# Patient Record
Sex: Female | Born: 1975 | Race: White | Hispanic: No | Marital: Married | Smoking: Never smoker
Health system: Southern US, Community
[De-identification: ages and names within clinical notes are randomized; demographics above are authoritative.]

## PROBLEM LIST (undated history)

## (undated) DIAGNOSIS — E119 Type 2 diabetes mellitus without complications: Secondary | ICD-10-CM

## (undated) DIAGNOSIS — L409 Psoriasis, unspecified: Secondary | ICD-10-CM

## (undated) DIAGNOSIS — M797 Fibromyalgia: Secondary | ICD-10-CM

## (undated) DIAGNOSIS — G43909 Migraine, unspecified, not intractable, without status migrainosus: Secondary | ICD-10-CM

## (undated) DIAGNOSIS — S82899A Other fracture of unspecified lower leg, initial encounter for closed fracture: Secondary | ICD-10-CM

## (undated) DIAGNOSIS — F32A Depression, unspecified: Secondary | ICD-10-CM

## (undated) DIAGNOSIS — R252 Cramp and spasm: Secondary | ICD-10-CM

## (undated) DIAGNOSIS — F329 Major depressive disorder, single episode, unspecified: Secondary | ICD-10-CM

## (undated) HISTORY — DX: Fibromyalgia: M79.7

## (undated) HISTORY — PX: ABDOMINAL HYSTERECTOMY: SHX81

## (undated) HISTORY — PX: GASTRIC BYPASS: SHX52

## (undated) HISTORY — DX: Major depressive disorder, single episode, unspecified: F32.9

## (undated) HISTORY — DX: Other fracture of unspecified lower leg, initial encounter for closed fracture: S82.899A

## (undated) HISTORY — DX: Cramp and spasm: R25.2

## (undated) HISTORY — DX: Type 2 diabetes mellitus without complications: E11.9

## (undated) HISTORY — DX: Psoriasis, unspecified: L40.9

## (undated) HISTORY — PX: CHOLECYSTECTOMY: SHX55

## (undated) HISTORY — PX: OTHER SURGICAL HISTORY: SHX169

## (undated) HISTORY — PX: FACET JOINT INJECTION: SHX5016

## (undated) HISTORY — DX: Migraine, unspecified, not intractable, without status migrainosus: G43.909

## (undated) HISTORY — DX: Depression, unspecified: F32.A

---

## 2006-12-17 ENCOUNTER — Ambulatory Visit: Payer: Self-pay | Admitting: Physical Medicine & Rehabilitation

## 2006-12-17 ENCOUNTER — Encounter
Admission: RE | Admit: 2006-12-17 | Discharge: 2006-12-18 | Payer: Self-pay | Admitting: Physical Medicine & Rehabilitation

## 2007-01-19 ENCOUNTER — Ambulatory Visit: Payer: Self-pay | Admitting: Physical Medicine & Rehabilitation

## 2007-01-20 ENCOUNTER — Encounter
Admission: RE | Admit: 2007-01-20 | Discharge: 2007-04-20 | Payer: Self-pay | Admitting: Physical Medicine & Rehabilitation

## 2007-02-18 ENCOUNTER — Ambulatory Visit: Payer: Self-pay | Admitting: Physical Medicine & Rehabilitation

## 2007-03-23 ENCOUNTER — Ambulatory Visit: Payer: Self-pay | Admitting: Physical Medicine & Rehabilitation

## 2007-04-23 ENCOUNTER — Ambulatory Visit: Payer: Self-pay | Admitting: Physical Medicine & Rehabilitation

## 2007-04-23 ENCOUNTER — Encounter
Admission: RE | Admit: 2007-04-23 | Discharge: 2007-07-22 | Payer: Self-pay | Admitting: Physical Medicine & Rehabilitation

## 2007-06-18 ENCOUNTER — Ambulatory Visit: Payer: Self-pay | Admitting: Physical Medicine & Rehabilitation

## 2007-07-19 ENCOUNTER — Encounter
Admission: RE | Admit: 2007-07-19 | Discharge: 2007-07-21 | Payer: Self-pay | Admitting: Physical Medicine & Rehabilitation

## 2007-07-21 ENCOUNTER — Ambulatory Visit: Payer: Self-pay | Admitting: Physical Medicine & Rehabilitation

## 2007-08-20 ENCOUNTER — Encounter: Admission: RE | Admit: 2007-08-20 | Discharge: 2007-08-24 | Payer: Self-pay | Admitting: Anesthesiology

## 2007-08-24 ENCOUNTER — Ambulatory Visit: Payer: Self-pay | Admitting: Anesthesiology

## 2007-09-30 ENCOUNTER — Encounter
Admission: RE | Admit: 2007-09-30 | Discharge: 2007-10-28 | Payer: Self-pay | Admitting: Physical Medicine & Rehabilitation

## 2007-10-01 ENCOUNTER — Ambulatory Visit: Payer: Self-pay | Admitting: Physical Medicine & Rehabilitation

## 2007-10-28 ENCOUNTER — Ambulatory Visit: Payer: Self-pay | Admitting: Physical Medicine & Rehabilitation

## 2008-01-06 ENCOUNTER — Encounter
Admission: RE | Admit: 2008-01-06 | Discharge: 2008-04-05 | Payer: Self-pay | Admitting: Physical Medicine & Rehabilitation

## 2008-01-10 ENCOUNTER — Ambulatory Visit: Payer: Self-pay | Admitting: Physical Medicine & Rehabilitation

## 2008-02-22 ENCOUNTER — Ambulatory Visit: Payer: Self-pay | Admitting: Physical Medicine & Rehabilitation

## 2008-02-28 ENCOUNTER — Ambulatory Visit: Payer: Self-pay | Admitting: Physical Medicine & Rehabilitation

## 2008-03-02 ENCOUNTER — Encounter
Admission: RE | Admit: 2008-03-02 | Discharge: 2008-03-03 | Payer: Self-pay | Admitting: Physical Medicine & Rehabilitation

## 2008-03-03 ENCOUNTER — Ambulatory Visit: Payer: Self-pay | Admitting: Physical Medicine & Rehabilitation

## 2008-04-03 ENCOUNTER — Encounter
Admission: RE | Admit: 2008-04-03 | Discharge: 2008-04-04 | Payer: Self-pay | Admitting: Physical Medicine & Rehabilitation

## 2008-04-04 ENCOUNTER — Ambulatory Visit: Payer: Self-pay | Admitting: Physical Medicine & Rehabilitation

## 2008-05-31 ENCOUNTER — Encounter
Admission: RE | Admit: 2008-05-31 | Discharge: 2008-08-29 | Payer: Self-pay | Admitting: Physical Medicine & Rehabilitation

## 2008-05-31 ENCOUNTER — Ambulatory Visit: Payer: Self-pay | Admitting: Physical Medicine & Rehabilitation

## 2008-08-16 ENCOUNTER — Ambulatory Visit: Payer: Self-pay | Admitting: Physical Medicine & Rehabilitation

## 2008-08-18 ENCOUNTER — Ambulatory Visit: Payer: Self-pay | Admitting: Diagnostic Radiology

## 2008-08-18 ENCOUNTER — Emergency Department (HOSPITAL_BASED_OUTPATIENT_CLINIC_OR_DEPARTMENT_OTHER): Admission: EM | Admit: 2008-08-18 | Discharge: 2008-08-19 | Payer: Self-pay | Admitting: Emergency Medicine

## 2008-08-29 ENCOUNTER — Encounter
Admission: RE | Admit: 2008-08-29 | Discharge: 2008-08-30 | Payer: Self-pay | Admitting: Physical Medicine & Rehabilitation

## 2008-08-30 ENCOUNTER — Ambulatory Visit: Payer: Self-pay | Admitting: Physical Medicine & Rehabilitation

## 2008-08-30 ENCOUNTER — Encounter
Admission: RE | Admit: 2008-08-30 | Discharge: 2008-08-31 | Payer: Self-pay | Admitting: Physical Medicine & Rehabilitation

## 2008-08-31 ENCOUNTER — Ambulatory Visit: Payer: Self-pay | Admitting: Physical Medicine & Rehabilitation

## 2010-09-03 NOTE — Assessment & Plan Note (Signed)
Michelle [name] is back regarding her chronic pain.  She feels the increase  in her Topamax has helped her headaches quite a bit.  She is still  having some issues with sleep somewhat, but headaches are much better.  Methadone seems to help her general pain.  Adderall seems to make her  more awake during the day.  She is still having significant electric  jolts into her legs with increased pain and weakness.  She has had some  facial pain as well with tingling there.  She denies symptoms frankly in  her arms and hands; however, of a similar nature, although the arms and  hands do hurt somewhat.  She rates her pain as an 8-9/10.  She had  lumbar facet injections per Dr. Ethelene Hal with no benefit.  Essentially, Dr.  Shelle Iron signed off on her.  She seems to be more and more limited with her  gait.  She uses a wheelchair for some distances around the house.  She  denies frank numbness in her legs nor spasms.  She is going to see the  former neurologist she has seen in the past, Dr. Alvester Morin, in December.  She  sees Dr. Corliss Skains next week for followup.   REVIEW OF SYSTEMS:  Notable for numbness, tingling, trouble walking,  spasms, dizziness, depression, anxiety, constipation, coughing,  shortness of breath.  Other pertinent positives are listed above.   SOCIAL HISTORY:  Without change.   PHYSICAL EXAMINATION:  Blood pressure is 130/79, pulse 106, respiratory  rate 18.  She is satting 100% on room air.  Patient is pleasant, alert and oriented x3.  She appears fatigued.  Her  weight is still unchanged.  We examined her legs.  There was significant pain with passive ankle  dorsiflexion and plantar flexion as well as flexion and extension of the  knees.  Pain was more prominent in the calves and quads then anywhere  else.  She had a lot of difficulty bearing weight and was hardly able to  transfer a few steps for me today.  Reflexes  were 1+ to 2+ in the four  extremities.  Sensation was inconsistent but  appeared intact in all four  to pin prick and light touch.  She had multiple tender points  throughout.  HEART:  Regular.  CHEST:  Clear.  ABDOMEN:  Soft and nontender.   ASSESSMENT:  1. Fibromyalgia syndrome.  2. Positive ANA/lupus-type syndrome.  3. History of lumbar spondylosis, degenerative disk disease, and facet      disease.  4. Obesity.  5. Depression.  6. History of pseudoseizures.  7. History of migraine headaches.  8. History of irritable bowel syndrome.  9. Insomnia.  10.Persistent parasthesias in the legs with +/- sensory loss.  When I      questioned the patient further, she felt there may be more of a      pain similar to lactic acid in the muscles when you exercise.   PLAN:  1. I think the patient needs further workup from a neurological      standpoint.  She will see Dr. Alvester Morin in December.  I told her that      she may need to be seen sooner, however.  I could potentially refer      her here locally, if she would like.  I would also like to speak      with Dr. Corliss Skains.  I would think that imaging of the brain, C and  T-spine would be appropriate, as well as EMG and LP, as part of      further w/u.  Could this be a subacute demyelinating process?      Myositis?  2. In the meantime, we increased her Topamax from 150 to 200 mg      nightly for pain and headaches.  3. Refill fentanyl patch 12 mcg.  4. Darvocet-N 100 1 q.6h. p.r.n. breakthrough pain.  5. Adderall XR 30 mg daily.  6. I will see her back in about a month.      Ranelle Oyster, M.D.  Electronically Signed     ZTS/MedQ  D:  02/19/2007 14:46:55  T:  02/20/2007 11:42:25  Job #:  [id number]   cc:   [name], M.D.  Fax: [phone]   [name]  Fax: [phone]

## 2010-09-03 NOTE — Assessment & Plan Note (Signed)
Michelle [name] is back regarding her fibromyalgia.  She started the methadone  which she has tolerated.  When she took the full 10 mg it seemed to work  much better for her than the 5, and still there were no tolerance  problems.  She did not stay on the 10 mg due to some confusion over the  prescription.  She complains of stiffness and pain today, particularly  when it is damp such as it is today.  She rates her pain as a 9 to 10  out of 10.  She describes it as dull, stabbing, constant, and aching.  She complains of pain in her mid back to low back area.  Her low back is  most prominent.  She saw Dr. Ethelene Hal at [facility] for her  back previously and had a few injections.  I never received her lumbar  MRI that was done through them last year.  I do have an old MRI dated  2007 which showed lumbar disk pathology and spondylosis.  The patient  does try to stay active with activities around the house, the yard, with  the community, etc., but remains generally limited.   REVIEW OF SYSTEMS:  Notable for the above as well as tingling in the  feet, numbness in the feet and toes, with some confusion, depression,  anxiety.  Mood has been up and down.  She does see psychiatry and  psychologists as well as a Veterinary surgeon through her church.   SOCIAL HISTORY:  Unchanged.  The patient is married.   PHYSICAL EXAMINATION:  Blood pressure is 127/76, pulse is 78,  respiratory rate 18, she is satting 98% on room air.  The patient is pleasant, generally alert.  She remains overweight, but  has lost some weight.  She has tenderness throughout the arms and legs  as well as trunk today.  Low back was tender along the lumbar  paraspinals and facets.  She had more tenderness with flexion and  extension than rotation and lateral bending.  Strength was 4 to 5 out of  5 in both lower extremities with 1+ to 2+ reflexes.  Sensation is  grossly intact.  She was able to transfer with some difficulty, but on  her  own.  She walks with a walker, and walks with a slow, wide-based  gait.  HEART:  Regular.  CHEST:  Clear.  ABDOMEN:  Soft, nontender.   ASSESSMENT:  1. Fibromyalgia.  2. Positive ANA/lupus-type syndrome.  3. Lumbar spondylosis with degenerative disk disease and likely facet      arthropathy.  4. Obesity.  5. Depression.  6. History of pseudo seizures.  7. Migraine headaches.  8. Irritable bowel syndrome.  9. Insomnia.   PLAN:  1. Continue Topamax as this is helping with headaches.  2. Will increase methadone to 10 mg b.i.d.  3. Refilled Ritalin 15 mg twice a day.  4. Encouraged ongoing activity and exercise.  5. Refilled Darvocet for breakthrough pain.  6. Will review MRI for 2008 and then consider a followup injection      treatments.  7. Follow up here pending above.      Ranelle Oyster, M.D.  Electronically Signed     ZTS/MedQ  D:  07/21/2007 12:50:37  T:  07/21/2007 13:51:41  Job #:  [id number]   cc:   Kathryne Hitch, MD  Fax: [phone]   [name]  Fax: [phone]

## 2010-09-03 NOTE — Procedures (Signed)
NAMESEVEN, MARENGO              ACCOUNT NO.:  [id number]   MEDICAL RECORD NO.:  [id number]          PATIENT TYPE:  REC   LOCATION:  [facility]                          FACILITY:  [facility]   PHYSICIAN:  Celene Kras, MD        DATE OF BIRTH:  March 19, 1976   DATE OF PROCEDURE:  08/24/2007  DATE OF DISCHARGE:                               OPERATIVE REPORT   PATIENT:  Michelle [name]   DATE OF BIRTH:  10-06-1975   SURGEON:  Jewel Baize. Stevphen Rochester, M.D.   [name] [facility].  I have  evaluated her via health and history form and 14-point review of  systems.   SUBJECTIVE:  1. Kindly referred to [place] by Dr. Riley Kill, she is an individual who comes      to [place] with low back pain, significant functional impairment      secondary to pain, difficulty with endurance, transfers, and many      issues regarding her low back.  Spondylitic pain, radicular pain is      evident.  She actually at home has to use a motorized scooter, and      she has a wheelchair today.  No advancing neurological features,      bowel and bladder intact.  Complaining of pain in the paralumbar      region with difficulty in pain control.  We are going to go ahead      and proceed to lumbar epidural as she has had some effective blocks      in the past by Dr. Ethelene Hal.  We review the MRI, which reveals L5-S1      component, and I have reviewed the risks, complications and      options.  2. She also has a spondylitic picture, and we may address the facet.      RF could be an option.  She will return to [place] for a second block in      2-3 weeks.  She will also have Dr. Riley Kill in follow-up in the      decision making.   OBJECTIVE:  Diffuse paralumbar myofascial discomfort.  Fortin test  positive.  Side bending range of motion impaired secondary to pain with  pain primarily left leg greater than right.  Nothing new neurologically.   IMPRESSION:  Spondylosis with degenerative spine disease of the lumbar  spine and radicular symptoms.   PROCEDURE:  Lumbar epidural.  She is consented.   The patient was taken to the fluoroscopy suite and placed in prone  position, the back prepped and draped in the usual fashion.  Using a  Hustead needle I advance to the L5-S1 interspace without any evidence of  CSF, heme or paresthesia.  Test block uneventfully, follow 40 with mg of  Aristocort and flush the needle.   Tolerated the procedure well.  No complications from our procedure.   PLAN:  Appropriate recovery.  Discharge instructions given.           ______________________________  Celene Kras, MD     HH/MEDQ  D:  08/24/2007 11:54:13  T:  08/24/2007 12:20:45  Job:  [id number]

## 2010-09-03 NOTE — Procedures (Signed)
[name], Michelle [name]              ACCOUNT NO.:  [id number]   MEDICAL RECORD NO.:  [id number]          PATIENT TYPE:  REC   LOCATION:  [facility]                          FACILITY:  [facility]   PHYSICIAN:  Erick Colace, M.D.DATE OF BIRTH:  09-12-1975   DATE OF PROCEDURE:  DATE OF DISCHARGE:                               OPERATIVE REPORT   PROCEDURE:  L5-S1 translaminar epidural steroid injection, left  paramedian approach.   INDICATION:  S1 radiculopathy with a herniated nucleus pulposus at L5-S1  level.  Pain is only partially responsive to medication management.   The patient was placed prone on fluoroscopy table.  Betadine prep and  sterile drape.  A 25-gauge 1-1/2-inch needle was used to anesthetize  skin and subcu tissue with 1% lidocaine x2 mL.  Then, an 18-gauge  [place] needle was inserted under fluoroscopic guidance targeting the  left side of the L5-S1 interlaminar space.  AP and lateral images were  utilized.  Once the level of the posterior elements was reached, loss-of-  resistance technique was employed, 50:50 air-saline mix.  Positive loss  of resistance obtained and confirmed with Omnipaque 180 x 1.5 mL, and a  solution containing 2 mL of 40 mg/mL Depo-Medrol and 2 mL of 1%  lidocaine and 1 mL of 0.9 normal saline injected.  After negative  drawback for blood, using IV extension tubing.  The patient tolerated  the procedure well.  Pre- and post-injection vitals stable.  Follow up  with Dr. Riley Kill.  If no significant benefit, we may consider  transforaminal approach.      Erick Colace, M.D.  Electronically Signed     AEK/MEDQ  D:  03/03/2008 14:37:28  T:  03/04/2008 05:53:39  Job:  [id number]

## 2010-09-03 NOTE — Assessment & Plan Note (Signed)
Michelle [name] is back regarding her low back pain, radiculopathy, and  fibromyalgia.  She has had some relief with the epidural steroid  injection.  She did make it to the second one as her husband mistakenly  cancelled the wrong appointment.  She has had some improvement however  in the interval.  She also missed refilling her methadone because of  this cancellation.  Her pain waxes and wanes, but overall the back is  better.  She complains of some shoulder pain as well as left knee pain.  Pain is 7-8/10, described as sharp, dull, stabbing, tingling and aching.  Her baseline regimen includes methadone 10 mg b.i.d., Darvocet-N 100 one  q.6 h. P.r.n., and Savella 50 mg b.i.d.  We increased her Topamax to 3  mg at night, which seems to have helped her sleep and headaches.  She is  on Ritalin 15 twice a day for arousal.  Pain interferes with general  activity, relationships with others, and enjoyment of life on a mild  level.  She can walk about 5-10 minutes at a time without stopping.  Activities such as bending, prolonged sitting, and standing seem to  exacerbate back and knee pain.   REVIEW OF SYSTEMS:  Notable for numbness, tingling, trouble walking,  anxiety, abdominal pain, poor appetite, nausea, vomiting, skin rash,  high sugars, weight loss.  Full review is in the written health and  history section of chart.   SOCIAL HISTORY:  Unchanged.   SOCIAL HISTORY:  Unchanged.   PHYSICAL EXAMINATION:  VITAL SIGNS:  Blood pressure is 133/77, pulse is  85, respiratory rate 18.  She is sating 96% on room air.  GENERAL:  The patient is pleasant, alert.  Affect is bright and  appropriate.  MUSCULOSKELETAL:  She walks with antalgia on the left leg, and she has a  Neoprene knee sleeve on the leg.  There is no focal muscle weakness  identified in the lower extremities.  She has a positive left straight  leg raise today.  Reflexes are 1+ at the knee and ankle.  Cognitively,  she is generally intact.   There was some pain with manipulation of the  knee and in general sense today.  There was pain in the right trapezius  and the paraspinal area along the C7-T1 levels.  NECK:  Range of motion was fair, but produced some pain with flexion as  well as rotation to the right and left today.  HEART:  Regular rate.  CHEST:  Clear.  ABDOMEN:  Soft, nontender.  She remains overweight.   ASSESSMENT:  1. Fibromyalgia.  2. History of positive ANA and lupus syndrome.  3. Lumbar disk disease with radiculopathy at L5-S1 and left-sided      symptoms.  4. History of depression, pseudoseizures, migraine headaches,      irritable bowel, and insomnia.   PLAN:  1. We will get the patient involved in outpatient physical therapy to      address range of months motion, core muscle strengthening,      lengthening stability, modalities as indicated and set the patient      up with home exercise program.  2. Refilled Ritalin 10 mg one-half b.i.d.  3. Refill methadone 10 mg b.i.d. #60 and Darvocet-N 100 one q.6 h.      p.r.n. #100.  4. Continue Savella.  5. The patient will follow up with GI regarding her nausea.  I told      her that the medication she is taking  most certainly could      contribute to her nausea, but she is convinced that it is not      related to medications.  6. Maintain Topamax 300 mg at nighttime.  7. We will see her back in a 2 months with nurse clinic followup in 1      month's time.  I did inject 2 trigger points along the right C7-T1      paraspinals, each at 2 mL of 2% lidocaine.  The patient tolerated      well.      Ranelle Oyster, M.D.  Electronically Signed     ZTS/MedQ  D:  05/31/2008 13:40:25  T:  06/01/2008 01:02:09  Job #:  [id number]   cc:   [name]  Fax: [phone]   [name], M.D.  Fax: [phone]

## 2010-09-03 NOTE — Assessment & Plan Note (Signed)
Michelle [name] is here acutely after bending over this morning and then  standing up with excruciating pain and pain radiating down her left leg.  She rates the pain at 10/10.  She states that she cannot walk on the leg  due to the pain.  She was bending down to get something out of the  shower.  She had been doing fairly well prior to this.  She denies any  focal sensory loss although she states the pain is tingling and  burning/stabbing.  She used some Darvocet per her baseline for  breakthrough symptoms as well as scheduled methadone.   REVIEW OF SYSTEMS:  Notable for the above.  Full review is in the  written health and history section of the chart and multiple items are  checked off.   SOCIAL HISTORY:  The patient is living with her family.  Husband is here  today.   PHYSICAL EXAMINATION:  VITAL SIGNS:  Blood pressure 146/93, pulse is  116, respiratory rate 18, she is satting 99% on room air.  GENERAL:  The patient is generally in distress and tearful.  She is  sitting uncomfortably in a wheelchair today.  We moved her to the exam  table and she had significant difficulty transferring and was  uncomfortable lying.  Straight leg testing was positive.  Her reflexes  were essentially 2+ of the knee and ankle.  Strength is difficult to  assess due to pain with any type of leg movement.  She had back pain and  pain radiating down the leg.  Pain seemed to be most reproducible in the  back with pressure on the left PSIS area as well as towards the iliac  crest.  She did not have any focal muscle spasm although it was  difficult to assess truly due to our limitations on exam because of the  pain today.  HEART:  Tachycardic.  CHEST:  Clear.  ABDOMEN:  Soft and nontender.   ASSESSMENT:  1. Fibromyalgia.  2. History of positive ANA/lupus syndrome.  3. History of lumbar spondylosis with degenerative disk disease and      facet arthropathy.  4. The patient with acute onset of worsening low  back and left leg      pain today, worriesome for herniated nucleus pulposus and      radiculitis.  5. See prior problem list.   PLAN:  1. The patient was given 60 mg IM Toradol today for pain relief.  2. We will give her prescriptions for prednisone taper as well as      Flexeril t.i.d. for spasm.  3. We will send her for an MRI of the lumbar spine to assess for      degenerative disk disease and radiculitis.  Suspicious for L5      involvement.  4. Encourage rest today as well as some ice to the back, if pain      subsides somewhat she may try some gentle stretching.  5. I will see her back pending MRI results.      Ranelle Oyster, M.D.  Electronically Signed     ZTS/MedQ  D:  02/28/2008 10:15:43  T:  02/28/2008 23:58:55  Job #:  [id number]

## 2010-09-03 NOTE — Procedures (Signed)
Michelle [name], Michelle [name]              ACCOUNT NO.:  [id number]   MEDICAL RECORD NO.:  [id number]          PATIENT TYPE:  REC   LOCATION:  [facility]                          FACILITY:  [facility]   PHYSICIAN:  Erick Colace, M.D.DATE OF BIRTH:  [birth year]   DATE OF PROCEDURE:  08/31/2008  DATE OF DISCHARGE:                               OPERATIVE REPORT   PROCEDURE:  A right paramedian L5-S1 translaminar lumbar epidural  steroid injection under fluoroscopic guidance.   INDICATION:  Lumbosacral radiculitis with pain only partially responsive  to medication management and interfering with self-care and mobility.   Informed consent was obtained after describing the risks and benefits of  the procedure with the patient.  These include bleeding, bruising,  infection.  She elects to proceed and has given written consent.  The  patient placed prone on fluoroscopy table.  Betadine prep, sterilely  draped.  A 25-gauge 1-1/2-inch needle was used to anesthetize the skin  and subcutaneous tissue with 1% lidocaine x2 mL, then an 18-gauge Tuohy  needle was inserted under fluoroscopic guidance targeting the inferior  lamina of right L5 paramedian.  Needle was redirected into the  interlaminar space.  Under lateral imaging and loss-of-resistance  technique, the epidural space was entered using a 50:50 air saline mix  followed by injection of 2 mL of Omnipaque 180 under live fluoro  demonstrated good epidural spread, followed by injection of 2 mL of 40  mg/mL Depo-Medrol and 2 mL of 1% MPF lidocaine.  The patient tolerated  the procedure well.  Pre- and post-injection vitals stable.  Post-  injection instructions given.      Erick Colace, M.D.  Electronically Signed     AEK/MEDQ  D:  08/31/2008 11:47:09  T:  08/31/2008 23:52:39  Job:  [id number]

## 2010-09-03 NOTE — Assessment & Plan Note (Signed)
Michelle [name] is back regarding her fibromyalgia.  We sent her for lumbar  epidural injection last month to Dr. Stevphen Rochester.  Unfortunately, the patient  states that her sugar bottomed down after surgery, and she was in the  emergency department.  She now complaints of spells with uncoordinated  symptoms and headaches.  She has had 3 spells in 4 weeks' time.  Her  family physician thought it may be due her Effexor.  The patient rates  her pain at 8-9/10.  She describes as a dull, stabbing, constant, and  aching.  Pain interferes with her general activity, relations with  others, and enjoyment of life on a severe level.  Sleep is poor.  She  does try to stay active somewhat doing things around the house.  She got  a V-Fit and is doing some yoga with that.  She is not getting much in  the way of aerobic activities.   REVIEW OF SYSTEMS:  Remarkable for tremor, tingling, weakness, trouble  walking, dizziness, confusion, and depression.  Other pertinent  positives are as above.   SOCIAL HISTORY:  Without significant change.   PHYSICAL EXAMINATION:  Blood pressure is 123/85, pulse 83, respiratory  rate 20, and she is sating 98% on room.  The patient is pleasant and  generally alert.  She remains overweight.  She has some tenderness  throughout particularly in the legs today with pain at the tibial  prominence on the right, as well as lateral calves.  She has some tight  areas of muscle along the tibialis anterior bilaterally.  Strength was 4-  5/5 with some fluctuation.  Reflexes are 1+ to 2+.  Sensations are  intact.  Gait is generally stable but slightly wide-based.  Heart is  regular.  Chest is clear.  Abdomen is soft and nontender.   ASSESSMENT:  1. Fibromyalgia.  2. Positive antinuclear antibody/lupus syndrome.  3. Lumbar spondylosis with degenerative disk disease and facet      arthropathy.  4. Obesity.  5. Depression.  6. History of pseudoseizures.  7. Migraine headaches.  8. Irritable  bowel syndrome.  9. Insomnia.   PLAN:  1. Some of her dizzy/uncoordinated spells may be related to her      Topamax.  I think we can look at backing off this down to 150 to      100 mg q.h.s., granted that her headaches are still controlled at a      lower dose.  2. Continue methadone 10 mg b.i.d. and Ritalin 15 mg twice a day.  3. We will taper off Effexor and begin a trial of Savella 15 mg b.i.d.      She was given a starter pack today.  4. I encouraged aerobic exercise and core muscle stretching and      strengthening.  5. Darvocet for breakthrough pain.  6. I encouraged her to decrease the Topamax once she is up and going      on the [facility] so that if she does have a problem with the      decrease, we will know that it is related to the Topamax      particularly versus starting on the      [facility].  7. I will see her back in 2 months with nursing clinic followup in 1      month's time.      Ranelle Oyster, M.D.  Electronically Signed     ZTS/MedQ  D:  10/01/2007 14:41:32  T:  10/02/2007 07:51:03  Job #:  [id number]   cc:   [name], M.D.  Fax: [phone]   [name]  Fax: [phone]

## 2010-09-03 NOTE — Assessment & Plan Note (Signed)
Michelle [name] is back regarding her pain.  The pain has not changed a lot  since I last saw her.  She did see Michelle [name], here in town, regarding  her increasing pain and numbness, and he did not find any obvious  neurological deficits.  He did order a CT of her head and neck, which  according to the patient, were normal.  I have no information from his  office to corroborate that, although I did speak with him briefly the  other day, in passing.  The patient's workup through Dr. Corliss [name]  revealed some vitamin D deficiency and her thyroid hormone apparently  was a bit high and now she is on supplementation and has had her thyroid  medication adjusted.  She continues to see her psychiatrist on a bi-  weekly basis for counseling and medication adjustment.  She rates her  pain, overall, a 9/10.  She sleeps poorly.  Pain is diffuse from head to  toe, particularly in the lower extremities, with burning, tingling pains  noted in the legs, hands and arms.   REVIEW OF SYSTEMS:  Patient reports multiple problems, including those  mentioned above, as well as dizziness, depression, anxiety, loss of  taste, poor appetite, constipation, nausea, coughing.  Full review is in  the written health history section in the chart.   SOCIAL HISTORY:  Without significant change.   PHYSICAL EXAM:  Blood pressure is 117/74, pulse is 90, respiratory rate  is 18, she is satting 100% on room air.  The patient is pleasant, appears a bit fatigued.  Affect is flat.  She  remains overweight.  She continues to have significant pain with all  movements today, particularly in the lower extremities.  She had pain to  the touch in the arms, legs, trunk, neck and head.  Sensation remained  inconsistent.  Reflexes were 1+ to 2+ throughout.  Heart was regular.  Chest was clear.  Abdomen soft, nontender.   ASSESSMENT:  1. Fibromyalgia syndrome.  2. Positive ANA/lupus type syndrome.  3. History of lumbar spondylosis, DDD and  facet disease.  4. Obesity.  5. Depression.  6. History of pseudoseizures.  7. History of migraine headaches.  8. History of irritable bowel syndrome.  9. Insomnia.   PLAN:  1. We will await a followup note from Dr. Anne [name], although there      appear to be no true neurological findings on his exam.  There is      some question of nonorganic sources of her pain.  Certainly, her      pain could be related to her fibromyalgia syndrome.  Patient tells      me she had a nerve conduction study done in the remote past and I      wonder if it would not be out of the realm of reason to look at      pursuing one of these in the next month or two, considering her      history of diabetes.  2. In the meantime, the Topamax 200 mg at h.s.  3. We will begin Requip 0.5 mg one to two q.h.s. for pain and sleep      deprivation.  4. Refilled Darvocet.  5. Increased her fentanyl patch to 25 micrograms for pain control.  6. Refilled Adderall XR 30 mg daily.  7. Continue vitamin D supplementation per Dr. Corliss [name].  8. I will see her back in about a month's time.      Michelle Coder  Ermalene [name], M.D.  Electronically Signed     ZTS/MedQ  D:  03/24/2007 13:44:41  T:  03/24/2007 16:45:43  Job #:  [id number]   cc:   Michelle [name], M.D.  Fax: [phone]   Michelle [name]  Fax: [phone]   Michelle [name], M.D.  Fax: [phone]

## 2010-09-03 NOTE — Assessment & Plan Note (Signed)
Michelle [name] is back regarding her fibromyalgia.  She was unable to get the  fentanyl or the Requip filled due to financial reasons.  Her husband was  out of work for 2 weeks.  She had done some walking around the house as  I had asked her to do but has had some falls.  Upon questioning her, she  was walking all of the time without her walker.  She does use the  walker.  She does use the walker for longer distances outside the home.  She rates her pain at 8/10.  She is using Darvocet to make up for the  pain.  She is taking the Topamax again, which helps her headaches  although she is only on 100 mg at night due to the fact that she can  stretch out the medication longer this way.  Patient describes her sleep  still as poor.  She has stabbing, sharp, burning, constant tingling and  aching all throughout, particularly in the legs.  She saw a podiatrist  who recommended diabetes shoes for her and diagnosed her with  peripheral neuropathy.   REVIEW OF SYSTEMS:  Patient reports numbness, tingling, trouble walking,  dizziness, depression, anxiety.  Other pertinent positives are listed  above and full review is in the history section of the chart.   SOCIAL HISTORY:  The patient is married.  Other issues are noted above.   PHYSICAL EXAMINATION:  VITAL SIGNS:  Blood pressure 119/58, pulse 83,  respiratory rate 18.  Oxygen saturation is 100% on room air.  GENERAL:  Patient is pleasant.  She is much more alert today.  She  remains overweight.  Her hair is dyed dark today, which is a stark  contrast for her compared to past presentations.  MUSCULOSKELETAL:  She has pain throughout to light touch, manipulation  of the arms, legs, trunk, pelvis, etc.  Sensation was still  inconsistent.  Reflexes were 1+.  She walked with her walker today and  was fairly stable, although tended to want to lean posteriorly a bit.  She seemed to be moving better than I have seen her over the last 2  months.  HEART:   Regular.  CHEST:  Clear.  ABDOMEN:  Soft and nontender.   ASSESSMENT:  1. Fibromyalgia syndrome.  2. Positive ANA/lupus type syndrome.  3. History of lumbar spondylosis, degenerative disk disease and facet      arthropathy.  4. Obesity.  5. Depression.  6. Pseudoseizures.  7. Migraine headaches.  8. Irritable bowel syndrome.  9. Insomnia.   PLAN:  1. Encouraged ongoing activity and ambulation.  She needs to use her      walker at all times when up.  2. Take Topamax for generalized pain and headaches.  3. Hold on Requip for now.  4. Will replace fentanyl with low-dose methadone 5 mg twice daily for      1 week then up to 10 mg thereafter. The goal would be to increase      her activity tolerance.  5. Darvocet for breakthrough pain.  6. Change Adderall to Ritalin 10 mg one and a half daily at 7:00 and      12 noon.  7. I will see her back in about 2 months.  She will see the nurse      clinic back in 1 month.      Ranelle Oyster, M.D.  Electronically Signed     ZTS/MedQ  D:  04/26/2007 11:13:33  T:  04/26/2007 12:13:00  Job #:  [id number]   cc:   Kathryne Hitch, MD  Fax: [phone]   [name]  Fax: [phone]

## 2010-09-03 NOTE — Assessment & Plan Note (Signed)
Thresea is back regarding her back pain and radiculopathy.  She had a L5-  S1 left paramedian epidural injection by Dr. Wynn Banker on March 03, 2008.  She had excellent relief of this for a few weeks over the last  week or so.  Pain seems to be increasing again, although, not to the  point where it was when I saw her several weeks ago.  She rates her pain  in average of 8/10, described as sharp, stabbing, tingling, and aching.  Pain interferes with her general activity, in relationship with others,  and enjoyment of life on a severe level.  Her Oswestry score was 82%  today.  Sleep is poor.  She has problems with mobility due to pain.  Pain is mostly down to back in her left leg.  She has had some stress of  removing other family things going on at the movement, which she hopes  will be resolved over the next week or so.  She is using methadone 10 mg  b.i.d. as well as Topamax 200 mg at bedtime.  She is on Ritalin twice a  day for arousal and is on Darvocet for breakthrough pain.  ReQuip at  night for restless leg.  She uses Flexeril for spasms.  She had some  problems with blood sugar lability after the last injection, but these  are tolerable as long as she stayed consistent with diet.   REVIEW OF SYSTEMS:  Notable for multiple items including dizziness,  fever, due to bronchitis, nausea, poor appetite, wheezing, and shortness  of breath.  Full review is in the written health and history section.   SOCIAL HISTORY:  The patient is unchanged and other pertinent positives  are above.   PHYSICAL EXAMINATION:  VITAL SIGNS:  Blood pressure is 125/77, pulse is  104, respiratory rate 18, and she is sating 99% on room air.  GENERAL:  The patient appears much more upbeat than I saw her last time.  She still has positive straight leg raise in the left leg and lot of  pain in addition with movement of her leg in general.  EXTREMITIES:  Reflexes are 1+ at the knee and ankle.  She still has  some  variable motor scores today and effort is variable.  Cognitively, she is  intact.  HEART:  Tachycardic.  CHEST:  Clear.  ABDOMEN:  Soft and nontender.  She remains overweight.   ASSESSMENT:  1. Fibromyalgia.  2. History of positive antinuclear antibody/lupus syndrome.  3. Lumbar disk disease with radiculopathy at L5-S1.  Symptoms are left      sided.  4. History of depression, pseudoseizures, migraine headaches,      irritable bowel syndrome, and insomnia.   PLAN:  1. We will send the patient back to Dr. Wynn Banker for a second      epidural injection.  She seemed to have a positive results with the      first.  We may need even look at the third depending on course.  I      ultimately like to send her to physical therapy to work on posture,      modalities, core muscle strengthening, and home exercise program.      Therapy and HEP will be the foundation of her long term outcome.  2. Refill methadone 10 mg b.i.d. and Darvocet-N 100 one q.6 h. p.r.n.      for breakthrough pain.  3. Continue Savella 50 mg b.i.d.  4. We will  increase Topamax to 300 mg at bedtime, though she has had      no problems tolerating this thus far.  5. Ritalin 15 mg twice a day for arousal.  6. I will see her back, pending the above.      Ranelle Oyster, M.D.  Electronically Signed     ZTS/MedQ  D:  04/04/2008 10:14:39  T:  04/05/2008 03:32:24  Job #:  [id number]   cc:   [name]  Fax: [phone]   [name], M.D.  Fax: [phone]

## 2010-09-03 NOTE — Assessment & Plan Note (Signed)
Michelle [name] is back regarding her chronic pain. We reviewed multiple  areas at last visit. I had increased her Adderall and added long acting  fentanyl for pain control. She says that the Adderall has not made a big  difference although the Fentanyl has helped her a bit with her overall  pain. We had gone to 50 mg of Topamax, but she felt sedated from this  and we went back down to the 25 mg. The Topamax is still helping her  headaches. DHEA may be helping her daytime energy. She had facet blocks  at [facility] and noted a day or two of relief with those.  She sees Dr. Ethelene Hal next week apparently. Voltaren gel helped somewhat  with tender areas on a transient basis. We have not pursued therapy yet.  She has had a lot of psychosocial issues going on causing extra stress  at home. She rates her pain today at an 8/10 and describes it as  burning, stabbing and constant tingling and aching. The pain interferes  with general activities, relations with others and enjoyment of life on  a moderate to severe level. She is having a bad day today. However,  she states that she has had more good days than she has had previously.  Sleep is poor at times.   REVIEW OF SYSTEMS:  Notable for multiple items including weakness,  tingling, dizziness, spasm, trouble walking, anxiety and depression. She  uses her walker for balance. She has some night sweats sometimes  associated she thinks with the fentanyl patches. She complains of some  nausea, diarrhea and constipation at times as well as decreased  appetite. Full review is in written Health and History section.   SOCIAL HISTORY:  As noted above. The patient is married and had a lot of  stressors as of late.   PHYSICAL EXAMINATION:  Blood pressure 122/81, pulse 87, respiratory rate  is 18. She is satting at 99% on room air.  The patient is pleasant,  alert and oriented x3. Affect is bright and appropriate. She appears  very tired. She walks  with a hunched posture using her rolling walker.  She had difficulty changing positions. She did not need assistance for  transfers however, although she did need extra time. She had multiple  tender points throughout the knees, elbows, hips, low back, shoulders,  neck, etc... Is limited with lumbar flexion, extension and rotation  today on examination. She has pain with palpation there as well.  Extension caused significant pain particularly. Knees were somewhat  tender to palpation. She has multiple tender points throughout.  Cognitively she was appropriate although appeared very fatigued. Mood  was pleasant nonetheless.  HEART: Was regular.  CHEST: Clear.  ABDOMEN: Soft and nontender.  Weight was unchanged.   ASSESSMENT:  1. Fibromyalgia syndrome.  2. Positive ANA/lupus type syndrome.  3. Lumbar spondylosis/facet syndrome.  4. Obesity.  5. Depression.  6. History of pseudoseizures.  7. History of migraine headaches.  8. Irritable bowel syndrome.  9. Insomnia.   PLAN:  1. I hesitate increasing medications at this point seeing her fatigue      today. The fentanyl has helped her so far, therefore I would like      to keep it at the 12.5 mcg dose. We will try to increase the DHEA      to 50 mg daily and increase her Adderall to 30 mg daily.  2. Await Dr.  Ethelene Hal plan for injections.  3. I  would like to consider starting therapy some time soon to work on      her general physical status and low back.  4. I gave the patient permission to increase her Topamax at some point      over the next 1-2 weeks time back to 50 mcg to see if this benefits      her headaches. She did say that although the 50 mcg caused her to      be sedated it did substantially help her migraine pain.   I will see the patient back in about one months' time.      Ranelle Oyster, M.D.  Electronically Signed     ZTS/MedQ  D:  01/20/2007 12:48:23  T:  01/20/2007 19:53:30  Job #:  [id number]   cc:    Kathryne Hitch, MD  Fax: [phone]   [name]  Fax: [phone]

## 2010-09-03 NOTE — Assessment & Plan Note (Signed)
Michelle [name] is back regarding her fibromyalgia and generalized pain.  She  missed her appointment last month.  She has been out of her methadone,  Savella, and Ritalin as well as Darvocet since then.  Pain has been  increased.  She has recently moved and been doing a lot of packing and  moving and that has also increased her pain.  She has had some stresses  at home as well.  She rates her pain at 9/10.  Her biggest problem area  is her left shoulder and the low back.  Apparently, she had a recent x-  ray of her back and was told she had degenerative disk disease.   We have tried an epidural previously on her without good benefits.  I  think her blood pressure bottomed out after the injection.  We began  Forman in June and she has noticed no difference with the medication  and in fact ran out of it 3 weeks ago and did not restart.   Family doctor restarted Lyrica low dose.  She is on 75 mg nightly  currently with goal b.i.d. dosing.  She is on her Effexor still 75 mg  daily.   REVIEW OF SYSTEMS:  Notable for tingling, pain, nausea, occasional  wheezing, shortness of breath, and coughing.  Other pertinent positives  are above and full review is in the written health and history section  of the chart.   SOCIAL HISTORY:  Without significant change.   PHYSICAL EXAMINATION:  VITAL SIGNS:  Blood pressure is 143/90, pulse is  88, respiratory rate 18, and she is saturating a 100% on room air.  The  patient remains overweight.  No specific changes were noted today in her  appearance overall.  She has pain in the back along the lumbar  paraspinals, the facets with flexion and extension.  I felt extension  may have been more provoking today.  Both legs were sensitive to touch  but strength was 4-5/5 with some variability.  Left shoulder is notably  along the long head of the biceps tendon.  Rotator cuff maneuver was  equivocal.  Cognitively, she is generally intact.  She was not anxious  nor  irritable today.  HEART:  Regular.  CHEST:  Clear.  ABDOMEN:  Soft and nontender.   ASSESSMENT:  1. Fibromyalgia.  2. Positive antinuclear antibody/lupus syndrome.  3. Lumbar spondylosis with documented degenerative disk disease and      facet arthropathy.  4. Obesity.  5. Depression.  6. History of pseudoseizures.  7. Migraine headaches.  8. Irritable bowel syndrome.  9. Insomnia.  10.Left biceps tendinitis.   PLAN:  1. After an informed consent, we injected the left long head of the      biceps tendon with 40 mg of Kenalog and 3 mL 1% lidocaine.  2. Refill methadone 10 b.i.d. and Ritalin 15 mg twice a day.  3. Refill Darvocet-N 100 one q.6 h. p.r.n.  4. Continue Effexor 75 mg daily.  5. Lyrica per family physician.  6. We will see her back in about 3 months with nurse clinic followup      in 1 month's time.  I think a lot of her      recent exacerbation is due to her mood as well as coming off these      medications.  I told her the risks of withdrawal and other      complications if she should suddenly decide to stop or not refill  narcotics again.      Ranelle Oyster, M.D.  Electronically Signed     ZTS/MedQ  D:  01/10/2008 12:21:42  T:  01/11/2008 02:25:12  Job #:  [id number]   cc:   [name], M.D.  Fax: [phone]
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
## 2013-09-29 ENCOUNTER — Encounter (INDEPENDENT_AMBULATORY_CARE_PROVIDER_SITE_OTHER): Payer: Self-pay

## 2013-09-29 ENCOUNTER — Encounter: Payer: Self-pay | Admitting: Neurology

## 2013-09-29 ENCOUNTER — Ambulatory Visit (INDEPENDENT_AMBULATORY_CARE_PROVIDER_SITE_OTHER): Payer: BC Managed Care – PPO | Admitting: Neurology

## 2013-09-29 VITALS — BP 100/70 | HR 73 | Ht 64.0 in | Wt 127.0 lb

## 2013-09-29 DIAGNOSIS — F3289 Other specified depressive episodes: Secondary | ICD-10-CM

## 2013-09-29 DIAGNOSIS — E119 Type 2 diabetes mellitus without complications: Secondary | ICD-10-CM | POA: Insufficient documentation

## 2013-09-29 DIAGNOSIS — F329 Major depressive disorder, single episode, unspecified: Secondary | ICD-10-CM | POA: Insufficient documentation

## 2013-09-29 DIAGNOSIS — F32A Depression, unspecified: Secondary | ICD-10-CM | POA: Insufficient documentation

## 2013-09-29 DIAGNOSIS — G43909 Migraine, unspecified, not intractable, without status migrainosus: Secondary | ICD-10-CM

## 2013-09-29 DIAGNOSIS — IMO0001 Reserved for inherently not codable concepts without codable children: Secondary | ICD-10-CM

## 2013-09-29 DIAGNOSIS — R252 Cramp and spasm: Secondary | ICD-10-CM

## 2013-09-29 DIAGNOSIS — M797 Fibromyalgia: Secondary | ICD-10-CM | POA: Insufficient documentation

## 2013-09-29 MED ORDER — OXCARBAZEPINE 150 MG PO TABS: ORAL_TABLET | ORAL | Status: DC

## 2013-09-29 NOTE — Progress Notes (Signed)
PATIENT: Michelle [name] DOB: 07-Mar-1976  HISTORICAL  Michelle [name] is 38 years old right-handed female, referred by her rheumatologist Dr. Corliss Skains for evaluation of muscle cramping.  She had a past medical history of psoriasis, psoriatic arthritis, was under the care of Dr. Corliss Skains, was treated with Humira also plaquenil 200mg  bid. She also has past medical history of obesity, status post gastric bypass surgery in 2011, with more than 100 weight loss, diagnosis of iron deficiency anemia, had iron infusion in May 2015, she also had vitamin D deficiency, is receiving by mouth, and IM supplement  She works part-time as a Water quality scientist, in the middle of May 2015, around 4 AM, while doing finger stick on a patient, she suddenly noticed right-handed muscle spasm, right thumb forceful adduction with cupping of her right hand, very painful, followed by numbness, lasting for 5 minutes, there was no right arm weakness, no dysarthria, no right leg involvement, she has no loss of consciousness, 5 minutes later, she was able to move her right hand again, but she complains of deep achy pain, numbness, lasting about 2 hours,  Few days later, she had sudden onset forceful left ankle plantarflexion, very painful, lasting for 10 minutes, followed by left leg pain, difficulty bearing weight,  A week later, she had another episodes of forceful left ankle eversion, lasting 20 minutes, again followed by left leg pain, difficulty walking with her left leg, she has to use a wheelchair,  Most recent episode was right ankle plantar flexion, lasting 5-10 minutes, followed by right leg pain,  She has chronic midline low back pain, neck pain, but no radiating pain to lower extremity, all upper extremity, she has occasionally bilateral fingertips paresthesia, she carried a diagnosis of interstitial cystitis, difficulty knee she detouring sometimes, has to do self catheter intermittently since 2014,  She is also under  pain management for her psoriatic arthritis, diffuse body aching pain, has tried gabapentin, Lyrica in the past, all cause worsening urinary retention,    she is on polypharmacy treatment already, including clonazepam 0 point 2 5 mg as needed, Flexeril 10 mg 3 times a day, Cymbalta 30 mg every day, hydrocodone as needed,  Kadian (morphine) extended release 60 mg in the morning, 30 mg at evening, meloxicam.5 a day,  Laboratory June third 20/15, showed a normal CBC, May 11 20/15, with decreased RBC 3.8 2, decreased hemoglobin 10.9, decreased hematocrit at 33 daughter one, mild elevated ALT 68  REVIEW OF SYSTEMS: Full 14 system review of systems performed and notable only for fatigue, sweating that, ringing ears, blurry vision, urination problem, anemia, easy bruising, joint pain, swelling, cramps, achy muscles, headaches, numbness weakness dizziness   ALLERGIES: Allergies  Allergen Reactions  . Latex   . Tape     HOME MEDICATIONS: No current outpatient prescriptions on file prior to visit.   No current facility-administered medications on file prior to visit.    PAST MEDICAL HISTORY: Past Medical History  Diagnosis Date  . Migraine   . Diabetes   . Migraine   . Psoriasis, psoriatic arthritis  PAST SURGICAL HISTORY: C section,  Hysterectomy for abnormal PAP smear, large ovarian cyst Gastric bypass in 03/2010, lose 115 weight loss. Cholecystectomy  FAMILY HISTORY: Father 44: Colon, lung, skin cancer Mother 16 : breast, stomach cancer with liver metstasis  SOCIAL HISTORY:  History   Social History  . Marital Status: Married    Spouse Name: N/A    Number of Children: 2  . Years of  Education: N/A   Occupational History  . Disability will    Social History Main Topics  . Smoking status: Not on file  . Smokeless tobacco: Not on file  . Alcohol Use: Not on file  . Drug Use: Not on file  . Sexual Activity: Not on file   Other Topics Concern  . Not on file   Social  History Narrative  . No narrative on file    PHYSICAL EXAM   Filed Vitals:   09/29/13 0815  BP: 100/70  Pulse: 73  Height: 5\' 4"  (1.626 m)  Weight: 127 lb (57.607 kg)    Not recorded    Body mass index is 21.79 kg/(m^2).   Generalized: In no acute distress  Neck: Supple, no carotid bruits   Cardiac: Regular rate rhythm  Pulmonary: Clear to auscultation bilaterally  Musculoskeletal: No deformity  Neurological examination  Mentation: Alert oriented to time, place, history taking, and causual conversation  Cranial nerve II-XII: Pupils were equal round reactive to light. Extraocular movements were full.  Visual field were full on confrontational test. Bilateral fundi were sharp.  Facial sensation and strength were normal. Hearing was intact to finger rubbing bilaterally. Uvula tongue midline.  Head turning and shoulder shrug and were normal and symmetric.Tongue protrusion into cheek strength was normal.  Motor: Normal tone, bulk and strength.  Sensory: Intact to fine touch, pinprick, preserved vibratory sensation, and proprioception at toes.  Coordination: Normal finger to nose, heel-to-shin bilaterally there was no truncal ataxia  Gait: Rising up from seated position without assistance, normal stance, without trunk ataxia, moderate stride, good arm swing, smooth turning, able to perform tiptoe, and heel walking without difficulty.   Romberg signs: Negative  Deep tendon reflexes: Brachioradialis 2/2, biceps 2/2, triceps 2/2, patellar 2/2, Achilles 2/2, plantar responses were flexor bilaterally.   DIAGNOSTIC DATA (LABS, IMAGING, TESTING) - I reviewed patient records, labs, notes, testing and imaging myself where available.  ASSESSMENT AND PLAN  Michelle [name] is a 38 y.o. female with complicated past medical history, including psoriasis, psoriatic arthritis, status post gastric bypass, Ion deficiency, vitamin D deficiency, chronic pain, on chronic polypharmacy  treatment, now presenting with intermittent painful muscle spasm, involving different muscle groups,  1, differentiation diagnosis including electrolyte imbalance, nutritional deficiency, 2, other possibility including central nervous system etiology, such as partial seizure, cervical myelopathy 3. Proceed with laboratory evaluations, MRI of brain, cervical spine  4. Trileptal 150 mg twice a day 5, return to clinic in one month   Levert FeinsteinYijun Imani Fiebelkorn, M.D. Ph.D.  [facility] [address], [address] [place], [place] [address] [phone]

## 2013-09-30 ENCOUNTER — Telehealth: Payer: Self-pay | Admitting: Neurology

## 2013-09-30 NOTE — Telephone Encounter (Signed)
I fail to reach patient, Please call patient, Vit B12 263, she needs overcounter vit B12 supplement, Elev TSH, indicating under supplement of levothyroxine, I have fax the result to her pcp, she should continue follow up with her PCP for medication adjustment

## 2013-10-03 LAB — CBC WITH DIFFERENTIAL
BASOS: 0 %
Basophils Absolute: 0 10*3/uL (ref 0.0–0.2)
EOS: 1 %
Eosinophils Absolute: 0 10*3/uL (ref 0.0–0.4)
HCT: 38.1 % (ref 34.0–46.6)
Hemoglobin: 12.3 g/dL (ref 11.1–15.9)
IMMATURE GRANS (ABS): 0 10*3/uL (ref 0.0–0.1)
Immature Granulocytes: 0 %
LYMPHS: 27 %
Lymphocytes Absolute: 1.3 10*3/uL (ref 0.7–3.1)
MCH: 29.8 pg (ref 26.6–33.0)
MCHC: 32.3 g/dL (ref 31.5–35.7)
MCV: 92 fL (ref 79–97)
MONOCYTES: 8 %
Monocytes Absolute: 0.4 10*3/uL (ref 0.1–0.9)
NEUTROS ABS: 2.9 10*3/uL (ref 1.4–7.0)
Neutrophils Relative %: 64 %
Platelets: 184 10*3/uL (ref 150–379)
RBC: 4.13 x10E6/uL (ref 3.77–5.28)
RDW: 17.1 % — ABNORMAL HIGH (ref 12.3–15.4)
WBC: 4.6 10*3/uL (ref 3.4–10.8)

## 2013-10-03 LAB — COMPREHENSIVE METABOLIC PANEL
ALBUMIN: 4.4 g/dL (ref 3.5–5.5)
ALK PHOS: 72 IU/L (ref 39–117)
ALT: 44 IU/L — ABNORMAL HIGH (ref 0–32)
AST: 30 IU/L (ref 0–40)
Albumin/Globulin Ratio: 2.1 (ref 1.1–2.5)
BILIRUBIN TOTAL: 0.3 mg/dL (ref 0.0–1.2)
BUN / CREAT RATIO: 15 (ref 9–23)
BUN: 9 mg/dL (ref 6–24)
CO2: 25 mmol/L (ref 18–29)
CREATININE: 0.6 mg/dL (ref 0.57–1.00)
Calcium: 9.1 mg/dL (ref 8.7–10.2)
Chloride: 101 mmol/L (ref 97–108)
GFR, EST AFRICAN AMERICAN: 132 mL/min/{1.73_m2} (ref >59)
GFR, EST NON AFRICAN AMERICAN: 114 mL/min/{1.73_m2} (ref >59)
GLOBULIN, TOTAL: 2.1 g/dL (ref 1.5–4.5)
GLUCOSE: 90 mg/dL (ref 65–99)
Potassium: 3.8 mmol/L (ref 3.5–5.2)
Sodium: 142 mmol/L (ref 134–144)
TOTAL PROTEIN: 6.5 g/dL (ref 6.0–8.5)

## 2013-10-03 LAB — PROTEIN ELECTROPHORESIS
A/G Ratio: 1.7 (ref 0.7–2.0)
ALPHA 2: 0.6 g/dL (ref 0.4–1.2)
Albumin ELP: 4.1 g/dL (ref 3.2–5.6)
Alpha 1: 0.2 g/dL (ref 0.1–0.4)
BETA: 0.9 g/dL (ref 0.6–1.3)
GLOBULIN, TOTAL: 2.4 g/dL (ref 2.0–4.5)
Gamma Globulin: 0.8 g/dL (ref 0.5–1.6)

## 2013-10-03 LAB — C-REACTIVE PROTEIN: CRP: 0.1 mg/L (ref 0.0–4.9)

## 2013-10-03 LAB — IRON AND TIBC
IRON: 64 ug/dL (ref 35–155)
Iron Saturation: 20 % (ref 15–55)
TIBC: 318 ug/dL (ref 250–450)
UIBC: 254 ug/dL (ref 150–375)

## 2013-10-03 LAB — THYROID PANEL WITH TSH
FREE THYROXINE INDEX: 2.4 (ref 1.2–4.9)
T3 Uptake Ratio: 31 % (ref 24–39)
T4 TOTAL: 7.9 ug/dL (ref 4.5–12.0)
TSH: 5.95 u[IU]/mL — AB (ref 0.450–4.500)

## 2013-10-03 LAB — RPR: RPR: NONREACTIVE

## 2013-10-03 LAB — SEDIMENTATION RATE: Sed Rate: 2 mm/hr (ref 0–32)

## 2013-10-03 LAB — LYME, TOTAL AB TEST/REFLEX

## 2013-10-03 LAB — VITAMIN D 1,25 DIHYDROXY: Vit D, 1,25-Dihydroxy: 81.7 pg/mL — ABNORMAL HIGH (ref 19.9–79.3)

## 2013-10-03 LAB — CK: CK TOTAL: 63 U/L (ref 24–173)

## 2013-10-03 LAB — COPPER, SERUM: Copper: 93 ug/dL (ref 72–166)

## 2013-10-03 LAB — FERRITIN: Ferritin: 120 ng/mL (ref 15–150)

## 2013-10-03 LAB — FOLATE: Folate: 17.6 ng/mL (ref >3.0)

## 2013-10-03 LAB — VITAMIN B12: Vitamin B-12: 263 pg/mL (ref 211–946)

## 2013-10-03 LAB — VITAMIN B1, WHOLE BLOOD: Thiamine: 119.9 nmol/L (ref 66.5–200.0)

## 2013-10-03 LAB — HIV ANTIBODY (ROUTINE TESTING W REFLEX): HIV-1/HIV-2 Ab: NONREACTIVE

## 2013-10-03 NOTE — Progress Notes (Signed)
Quick Note:  Shared normal labs with patient, she verbalized understanding ______ 

## 2013-10-04 NOTE — Telephone Encounter (Signed)
Spoke to patient and relayed Vit B12 results, and elevated TSH, per Dr. Terrace ArabiaYan.  Told her to follow up with PCP for medication adjustment on levothyroxine and to take B12 over the counter supplement, per Dr. Terrace ArabiaYan.  Patient expressed understanding.

## 2013-10-13 ENCOUNTER — Ambulatory Visit
Admission: RE | Admit: 2013-10-13 | Discharge: 2013-10-13 | Disposition: A | Discharge status: Home / Self Care | Payer: BC Managed Care – PPO | Source: Ambulatory Visit | Attending: Neurology | Admitting: Neurology

## 2013-10-13 DIAGNOSIS — G43909 Migraine, unspecified, not intractable, without status migrainosus: Secondary | ICD-10-CM

## 2013-10-13 DIAGNOSIS — E119 Type 2 diabetes mellitus without complications: Secondary | ICD-10-CM

## 2013-10-13 DIAGNOSIS — F32A Depression, unspecified: Secondary | ICD-10-CM

## 2013-10-13 DIAGNOSIS — R252 Cramp and spasm: Secondary | ICD-10-CM

## 2013-10-13 DIAGNOSIS — F329 Major depressive disorder, single episode, unspecified: Secondary | ICD-10-CM

## 2013-10-13 DIAGNOSIS — M797 Fibromyalgia: Secondary | ICD-10-CM

## 2013-10-13 MED ORDER — GADOBENATE DIMEGLUMINE 529 MG/ML IV SOLN
10.0000 mL | Freq: Once | INTRAVENOUS | Status: AC | PRN
  Administered 2013-10-13: 10 mL via INTRAVENOUS

## 2013-10-17 NOTE — Progress Notes (Signed)
Quick Note:  Shared normal MR brain with patient, she verbalized understanding ______

## 2013-10-17 NOTE — Progress Notes (Signed)
Quick Note:  Shared normal MRI cervical results with patient and she verbalized understanding ______

## 2013-11-04 ENCOUNTER — Encounter (INDEPENDENT_AMBULATORY_CARE_PROVIDER_SITE_OTHER): Payer: Self-pay

## 2013-11-04 ENCOUNTER — Encounter: Payer: Self-pay | Admitting: Neurology

## 2013-11-04 ENCOUNTER — Ambulatory Visit (INDEPENDENT_AMBULATORY_CARE_PROVIDER_SITE_OTHER): Payer: BC Managed Care – PPO | Admitting: Neurology

## 2013-11-04 VITALS — BP 94/65 | HR 78 | Ht 64.0 in | Wt 128.0 lb

## 2013-11-04 DIAGNOSIS — G43909 Migraine, unspecified, not intractable, without status migrainosus: Secondary | ICD-10-CM

## 2013-11-04 DIAGNOSIS — M797 Fibromyalgia: Secondary | ICD-10-CM

## 2013-11-04 DIAGNOSIS — IMO0001 Reserved for inherently not codable concepts without codable children: Secondary | ICD-10-CM

## 2013-11-04 DIAGNOSIS — R252 Cramp and spasm: Secondary | ICD-10-CM

## 2013-11-04 MED ORDER — OXCARBAZEPINE 150 MG PO TABS: ORAL_TABLET | ORAL | Status: DC

## 2013-11-04 NOTE — Progress Notes (Signed)
PATIENT: Michelle [name] DOB: [birth year]  HISTORICAL  Michelle [name] is 38 years old right-handed female, referred by her rheumatologist Dr. Estanislado Pandy for evaluation of muscle cramping.  She had a past medical history of psoriasis, psoriatic arthritis, was under the care of Dr. Estanislado Pandy, was treated with Humira also plaquenil 284m bid. 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, around 4 AM, while doing finger stick on a patient, she suddenly noticed right-handed muscle spasm, right thumb forceful adduction with cupping of her right hand, very painful, followed by numbness, lasting for 5 minutes, there was no right arm weakness, no dysarthria, no right leg involvement, she has no loss of consciousness, 5 minutes later, she was able to move her right hand again, but she complains of deep achy pain, numbness, lasting about 2 hours,  Few days later, she had sudden onset forceful left ankle plantarflexion, very painful, lasting for 10 minutes, followed by left leg pain, difficulty bearing weight,  A week later, she had another episodes of forceful left ankle eversion, lasting 20 minutes, again followed by left leg pain, difficulty walking with her left leg, she has to use a wheelchair,  Most recent episode was right ankle plantar flexion, lasting 5-10 minutes, followed by right leg pain,  She has chronic midline low back pain, neck pain, but no radiating pain to lower extremity, all upper extremity, she has occasionally bilateral fingertips paresthesia, she carried a diagnosis of interstitial cystitis, difficulty knee she detouring sometimes, has to do self catheter intermittently since 2014,  She is also under  pain management for her psoriatic arthritis, diffuse body aching pain, has tried gabapentin, Lyrica in the past, all cause worsening urinary retention,   She is on polypharmacy treatment already, including clonazepam 0 point 2 5 mg as needed, Flexeril 10 mg 3 times a day, Cymbalta 30 mg every day, hydrocodone as needed,  Kadian (morphine) extended release 60 mg in the morning, 30 mg at evening, meloxicam.5 a day,  Laboratory June third 20/15, showed a normal CBC, May 11 20/15, with decreased RBC 3.8 2, decreased hemoglobin 10.9, decreased hematocrit at 33  mild elevated ALT 68  UPDATE 11/04/2013: She is with her husband at today's clinical visit, her symptoms overall has improved with titrating dose of Trileptal 150 mg twice a day, she denies significant side effect from the medications,  We have reviewed extensive laboratory evaluation, only abnormality was mild elevated TSH, since then, her thyroid supplement has increased, repeat laboratory 2 weeks later showed normal TSH, otherwise low normal B12 260, rest of the laboratory evaluation was normal, including CPK, ESR, CMP, HIV, RPR,    REVIEW OF SYSTEMS: Full 14 system review of systems performed and notable only for fatigue, sweating that, ringing ears, blurry vision, urination problem, anemia, easy bruising, joint pain, swelling, cramps, achy muscles, headaches, numbness weakness dizziness   ALLERGIES: Allergies  Allergen Reactions  . Latex   . Tape     HOME MEDICATIONS: Current Outpatient Prescriptions on File Prior to Visit  Medication Sig Dispense Refill  . Adalimumab (HUMIRA PEN) 40 MG/0.8ML PNKT Frequency:every two weeks   Dosage:40   MG/0.8ML  Instructions:Humira Pen (40MG/0.8ML KIT , Subcutaneous every two weeks)  Note:      . albuterol (PROVENTIL HFA) 108 (90 BASE) MCG/ACT inhaler Take as directed.      . calcipotriene (DOVONOX) 0.005 % cream Apply topically 2 (two) times daily.      . cetirizine (ZYRTEC) 10 MG tablet Take 10 mg  by mouth daily.      . clonazePAM (KLONOPIN) 0.25 MG disintegrating tablet Take 0.25 mg by mouth 2 (two) times daily.      . cyclobenzaprine (FLEXERIL) 10 MG tablet Take 10 mg by mouth 3 (three) times daily as needed for muscle spasms.      . diclofenac (FLECTOR) 1.3 % PTCH Place 1 patch onto the skin 2 (two) times daily.      Marland Kitchen diltiazem (CARDIZEM) 120 MG tablet Take 120 mg by mouth 4 (four) times daily.      . DULoxetine (CYMBALTA) 20 MG capsule Take 20 mg by mouth daily.      . DULoxetine (CYMBALTA) 30 MG capsule Take 30 mg by mouth daily.      Marland Kitchen eletriptan (RELPAX) 40 MG tablet Take 40 mg by mouth as needed for migraine or headache. One tablet by mouth at onset of headache. May repeat in 2 hours if headache persists or recurs.      Marland Kitchen estradiol (ESTRACE) 0.1 MG/GM vaginal cream Place 1 Applicatorful vaginally 3 (three) times a week.      . estradiol (VIVELLE-DOT) 0.075 MG/24HR Place 1 patch onto the skin 2 (two) times a week.      . famotidine (PEPCID) 20 MG tablet Take 20 mg by mouth 2 (two) times daily.      . Fe Fum-FePoly-Vit C-Vit B3 (INTEGRA PO) Take by mouth 2 (two) times daily.      . fluconazole (DIFLUCAN) 200 MG tablet Take 200 mg by mouth daily.      . Glucose Blood (GLUCOMETER ELITE TEST VI) by In Vitro route.      Marland Kitchen HYDROcodone-acetaminophen (NORCO) 10-325 MG per tablet Take 1 tablet by mouth every 6 (six) hours as needed.      . hydroxychloroquine (PLAQUENIL) 200 MG tablet Take by mouth daily.      Marland Kitchen levothyroxine (SYNTHROID, LEVOTHROID) 137 MCG tablet Take 137 mcg by mouth daily before breakfast.      . lidocaine (LIDODERM) 5 % Place 1 patch onto the skin daily. Remove & Discard patch within 12 hours or as directed by MD      . meclizine (ANTIVERT) 25 MG tablet Take 25 mg by mouth 3 (three) times daily as needed for dizziness.      . meloxicam (MOBIC) 7.5 MG tablet Take 7.5 mg by mouth daily.      . mometasone (ELOCON) 0.1 % cream Apply 1 application topically daily.      .  mometasone (NASONEX) 50 MCG/ACT nasal spray Place 2 sprays into the nose daily.      . mometasone-formoterol (DULERA) 200-5 MCG/ACT AERO Inhale 2 puffs into the lungs 2 (two) times daily.      . montelukast (SINGULAIR) 10 MG tablet Take 10 mg by mouth at bedtime.      Marland Kitchen morphine (KADIAN) 30 MG 24 hr capsule Take 30 mg by mouth daily.      Marland Kitchen morphine (MSIR) 30 MG tablet Take 30 mg by mouth every 4 (four) hours as needed for severe pain.      . Multiple Vitamin (MULTI-VITAMIN PO) Take by mouth daily.      . ondansetron (ZOFRAN) 4 MG tablet  Take 4 mg by mouth every 8 (eight) hours as needed for nausea or vomiting.      . OXcarbazepine (TRILEPTAL) 150 MG tablet 1/2 po bid xone week, then one tab po bid  60 tablet  3  . pentosan polysulfate (ELMIRON) 100 MG capsule Take 100 mg by mouth 2 (two) times daily.      . pimecrolimus (ELIDEL) 1 % cream Apply topically 2 (two) times daily.      . promethazine (PHENERGAN) 12.5 MG tablet Take 12.5 mg by mouth every 6 (six) hours as needed for nausea or vomiting.      Marland Kitchen PX OMEPRAZOLE PO Take 40 mg by mouth as needed.      . ranitidine (ZANTAC) 300 MG capsule Take 300 mg by mouth every evening.      . silodosin (RAPAFLO) 8 MG CAPS capsule Take 8 mg by mouth daily with breakfast.      . tamsulosin (FLOMAX) 0.4 MG CAPS capsule Take 0.4 mg by mouth.       No current facility-administered medications on file prior to visit.    PAST MEDICAL HISTORY: Past Medical History  Diagnosis Date  . Migraine   . Diabetes   . Migraine   . Psoriasis, psoriatic arthritis  PAST SURGICAL HISTORY: C section,  Hysterectomy for abnormal PAP smear, large ovarian cyst Gastric bypass in 03/2010, lose 115 weight loss. Cholecystectomy  FAMILY HISTORY: Father 39: Colon, lung, skin cancer Mother 15 : breast, stomach cancer with liver metstasis  SOCIAL HISTORY:  History   Social History  . Marital Status: Married    Spouse Name: N/A    Number of Children: 2  . Years of  Education: N/A   Occupational History  . Disability will    Social History Main Topics  . Smoking status: Not on file  . Smokeless tobacco: Not on file  . Alcohol Use: Not on file  . Drug Use: Not on file  . Sexual Activity: Not on file   Other Topics Concern  . Not on file   Social History Narrative  . No narrative on file    PHYSICAL EXAM   Filed Vitals:   11/04/13 1454  BP: 94/65  Pulse: 78  Height: 5' 4"  (1.626 m)  Weight: 128 lb (58.06 kg)    Not recorded    Body mass index is 21.96 kg/(m^2).   Generalized: In no acute distress  Neck: Supple, no carotid bruits   Cardiac: Regular rate rhythm  Pulmonary: Clear to auscultation bilaterally  Musculoskeletal: No deformity  Neurological examination  Mentation: Alert oriented to time, place, history taking, and causual conversation  Cranial nerve II-XII: Pupils were equal round reactive to light. Extraocular movements were full.  Visual field were full on confrontational test. Bilateral fundi were sharp.  Facial sensation and strength were normal. Hearing was intact to finger rubbing bilaterally. Uvula tongue midline.  Head turning and shoulder shrug and were normal and symmetric.Tongue protrusion into cheek strength was normal.  Motor: Normal tone, bulk and strength.  Sensory: Intact to fine touch, pinprick, preserved vibratory sensation, and proprioception at toes.  Coordination: Normal finger to nose, heel-to-shin bilaterally there was no truncal ataxia  Gait: Rising up from seated position without assistance, normal stance, without trunk ataxia, moderate stride, good arm swing, smooth turning, able to perform tiptoe, and heel walking without difficulty.   Romberg signs: Negative  Deep tendon reflexes: Brachioradialis 2/2, biceps 2/2, triceps 2/2, patellar 2/2, Achilles 2/2, plantar responses were flexor  bilaterally.   DIAGNOSTIC DATA (LABS, IMAGING, TESTING) - I reviewed patient records, labs, notes,  testing and imaging myself where available.  ASSESSMENT AND PLAN  Meredeth Furber is a 38 y.o. female with complicated past medical history, including psoriasis, psoriatic arthritis, status post gastric bypass, excessive weight loss, iron deficiency, vitamin D deficiency, chronic pain, on chronic polypharmacy treatment, now presenting with intermittent painful muscle spasm, involving different muscle groups. No clear etiology found, other than a mildly low normal B12.  She is advised to continue followup with her nutritionist, multivitamin supplement, Continue Trileptal 150 mg 1 tablets twice a day, if her symptoms continue to improve, she may consider decrease Trileptal to half tablets twice a day until she came back to clinic in 6 months If she she continued to improve, we may advise her to stop Trileptal at next followup,  Marcial Pacas, M.D. Ph.D.  [facility] [address], [place], [place] [address] [phone]

## 2014-05-08 ENCOUNTER — Ambulatory Visit: Payer: BC Managed Care – PPO | Admitting: Nurse Practitioner

## 2014-05-10 ENCOUNTER — Ambulatory Visit: Payer: BC Managed Care – PPO | Admitting: Neurology

## 2014-06-02 ENCOUNTER — Ambulatory Visit: Payer: Medicare Other | Admitting: Neurology

## 2014-06-06 ENCOUNTER — Encounter: Payer: Self-pay | Admitting: Neurology

## 2014-07-07 ENCOUNTER — Ambulatory Visit (INDEPENDENT_AMBULATORY_CARE_PROVIDER_SITE_OTHER): Payer: BLUE CROSS/BLUE SHIELD | Admitting: Neurology

## 2014-07-07 ENCOUNTER — Encounter: Payer: Self-pay | Admitting: Neurology

## 2014-07-07 VITALS — BP 118/81 | HR 71 | Ht 64.0 in | Wt 126.0 lb

## 2014-07-07 DIAGNOSIS — R252 Cramp and spasm: Secondary | ICD-10-CM

## 2014-07-07 DIAGNOSIS — E119 Type 2 diabetes mellitus without complications: Secondary | ICD-10-CM | POA: Diagnosis not present

## 2014-07-07 MED ORDER — OXCARBAZEPINE 150 MG PO TABS: 150.0000 mg | ORAL_TABLET | Freq: Two times a day (BID) | ORAL | Status: DC

## 2014-07-07 NOTE — Progress Notes (Signed)
PATIENT: Michelle [name] DOB: 1976/04/10  HISTORICAL  Michelle [name] is 39 years old right-handed female, referred by her rheumatologist Dr. Estanislado Pandy for evaluation of muscle cramping.  She had a past medical history of psoriasis, psoriatic arthritis, was under the care of Dr. Estanislado Pandy, was treated with Humira also plaquenil 283m bid. 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, around 4 AM, while doing finger stick on a patient, she suddenly noticed right-handed muscle spasm, right thumb forceful adduction with cupping of her right hand, very painful, followed by numbness, lasting for 5 minutes, there was no right arm weakness, no dysarthria, no right leg involvement, she has no loss of consciousness, 5 minutes later, she was able to move her right hand again, but she complains of deep achy pain, numbness, lasting about 2 hours,  Few days later, she had sudden onset forceful left ankle plantarflexion, very painful, lasting for 10 minutes, followed by left leg pain, difficulty bearing weight,  A week later, she had another episodes of forceful left ankle eversion, lasting 20 minutes, again followed by left leg pain, difficulty walking with her left leg, she has to use a wheelchair,  Most recent episode was right ankle plantar flexion, lasting 5-10 minutes, followed by right leg pain,  She has chronic midline low back pain, neck pain, but no radiating pain to lower extremity, all upper extremity, she has occasionally bilateral fingertips paresthesia, she carried a diagnosis of interstitial cystitis, difficulty knee she detouring sometimes, has to do self catheter intermittently since 2014,  She is also under  pain management for her psoriatic arthritis, diffuse body aching pain, has tried gabapentin, Lyrica in the past, all cause worsening urinary retention,   She is on polypharmacy treatment already, including clonazepam 0.2 5 mg as needed, Flexeril 10 mg 3 times a day, Cymbalta 30 mg every day, hydrocodone as needed,  Kadian (morphine) extended release 60 mg in the morning, 30 mg at evening, meloxicam.5 a day,   Laboratory June third 20/15, showed a normal CBC, May 11 20/15, with decreased RBC 3.8 2, decreased hemoglobin 10.9, decreased hematocrit at 33  mild elevated ALT 68  UPDATE 11/04/2013: She is with her husband at today's clinical visit, her symptoms overall has improved with titrating dose of Trileptal 150 mg twice a day, she denies significant side effect from the medications,  We have reviewed extensive laboratory evaluation, only abnormality was mild elevated TSH, since then, her thyroid supplement has increased, repeat laboratory 2 weeks later showed normal TSH, otherwise low normal B12 260, rest of the laboratory evaluation was normal, including CPK, ESR, CMP, HIV, RPR,  MRI of the brain with and without contrast was normal in June 2015  UPDATE March 18th 2016: She still has intermittent spells of muscle spasm, more on left hand, draws up, she still works at [employer] Full 14 system review of systems performed and notable only for fatigue, sweating that, ringing ears, blurry vision, urination problem, anemia, easy bruising, joint pain, swelling, cramps, achy muscles, headaches, numbness weakness dizziness   ALLERGIES: Allergies  Allergen Reactions  . Latex   . Tape     HOME MEDICATIONS: Current Outpatient Prescriptions on File  Prior to Visit  Medication Sig Dispense Refill  . Adalimumab (HUMIRA PEN) 40 MG/0.8ML PNKT Frequency:every two weeks   Dosage:40   MG/0.8ML  Instructions:Humira Pen (40MG/0.8ML KIT , Subcutaneous every two weeks)  Note:    .  albuterol (PROVENTIL HFA) 108 (90 BASE) MCG/ACT inhaler Take as directed.    . calcipotriene (DOVONOX) 0.005 % cream Apply topically 2 (two) times daily.    . cefdinir (OMNICEF) 300 MG capsule     . cetirizine (ZYRTEC) 10 MG tablet Take 10 mg by mouth daily.    . clonazePAM (KLONOPIN) 0.25 MG disintegrating tablet Take 0.25 mg by mouth 2 (two) times daily.    . cyclobenzaprine (FLEXERIL) 10 MG tablet Take 10 mg by mouth 3 (three) times daily as needed for muscle spasms.    . diclofenac (FLECTOR) 1.3 % PTCH Place 1 patch onto the skin 2 (two) times daily.    Marland Kitchen diltiazem (CARDIZEM) 120 MG tablet Take 120 mg by mouth 4 (four) times daily as needed.     . eletriptan (RELPAX) 40 MG tablet Take 40 mg by mouth as needed for migraine or headache. One tablet by mouth at onset of headache. May repeat in 2 hours if headache persists or recurs.    Scarlette Shorts SURECLICK 50 MG/ML injection     . estradiol (ESTRACE) 0.1 MG/GM vaginal cream Place 1 Applicatorful vaginally 3 (three) times a week.    . estradiol (VIVELLE-DOT) 0.075 MG/24HR Place 1 patch onto the skin 2 (two) times a week.    . famotidine (PEPCID) 20 MG tablet Take 20 mg by mouth 2 (two) times daily.    . Fe Fum-FePoly-Vit C-Vit B3 (INTEGRA PO) Take by mouth 2 (two) times daily.    . fluconazole (DIFLUCAN) 200 MG tablet Take 200 mg by mouth daily.    . Glucose Blood (GLUCOMETER ELITE TEST VI) by In Vitro route.    . hydroxychloroquine (PLAQUENIL) 200 MG tablet Take by mouth daily.    Marland Kitchen levothyroxine (SYNTHROID, LEVOTHROID) 137 MCG tablet Take 137 mcg by mouth daily before breakfast.    . lidocaine (LIDODERM) 5 % Place 1 patch onto the skin daily. Remove & Discard patch within 12 hours or as directed by MD    . meclizine (ANTIVERT) 25 MG tablet Take 25 mg by mouth 3 (three) times daily as needed for dizziness.    . Meth-Hyo-M Bl-Na Phos-Ph Sal (URIBEL) 118 MG CAPS     . mometasone (ELOCON) 0.1 % cream Apply 1 application topically daily.    .  mometasone (NASONEX) 50 MCG/ACT nasal spray Place 2 sprays into the nose daily.    . mometasone-formoterol (DULERA) 200-5 MCG/ACT AERO Inhale 2 puffs into the lungs 2 (two) times daily.    . montelukast (SINGULAIR) 10 MG tablet Take 10 mg by mouth at bedtime.    Marland Kitchen morphine (KADIAN) 30 MG 24 hr capsule Take 30 mg by mouth. Take 7m in am and 38min pm.    . Multiple Vitamin (MULTI-VITAMIN PO) Take by mouth daily.    . ondansetron (ZOFRAN) 4 MG tablet Take 4 mg by mouth every 8 (eight) hours as needed for nausea or vomiting.    . OXcarbazepine (TRILEPTAL) 150 MG tablet 1/2 po bid xone week, then one tab po bid 60 tablet 6  . pentosan polysulfate (ELMIRON) 100 MG capsule Take 100 mg by mouth 2 (two) times daily.    . pimecrolimus (ELIDEL) 1 % cream Apply topically 2 (two) times daily.    .Marland Kitchen  promethazine (PHENERGAN) 12.5 MG tablet Take 12.5 mg by mouth every 6 (six) hours as needed for nausea or vomiting.    Marland Kitchen PX OMEPRAZOLE PO Take 40 mg by mouth as needed.    . ranitidine (ZANTAC) 300 MG capsule Take 300 mg by mouth every evening.    . tamsulosin (FLOMAX) 0.4 MG CAPS capsule Take 0.4 mg by mouth.    . Vitamin D, Ergocalciferol, (DRISDOL) 50000 UNITS CAPS capsule      No current facility-administered medications on file prior to visit.    PAST MEDICAL HISTORY: Past Medical History  Diagnosis Date  . Migraine   . Diabetes   . Migraine   . Psoriasis, psoriatic arthritis  PAST SURGICAL HISTORY: C section,  Hysterectomy for abnormal PAP smear, large ovarian cyst Gastric bypass in 03/2010, lose 115 weight loss. Cholecystectomy  FAMILY HISTORY: Father 20: Colon, lung, skin cancer Mother 32 : breast, stomach cancer with liver metstasis  SOCIAL HISTORY:  History   Social History  . Marital Status: Married    Spouse Name: N/A    Number of Children: 2  . Years of Education: N/A   Occupational History  . Disability will    Social History Main Topics  . Smoking status: Not on file    . Smokeless tobacco: Not on file  . Alcohol Use: Not on file  . Drug Use: Not on file  . Sexual Activity: Not on file   Other Topics Concern  . Not on file   Social History Narrative  . No narrative on file    PHYSICAL EXAM   Filed Vitals:   07/07/14 1048  BP: 118/81  Pulse: 71  Height: 5' 4"  (1.626 m)  Weight: 126 lb (57.153 kg)    Not recorded      Body mass index is 21.62 kg/(m^2). PHYSICAL EXAMNIATION:  Gen: NAD, conversant, well nourised, obese, well groomed                     Cardiovascular: Regular rate rhythm, no peripheral edema, warm, nontender. Eyes: Conjunctivae clear without exudates or hemorrhage Neck: Supple, no carotid bruise. Pulmonary: Clear to auscultation bilaterally   NEUROLOGICAL EXAM:  MENTAL STATUS: Speech:    Speech is normal; fluent and spontaneous with normal comprehension.  Cognition:    The patient is oriented to person, place, and time;     recent and remote memory intact;     language fluent;     normal attention, concentration,     fund of knowledge.  CRANIAL NERVES: CN II: Visual fields are full to confrontation. Fundoscopic exam is normal with sharp discs and no vascular changes. Venous pulsations are present bilaterally. Pupils are 4 mm and briskly reactive to light. Visual acuity is 20/20 bilaterally. CN III, IV, VI: extraocular movement are normal. No ptosis. CN V: Facial sensation is intact to pinprick in all 3 divisions bilaterally. Corneal responses are intact.  CN VII: Face is symmetric with normal eye closure and smile. CN VIII: Hearing is normal to rubbing fingers CN IX, X: Palate elevates symmetrically. Phonation is normal. CN XI: Head turning and shoulder shrug are intact CN XII: Tongue is midline with normal movements and no atrophy.  MOTOR: There is no pronator drift of out-stretched arms. Muscle bulk and tone are normal. Muscle strength is normal.   Shoulder abduction Shoulder external rotation Elbow flexion  Elbow extension Wrist flexion Wrist extension Finger abduction Hip flexion Knee flexion Knee extension Ankle dorsi flexion Ankle  plantar flexion  R 5 5 5 5 5 5 5 5 5 5 5 5   L 5 5 5 5 5 5 5 5 5 5 5 5     REFLEXES: Reflexes are 2+ and symmetric at the biceps, triceps, knees, and ankles. Plantar responses are flexor.  SENSORY: Light touch, pinprick, position sense, and vibration sense are intact in fingers and toes.  COORDINATION: Rapid alternating movements and fine finger movements are intact. There is no dysmetria on finger-to-nose and heel-knee-shin. There are no abnormal or extraneous movements.   GAIT/STANCE: Posture is normal. Gait is steady with normal steps, base, arm swing, and turning. Heel and toe walking are normal. Tandem gait is normal.  Romberg is absent.   DIAGNOSTIC DATA (LABS, IMAGING, TESTING) - I reviewed patient records, labs, notes, testing and imaging myself where available.  ASSESSMENT AND PLAN  Michelle [name] is a 39 y.o. female with complicated past medical history, including psoriasis, psoriatic arthritis, status post gastric bypass, excessive weight loss, iron deficiency, vitamin D deficiency, chronic pain, on chronic polypharmacy treatment, now presenting with intermittent painful muscle spasm, involving different muscle groups. It is usually triggered by emotional stress, No clear etiology found, other than a mildly low normal B12. Symptomology does not support a diagnosis of seizure, normal MRI of brain with and without contrast,  She responded very well to Trileptal 150 mg twice a day, will continue treatment, Continue current Trileptal 150 mg twice a day, return to clinic in one year with Hoyle Sauer, document all event,    Marcial Pacas, M.D. Ph.D.  [facility] [address] [place], [place] [address] [phone]

## 2015-07-09 ENCOUNTER — Ambulatory Visit (INDEPENDENT_AMBULATORY_CARE_PROVIDER_SITE_OTHER): Payer: BLUE CROSS/BLUE SHIELD | Admitting: Nurse Practitioner

## 2015-07-09 ENCOUNTER — Encounter: Payer: Self-pay | Admitting: Nurse Practitioner

## 2015-07-09 VITALS — BP 102/70 | HR 79 | Ht 64.0 in | Wt 130.8 lb

## 2015-07-09 DIAGNOSIS — G43909 Migraine, unspecified, not intractable, without status migrainosus: Secondary | ICD-10-CM

## 2015-07-09 DIAGNOSIS — M797 Fibromyalgia: Secondary | ICD-10-CM

## 2015-07-09 DIAGNOSIS — R252 Cramp and spasm: Secondary | ICD-10-CM | POA: Diagnosis not present

## 2015-07-09 MED ORDER — OXCARBAZEPINE 150 MG PO TABS: 150.0000 mg | ORAL_TABLET | Freq: Two times a day (BID) | ORAL | Status: DC

## 2015-07-09 NOTE — Progress Notes (Signed)
[facility]  PATIENT: Michelle Michelle [name] DOB: [birth year]   REASON FOR VISIT: follow up for fibromyalgia, migraine,  HISTORY FROM:patient    HISTORY OF PRESENT ILLNESS: HISTORY Michelle Michelle [name] is 40 years old right-handed Michelle [name], referred by her rheumatologist Dr. Estanislado Pandy for evaluation of muscle cramping.  She had a past medical history of psoriasis, psoriatic arthritis, was under the care of Dr. Estanislado Pandy, was treated with Humira also plaquenil 221m bid. 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, around 4 AM, while doing finger stick on a patient, she suddenly noticed right-handed muscle spasm, right thumb forceful adduction with cupping of her right hand, very painful, followed by numbness, lasting for 5 minutes, there was no right arm weakness, no dysarthria, no right leg involvement, she has no loss of consciousness, 5 minutes later, she was able to move her right hand again, but she complains of deep achy pain, numbness, lasting about 2 hours, Few days later, she had sudden onset forceful left ankle plantarflexion, very painful, lasting for 10 minutes, followed by left leg pain, difficulty bearing weight, A week later, she had another episodes of forceful left ankle eversion, lasting 20 minutes, again followed by left leg pain, difficulty walking with her left leg, she has to use a wheelchair, Most recent episode was right ankle plantar flexion, lasting 5-10 minutes, followed by right leg pain,  She has chronic midline low back pain, neck pain, but no radiating pain to lower extremity, all upper extremity, she has occasionally bilateral fingertips paresthesia, she carried a diagnosis  of interstitial cystitis, difficulty knee she detouring sometimes, has to do self catheter intermittently since 2014, She is also under pain management for her psoriatic arthritis, diffuse body aching pain, has tried gabapentin, Lyrica in the past, all cause worsening urinary retention,  She is on polypharmacy treatment already, including clonazepam 0.2 5 mg as needed, Flexeril 10 mg 3 times a day, Cymbalta 30 mg every day, hydrocodone as needed, Kadian (morphine) extended release 60 mg in the morning, 30 mg at evening, meloxicam.5 a day,  Laboratory June third 20/15, showed a normal CBC, May 11 20/15, with decreased RBC 3.8 2, decreased hemoglobin 10.9, decreased hematocrit at 33 mild elevated ALT 68  UPDATE 11/04/2013:She is with her husband at today's clinical visit, her symptoms overall has improved with titrating dose of Trileptal 150 mg twice a day, she denies significant side effect from the medications, We have reviewed extensive laboratory evaluation, only abnormality was mild elevated TSH, since then, her thyroid supplement has increased, repeat laboratory 2 weeks later showed normal TSH, otherwise low normal B12 260, rest of the laboratory evaluation was normal, including CPK, ESR, CMP, HIV, RPR, MRI of the brain and without contrast was normal in June 2015 UPDATE March 18th 2016:She still has intermittent spells of muscle spasm, more on left hand, draws up, she still works at [employer]03/20/2017. Michelle Michelle [name] returns for yearly follow-up. She continues to have intermittent muscle spasms, [place] She had more muscle spasms during that time and is only taking her Flexeril when necessary. She is also on Trileptal without side effects. She has  not had any seizure type activity. She continues to work part-time as a Charity fundraiser. She is having 1-2 migraines per month relieved with Zomig. She recently had a facet  injection which was significant for relieving her headaches. She returns for reevaluation. Her chronic pain is managed by a pain center  REVIEW OF SYSTEMS: Full 14 system review of systems performed and notable only for those listed, all others are neg:  Constitutional: Fatigue Cardiovascular: Palpitations Ear/Nose/Throat: neg  Skin: neg Eyes: Blurred vision Respiratory: neg Gastroitestinal: neg  Hematology/Lymphatic: neg  Endocrine: neg Musculoskeletal: Joint pain back pain neck pain treated by pain management Allergy/Immunology: neg Neurological: Headache, weakness Psychiatric: Anxiety Sleep : neg   ALLERGIES: Allergies  Allergen Reactions  . Latex Rash    redness  . Tape Rash    HOME MEDICATIONS: Outpatient Prescriptions Prior to Visit  Medication Sig Dispense Refill  . albuterol (PROVENTIL HFA) 108 (90 BASE) MCG/ACT inhaler Take as directed.    Marland Kitchen albuterol (PROVENTIL) (5 MG/ML) 0.5% nebulizer solution Take 2.5 mg by nebulization every 6 (six) hours as needed for wheezing or shortness of breath.    . calcipotriene (DOVONOX) 0.005 % cream Apply topically 2 (two) times daily.    . cefdinir (OMNICEF) 300 MG capsule     . cetirizine (ZYRTEC) 10 MG tablet Take 10 mg by mouth daily.    . cyclobenzaprine (FLEXERIL) 10 MG tablet Take 10 mg by mouth 3 (three) times daily as needed for muscle spasms.    . diclofenac (FLECTOR) 1.3 % PTCH Place 1 patch onto the skin 2 (two) times daily.    Marland Kitchen diltiazem (CARDIZEM) 120 MG tablet Take 120 mg by mouth 4 (four) times daily as needed.     . DULoxetine (CYMBALTA) 20 MG capsule Take 20 mg by mouth. Takes 53m every other day.    . eletriptan (RELPAX) 40 MG tablet Take 40 mg by mouth as needed for migraine or headache. One tablet by mouth at onset of headache. May repeat in 2 hours if headache persists or recurs.    .Scarlette ShortsSURECLICK 50 MG/ML injection     . estradiol (ESTRACE) 0.1 MG/GM vaginal cream Place 1 Applicatorful vaginally 3 (three)  times a week.    . estradiol (VIVELLE-DOT) 0.075 MG/24HR Place 1 patch onto the skin 2 (two) times a week.    . famotidine (PEPCID) 20 MG tablet Take 20 mg by mouth 2 (two) times daily.    . Fe Fum-FePoly-Vit C-Vit B3 (INTEGRA PO) Take by mouth 2 (two) times daily.    . fluconazole (DIFLUCAN) 200 MG tablet Take 200 mg by mouth daily.    . Glucose Blood (GLUCOMETER ELITE TEST VI) by In Vitro route.    . hydroxychloroquine (PLAQUENIL) 200 MG tablet Take by mouth daily.    .Marland Kitchenlevothyroxine (SYNTHROID, LEVOTHROID) 137 MCG tablet Take 137 mcg by mouth daily before breakfast.    . lidocaine (LIDODERM) 5 % Place 1 patch onto the skin daily. Remove & Discard patch within 12 hours or as directed by MD    . meclizine (ANTIVERT) 25 MG tablet Take 25 mg by mouth 3 (three) times daily as needed for dizziness.    . Meth-Hyo-M Bl-Na Phos-Ph Sal (URIBEL) 118 MG CAPS     . mometasone (ELOCON) 0.1 % cream Apply 1 application topically daily.    . mometasone (NASONEX) 50 MCG/ACT nasal spray Place 2 sprays into the nose daily.    . mometasone-formoterol (DULERA) 200-5 MCG/ACT AERO Inhale 2 puffs  into the lungs 2 (two) times daily.    . montelukast (SINGULAIR) 10 MG tablet Take 10 mg by mouth at bedtime.    Marland Kitchen morphine (KADIAN) 30 MG 24 hr capsule Take 30 mg by mouth. Take 63m in am and 349min pm.    . Multiple Vitamin (MULTI-VITAMIN PO) Take by mouth daily.    . ondansetron (ZOFRAN) 4 MG tablet Take 4 mg by mouth every 8 (eight) hours as needed for nausea or vomiting.    . OXcarbazepine (TRILEPTAL) 150 MG tablet Take 1 tablet (150 mg total) by mouth 2 (two) times daily. 60 tablet 11  . oxyCODONE-acetaminophen (PERCOCET) 10-325 MG per tablet as needed.    . pentosan polysulfate (ELMIRON) 100 MG capsule Take 100 mg by mouth 2 (two) times daily.    . pimecrolimus (ELIDEL) 1 % cream Apply topically 2 (two) times daily.    . promethazine (PHENERGAN) 12.5 MG tablet Take 12.5 mg by mouth every 6 (six) hours as needed for  nausea or vomiting.    . ranitidine (ZANTAC) 300 MG capsule Take 300 mg by mouth every evening.    . tamsulosin (FLOMAX) 0.4 MG CAPS capsule Take 0.4 mg by mouth.    . Vitamin D, Ergocalciferol, (DRISDOL) 50000 UNITS CAPS capsule     . zolmitriptan (ZOMIG) 5 MG nasal solution Place into the nose as needed for migraine.    . PX OMEPRAZOLE PO Take 40 mg by mouth as needed. Reported on 07/09/2015    . Adalimumab (HUMIRA PEN) 40 MG/0.8ML PNKT Frequency:every two weeks   Dosage:40   MG/0.8ML  Instructions:Humira Pen (40MG/0.8ML KIT , Subcutaneous every two weeks)  Note:    . clonazePAM (KLONOPIN) 0.25 MG disintegrating tablet Take 0.25 mg by mouth 2 (two) times daily.     No facility-administered medications prior to visit.    PAST MEDICAL HISTORY: Past Medical History  Diagnosis Date  . Migraine   . Diabetes ([place]  . Depression   . Fibromyalgia   . Muscle cramps     PAST SURGICAL HISTORY: Past Surgical History  Procedure Laterality Date  . Cesarean section    . Abdominal hysterectomy    . Gastric bypass    . Cholecystectomy    . Facet joint injection    . Si joint injection      FAMILY HISTORY: No family history on file.  SOCIAL HISTORY: Social History   Social History  . Marital Status: Married    Spouse Name: N/A  . Number of Children: 2  . Years of Education: college   Occupational History  .      [employer]    Social History Main Topics  . Smoking status: Never Smoker   . Smokeless tobacco: Never Used  . Alcohol Use: No  . Drug Use: No  . Sexual Activity: Not on file   Other Topics Concern  . Not on file   Social History Narrative   Patient lives at home alone with her husband and she works full time disability .    Right handed.   Caffeine one times per day.     PHYSICAL EXAM  Filed Vitals:   07/09/15 0755  BP: 102/70  Pulse: 79  Height: 5' 4"  (1.626 m)  Weight: 130 lb 12.8 oz (59.33 kg)   Body mass index is 22.44 kg/(m^2).  Generalized:  Well developed, in no acute distress  Head: normocephalic and atraumatic,. Oropharynx benign  Neck: Supple, no carotid bruits  Cardiac:  Regular rate rhythm, no murmur  Musculoskeletal: No deformity   Neurological examination   Mentation: Alert oriented to time, place, history taking. Attention span and concentration appropriate. Recent and remote memory intact.  Follows all commands speech and language fluent.   Cranial nerve II-XII: .Pupils were equal round reactive to light extraocular movements were full, visual field were full on confrontational test. Facial sensation and strength were normal. hearing was intact to finger rubbing bilaterally. Uvula tongue midline. head turning and shoulder shrug were normal and symmetric.Tongue protrusion into cheek strength was normal. Motor: normal bulk and tone, full strength in the BUE, BLE, fine finger movements normal, no pronator drift. No focal weakness Sensory: normal and symmetric to light touch,   Coordination: finger-nose-finger, heel-to-shin bilaterally, no dysmetria Reflexes: Brachioradialis 2/2, biceps 2/2, triceps 2/2, patellar 2/2, Achilles 2/2, plantar responses were flexor bilaterally. Gait and Station: Rising up from seated position without assistance, normal stance,  moderate stride, good arm swing, smooth turning, able to perform tiptoe, and heel walking without difficulty. Tandem gait is mildly unsteady  DIAGNOSTIC DATA (LABS, IMAGING, TESTING) -  ASSESSMENT AND PLAN [place] Creswell is a 40y.o. Michelle [name] with complicated past medical history, including psoriasis, psoriatic arthritis, status post gastric bypass, excessive weight loss, iron deficiency, vitamin D deficiency, chronic pain, on chronic polypharmacy treatment, now presenting with intermittent painful muscle spasm, involving different muscle groups. It is usually triggered by emotional stress, No clear etiology found, other than a mildly low normal B12. Symptomology does not  support a diagnosis of seizure, normal MRI of brain with and without contrast.  PLAN: Continue Trileptal at current dose RX to patient per request Continue Zomig acutely for migraines Can take tizanidine 3 times a day if necessary for cramping and muscle spasm if necessary F/U yearly Dennie Bible, [facility], [facility], [facility] [address], [place], [place] [address] [phone]

## 2015-07-09 NOTE — Progress Notes (Signed)
I have reviewed and agreed above plan. 

## 2015-07-09 NOTE — Patient Instructions (Signed)
Continue Trileptal at current dose RX to patient per request Continue Zomig acutely for migraines F/U yearly

## 2015-07-30 ENCOUNTER — Encounter: Payer: Self-pay | Admitting: Physical Medicine & Rehabilitation

## 2015-12-14 ENCOUNTER — Telehealth: Payer: Self-pay | Admitting: Neurology

## 2015-12-14 NOTE — Telephone Encounter (Signed)
Spoke to pt and appt made for 12-17-15 at 1000.

## 2015-12-14 NOTE — Telephone Encounter (Signed)
Pt called said she is having difficulty walking today and it is hard to lift her leg. She sts the gabapentin is not working and pain management advised her to call Dr Terrace ArabiaYan if it does not help. Please call today

## 2015-12-14 NOTE — Telephone Encounter (Signed)
Chart reviewed, she had extensive evaluation over the past couple years, including normal MRI of the brain, cervical spine, laboratory evaluations, no etiology found, she is scheduled to see Eber JonesCarolyn in March 2018.  Please move up her appointment to Carolyn's next available.

## 2015-12-17 ENCOUNTER — Ambulatory Visit (INDEPENDENT_AMBULATORY_CARE_PROVIDER_SITE_OTHER): Payer: BLUE CROSS/BLUE SHIELD | Admitting: Nurse Practitioner

## 2015-12-17 ENCOUNTER — Encounter: Payer: Self-pay | Admitting: Nurse Practitioner

## 2015-12-17 VITALS — BP 103/64 | HR 94 | Ht 64.0 in | Wt 126.0 lb

## 2015-12-17 DIAGNOSIS — G43909 Migraine, unspecified, not intractable, without status migrainosus: Secondary | ICD-10-CM

## 2015-12-17 DIAGNOSIS — R269 Unspecified abnormalities of gait and mobility: Secondary | ICD-10-CM | POA: Diagnosis not present

## 2015-12-17 DIAGNOSIS — R200 Anesthesia of skin: Secondary | ICD-10-CM

## 2015-12-17 DIAGNOSIS — M797 Fibromyalgia: Secondary | ICD-10-CM

## 2015-12-17 DIAGNOSIS — R252 Cramp and spasm: Secondary | ICD-10-CM | POA: Diagnosis not present

## 2015-12-17 DIAGNOSIS — R202 Paresthesia of skin: Secondary | ICD-10-CM | POA: Diagnosis not present

## 2015-12-17 DIAGNOSIS — Z5181 Encounter for therapeutic drug level monitoring: Secondary | ICD-10-CM | POA: Diagnosis not present

## 2015-12-17 NOTE — Progress Notes (Signed)
[facility]  PATIENT: Nabilah Davoli DOB: [birth year]   REASON FOR VISIT: follow up for fibromyalgia, migraine, loss of balance HISTORY FROM:patient    HISTORY OF PRESENT ILLNESS: HISTORY Nikitia Asbill is 40 years old right-handed female, referred by her rheumatologist Dr. Estanislado Pandy for evaluation of muscle cramping.  She had a past medical history of psoriasis, psoriatic arthritis, was under the care of Dr. Estanislado Pandy, was treated with Humira also plaquenil 234m bid. 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, around 4 AM, while doing finger stick on a patient, she suddenly noticed right-handed muscle spasm, right thumb forceful adduction with cupping of her right hand, very painful, followed by numbness, lasting for 5 minutes, there was no right arm weakness, no dysarthria, no right leg involvement, she has no loss of consciousness, 5 minutes later, she was able to move her right hand again, but she complains of deep achy pain, numbness, lasting about 2 hours, Few days later, she had sudden onset forceful left ankle plantarflexion, very painful, lasting for 10 minutes, followed by left leg pain, difficulty bearing weight, A week later, she had another episodes of forceful left ankle eversion, lasting 20 minutes, again followed by left leg pain, difficulty walking with her left leg, she has to use a wheelchair, Most recent episode was right ankle plantar flexion, lasting 5-10 minutes, followed by right leg pain, She has chronic midline low back pain, neck pain, but no radiating pain to lower extremity, all upper extremity, she has occasionally bilateral fingertips paresthesia, she carried  a diagnosis of interstitial cystitis, difficulty knee she detouring sometimes, has to do self catheter intermittently since 2014, She is also under pain management for her psoriatic arthritis, diffuse body aching pain, has tried gabapentin, Lyrica in the past, all cause worsening urinary retention,  She is on polypharmacy treatment already, including clonazepam 0.2 5 mg as needed, Flexeril 10 mg 3 times a day, Cymbalta 30 mg every day, hydrocodone as needed, Kadian (morphine) extended release 60 mg in the morning, 30 mg at evening, meloxicam.5 a day,  Laboratory June third 20/15, showed a normal CBC, May 11 20/15, with decreased RBC 3.8 2, decreased hemoglobin 10.9, decreased hematocrit at 33 mild elevated ALT 68  UPDATE 11/04/2013:She is with her husband at today's clinical visit, her symptoms overall has improved with titrating dose of Trileptal 150 mg twice a day, she denies significant side effect from the medications, We have reviewed extensive laboratory evaluation, only abnormality was mild elevated TSH, since then, her thyroid supplement has increased, repeat laboratory 2 weeks later showed normal TSH, otherwise low normal B12 260, rest of the laboratory evaluation was normal, including CPK, ESR, CMP, HIV, RPR, MRI of the brain and without contrast was normal in June 2015 UPDATE March 18th 2016:YYShe still has intermittent spells of muscle spasm, more on left hand, draws up, she still works at [employer]/20/2017YY. Ms PBarner 40year old female returns for yearly follow-up. She continues to have intermittent muscle spasms, [place] She had more muscle spasms during that time and is only taking her Flexeril when necessary. She is also on Trileptal without side effects. She  has not had any seizure type activity. She continues to work part-time as a Charity fundraiser. She is having 1-2 migraines per month relieved with Zomig. She recently  had a facet injection which was significant for relieving her headaches. She returns for reevaluation. Her chronic pain is managed by a pain center UPDATE 08/28/2017CM Ms.Lares, 40 year old female returns for follow-up. She has history of intermittent muscle spasms and migraine headaches. She also has a history of back pain and neck pain. She sees pain management. She has normal CPK, ESR, CMP, HIV, RPR, MRI of the brain and without contrast was normal in June 2015. Trileptal 150 twice a day has helped her symptoms /cramping sensations. She comes in today due to a fall last week she fell in her home and  landed on her right hip however she fell against blankets and a  Box. No significant bruising since that time she's had some numbness and tingling sensation in the right lower extremity. In addition she is wearing a 30 day monitor for syncopal episodes, she has had a monitor on for approximately 2 weeks. She returns for reevaluation  REVIEW OF SYSTEMS: Full 14 system review of systems performed and notable only for those listed, all others are neg:  Constitutional: Fatigue Cardiovascular: Palpitations Ear/Nose/Throat: neg  Skin: neg Eyes: Blurred vision Respiratory: neg Gastroitestinal: Nausea and vomiting  Hematology/Lymphatic: Easy bruising Endocrine: neg Musculoskeletal: Joint pain back pain neck pain treated by pain management walking difficulty Allergy/Immunology: neg Neurological: Headache, weakness Psychiatric: Anxiety, decreased concentration Sleep : neg   ALLERGIES: Allergies  Allergen Reactions  . Latex Rash    redness  . Tape Rash    HOME MEDICATIONS: Outpatient Medications Prior to Visit  Medication Sig Dispense Refill  . albuterol (PROVENTIL HFA) 108 (90 BASE) MCG/ACT inhaler Take as directed.    Marland Kitchen albuterol (PROVENTIL) (5 MG/ML) 0.5% nebulizer solution Take 2.5 mg by nebulization every 6 (six) hours as needed for wheezing or shortness of breath.    . calcipotriene  (DOVONOX) 0.005 % cream Apply topically 2 (two) times daily.    . cetirizine (ZYRTEC) 10 MG tablet Take 10 mg by mouth daily.    . cyclobenzaprine (FLEXERIL) 10 MG tablet Take 10 mg by mouth 3 (three) times daily as needed for muscle spasms.    . diclofenac (FLECTOR) 1.3 % PTCH Place 1 patch onto the skin 2 (two) times daily.    Marland Kitchen diltiazem (CARDIZEM) 120 MG tablet Take 120 mg by mouth 4 (four) times daily as needed.     . DULoxetine (CYMBALTA) 20 MG capsule Take 20 mg by mouth. Takes 4m every other day.    . eletriptan (RELPAX) 40 MG tablet Take 40 mg by mouth as needed for migraine or headache. One tablet by mouth at onset of headache. May repeat in 2 hours if headache persists or recurs.    .Scarlette ShortsSURECLICK 50 MG/ML injection     . estradiol (ESTRACE) 0.1 MG/GM vaginal cream Place 1 Applicatorful vaginally 3 (three) times a week.    . estradiol (VIVELLE-DOT) 0.075 MG/24HR Place 1 patch onto the skin 2 (two) times a week.    . famotidine (PEPCID) 20 MG tablet Take 20 mg by mouth 2 (two) times daily.    . Fe Fum-FePoly-Vit C-Vit B3 (INTEGRA PO) Take by mouth 2 (two) times daily.    . fluconazole (DIFLUCAN) 200 MG tablet Take 200 mg by mouth daily.    . Glucose Blood (GLUCOMETER ELITE TEST VI) by In Vitro route.    .Marland Kitchen  hydroxychloroquine (PLAQUENIL) 200 MG tablet Take by mouth daily.    Marland Kitchen levothyroxine (SYNTHROID, LEVOTHROID) 137 MCG tablet Take 137 mcg by mouth daily before breakfast.    . lidocaine (LIDODERM) 5 % Place 1 patch onto the skin daily. Remove & Discard patch within 12 hours or as directed by MD    . meclizine (ANTIVERT) 25 MG tablet Take 25 mg by mouth 3 (three) times daily as needed for dizziness.    . Meth-Hyo-M Bl-Na Phos-Ph Sal (URIBEL) 118 MG CAPS     . mometasone (ELOCON) 0.1 % cream Apply 1 application topically daily.    . mometasone (NASONEX) 50 MCG/ACT nasal spray Place 2 sprays into the nose daily.    . mometasone-formoterol (DULERA) 200-5 MCG/ACT AERO Inhale 2 puffs  into the lungs 2 (two) times daily.    . montelukast (SINGULAIR) 10 MG tablet Take 10 mg by mouth at bedtime.    Marland Kitchen morphine (KADIAN) 30 MG 24 hr capsule Take 30 mg by mouth. Take 56m in am and 353min pm.    . Multiple Vitamin (MULTI-VITAMIN PO) Take by mouth daily.    . ondansetron (ZOFRAN) 4 MG tablet Take 4 mg by mouth every 8 (eight) hours as needed for nausea or vomiting.    . OXcarbazepine (TRILEPTAL) 150 MG tablet Take 1 tablet (150 mg total) by mouth 2 (two) times daily. 60 tablet 11  . oxyCODONE-acetaminophen (PERCOCET) 10-325 MG per tablet as needed.    . pentosan polysulfate (ELMIRON) 100 MG capsule Take 100 mg by mouth 2 (two) times daily.    . pimecrolimus (ELIDEL) 1 % cream Apply topically 2 (two) times daily.    . PX OMEPRAZOLE PO Take 40 mg by mouth as needed. Reported on 07/09/2015    . ranitidine (ZANTAC) 300 MG capsule Take 300 mg by mouth every evening.    . tamsulosin (FLOMAX) 0.4 MG CAPS capsule Take 0.4 mg by mouth.    . Vitamin D, Ergocalciferol, (DRISDOL) 50000 UNITS CAPS capsule     . zolmitriptan (ZOMIG) 5 MG nasal solution Place into the nose as needed for migraine.    . cefdinir (OMNICEF) 300 MG capsule      No facility-administered medications prior to visit.     PAST MEDICAL HISTORY: Past Medical History:  Diagnosis Date  . Depression   . Diabetes ([place]  . Fibromyalgia   . Migraine   . Muscle cramps     PAST SURGICAL HISTORY: Past Surgical History:  Procedure Laterality Date  . ABDOMINAL HYSTERECTOMY    . CESAREAN SECTION    . CHOLECYSTECTOMY    . FACET JOINT INJECTION    . GASTRIC BYPASS    . SI Joint Injection      FAMILY HISTORY: History reviewed. No pertinent family history.  SOCIAL HISTORY: Social History   Social History  . Marital status: Married    Spouse name: N/A  . Number of children: 2  . Years of education: college   Occupational History  .      [employer]    Social History Main Topics  . Smoking status: Never  Smoker  . Smokeless tobacco: Never Used  . Alcohol use No  . Drug use: No  . Sexual activity: Not on file   Other Topics Concern  . Not on file   Social History Narrative   Patient lives at home alone with her husband and she works full time disability .    Right handed.  Caffeine one times per day.     PHYSICAL EXAM  Vitals:   12/17/15 1011  BP: 103/64  Pulse: 94  Weight: 126 lb (57.2 kg)  Height: 5' 4"  (1.626 m)   Body mass index is 21.63 kg/m.  Generalized: Well developed, in no acute distress  Head: normocephalic and atraumatic,. Oropharynx benign  Neck: Supple, no carotid bruits  Cardiac: Regular rate rhythm, no murmur  Musculoskeletal: No deformity   Neurological examination   Mentation: Alert oriented to time, place, history taking. Attention span and concentration appropriate. Recent and remote memory intact.  Follows all commands speech and language fluent.   Cranial nerve II-XII: .Pupils were equal round reactive to light extraocular movements were full, visual field were full on confrontational test. Facial sensation and strength were normal. hearing was intact to finger rubbing bilaterally. Uvula tongue midline. head turning and shoulder shrug were normal and symmetric.Tongue protrusion into cheek strength was normal. Motor: normal bulk and tone, full strength in the BUE, BLE, except 4 out of 5 weakness right lower extremity distally .Fine finger movements normal, no pronator drift.  Sensory: normal and symmetric to light touch, pinprick vibratory and proprioception on the left, decreased to vibratory and pinprick on the right lower extremity   Coordination: finger-nose-finger, heel-to-shin bilaterally, no dysmetria Reflexes: Brachioradialis 2/2, biceps 2/2, triceps 2/2, patellar 2/2, Achilles 1/1, plantar responses were flexor bilaterally. Gait and Station: Rising up from seated position without assistance, normal stance,  moderate stride, good arm swing,  smooth turning, able to perform tiptoe, and heel walking without difficulty. Tandem gait is mildly unsteady. Ambulates with single-point cane  DIAGNOSTIC DATA (LABS, IMAGING, TESTING) -  ASSESSMENT AND PLAN Ravonda Brecheen is a 40y.o. female with complicated past medical history, including psoriasis, psoriatic arthritis, status post gastric bypass, excessive weight loss, iron deficiency, vitamin D deficiency, chronic pain, on chronic polypharmacy treatment, now presenting with intermittent painful muscle spasm, involving different muscle groups. It is usually triggered by emotional stress, No clear etiology found, other than a mildly low normal B12. Symptomology does not support a diagnosis of seizure, normal MRI of brain with and without contrast. Fall last week , in her home and  landed on her right hip however she fell against blankets and a  Box. No significant bruising since that time she's had some numbness and tingling sensation in the right lower extremity and mild weakness. She is wearing a 30 day cardiac monitor for syncopal episodes. The patient is a current patient of Dr. Krista Blue who is out of the office today . This note is sent to the work in doctor.     PLAN: [place]/EMG to be scheduled  Trileptal level today  continue same dose for now Use cane at all times for safe ambulation F/U in 6 months Dennie Bible, [facility], [facility], [facility] [address], [place], [place] [address] [phone]

## 2015-12-17 NOTE — Patient Instructions (Signed)
[place]/EM[facility] to be scheduled Trileptal level today  Use cane at all times for safe ambulation F/U in 6 months

## 2015-12-17 NOTE — Progress Notes (Signed)
I have read the note, and I agree with the clinical assessment and plan.  WILLIS,CHARLES KEITH   

## 2015-12-19 ENCOUNTER — Telehealth: Payer: Self-pay | Admitting: *Deleted

## 2015-12-19 DIAGNOSIS — R252 Cramp and spasm: Secondary | ICD-10-CM

## 2015-12-19 LAB — 10-HYDROXYCARBAZEPINE: OXCARBAZEPINE SERPL-MCNC: NOT DETECTED ug/mL (ref 10–35)

## 2015-12-19 NOTE — Telephone Encounter (Signed)
-----   Message from Nilda RiggsNancy Carolyn Martin, NP sent at 12/19/2015  2:51 PM EDT ----- No trileptal detected in blood. Please take as directed and have level repeated in 2 weeks.

## 2015-12-19 NOTE — Telephone Encounter (Signed)
Order placed for oxcarbamazepine for 2 wks.

## 2015-12-19 NOTE — Telephone Encounter (Signed)
Per Enid Skeens Martin NP, spoke with patient and informed her that her lab blood level did not detect any trileptal in her blood. Advised she take the medication as directed and return in two weeks for a repeat lab. Gave her office lab hours. She verbalized understanding, appreciation.

## 2015-12-21 ENCOUNTER — Emergency Department (HOSPITAL_COMMUNITY)
Admission: EM | Admit: 2015-12-21 | Discharge: 2015-12-22 | Disposition: A | Discharge status: Home / Self Care | Payer: BLUE CROSS/BLUE SHIELD | Attending: Emergency Medicine | Admitting: Emergency Medicine

## 2015-12-21 ENCOUNTER — Encounter (HOSPITAL_COMMUNITY): Payer: Self-pay | Admitting: Emergency Medicine

## 2015-12-21 DIAGNOSIS — E119 Type 2 diabetes mellitus without complications: Secondary | ICD-10-CM | POA: Insufficient documentation

## 2015-12-21 DIAGNOSIS — Z91040 Latex allergy status: Secondary | ICD-10-CM | POA: Diagnosis not present

## 2015-12-21 DIAGNOSIS — M79662 Pain in left lower leg: Secondary | ICD-10-CM | POA: Diagnosis not present

## 2015-12-21 LAB — CBC WITH DIFFERENTIAL/PLATELET
BASOS PCT: 0 %
Basophils Absolute: 0 10*3/uL (ref 0.0–0.1)
EOS ABS: 0.2 10*3/uL (ref 0.0–0.7)
EOS PCT: 4 %
HCT: 37.3 % (ref 36.0–46.0)
HEMOGLOBIN: 12.5 g/dL (ref 12.0–15.0)
LYMPHS ABS: 2.7 10*3/uL (ref 0.7–4.0)
Lymphocytes Relative: 44 %
MCH: 31.5 pg (ref 26.0–34.0)
MCHC: 33.5 g/dL (ref 30.0–36.0)
MCV: 94 fL (ref 78.0–100.0)
MONOS PCT: 8 %
Monocytes Absolute: 0.5 10*3/uL (ref 0.1–1.0)
NEUTROS PCT: 44 %
Neutro Abs: 2.8 10*3/uL (ref 1.7–7.7)
PLATELETS: 169 10*3/uL (ref 150–400)
RBC: 3.97 MIL/uL (ref 3.87–5.11)
RDW: 12.1 % (ref 11.5–15.5)
WBC: 6.2 10*3/uL (ref 4.0–10.5)

## 2015-12-21 LAB — BASIC METABOLIC PANEL
Anion gap: 7 (ref 5–15)
BUN: 7 mg/dL (ref 6–20)
CALCIUM: 8.9 mg/dL (ref 8.9–10.3)
CHLORIDE: 102 mmol/L (ref 101–111)
CO2: 29 mmol/L (ref 22–32)
CREATININE: 0.51 mg/dL (ref 0.44–1.00)
Glucose, Bld: 79 mg/dL (ref 65–99)
Potassium: 4 mmol/L (ref 3.5–5.1)
SODIUM: 138 mmol/L (ref 135–145)

## 2015-12-21 LAB — I-STAT CG4 LACTIC ACID, ED: Lactic Acid, Venous: <0.3 mmol/L — ABNORMAL LOW (ref 0.5–1.9)

## 2015-12-21 LAB — CK: CK TOTAL: 68 U/L (ref 38–234)

## 2015-12-21 MED ORDER — BUPIVACAINE HCL (PF) 0.5 % IJ SOLN: 10.0000 mL | Freq: Once | INTRAMUSCULAR | Status: DC

## 2015-12-21 NOTE — ED Triage Notes (Signed)
Patient reports left shin pain onset this morning , denies injury , ambulatory , seen at an urgent care this evening sent here for further evaluation , respirations unlabored / no fever or chills.

## 2015-12-21 NOTE — ED Provider Notes (Signed)
[facility]ENCY DEPT Provider Note   CSN: [id number] Arrival date & time: 12/21/15  2027   History   Chief Complaint Chief Complaint  Patient presents with  . Leg Pain    HPI Michelle [name] is a 40 y.o. female.  The history is provided by the patient, medical records and the spouse.     40 year old female with history of fibromyalgia, lupus, interstitial cystitis, diabetes, depression, migraines presenting with leg pain. Onset was today, awakening from sleep in the morning. Located in left lower leg. Radiating to foot and knee. Worse with ambulation or bearing weight. Severe. Somewhat waxing and waning, between 8 and 10. Described as throbbing. Associated with mild discoloration over the shin. Denies any history of injury. No joint pain in knee or ankle. No weakness or numbness or paresthesias in the extremity, no back pain. Not alleviated by elevation, rest, ice, by mouth meds. Patient was seen at orthopedic urgent care today, had negative xray, and was referred here for MRI of the lower leg to evaluate for possible stress fracture, bleed, or other acute pathology.   Past Medical History:  Diagnosis Date  . Depression   . Diabetes (HCC)   . Fibromyalgia   . Migraine   . Muscle cramps     Patient Active Problem List   Diagnosis Date Noted  . Migraine   . Diabetes (HCC)   . Depression   . Fibromyalgia   . Muscle cramps     Past Surgical History:  Procedure Laterality Date  . ABDOMINAL HYSTERECTOMY    . CESAREAN SECTION    . CHOLECYSTECTOMY    . FACET JOINT INJECTION    . GASTRIC BYPASS    . SI Joint Injection      OB History    No data available       Home Medications    Prior to Admission medications   Medication Sig Start Date End Date Taking? Authorizing Provider  albuterol (PROVENTIL HFA) 108 (90 BASE) MCG/ACT inhaler Inhale 1-2 puffs into the lungs every 6 (six) hours as needed for wheezing or shortness of breath. Take as directed.   Yes Historical  Provider, MD  albuterol (PROVENTIL) (5 MG/ML) 0.5% nebulizer solution Take 2.5 mg by nebulization every 6 (six) hours as needed for wheezing or shortness of breath.   Yes Historical Provider, MD  calcipotriene (DOVONOX) 0.005 % cream Apply 1 application topically 2 (two) times daily.    Yes Historical Provider, MD  Calcium Carb-Cholecalciferol (CALCIUM 600 + D PO) Take 1 tablet by mouth daily.   Yes Historical Provider, MD  cetirizine (ZYRTEC) 10 MG tablet Take 10 mg by mouth daily.   Yes Historical Provider, MD  cyclobenzaprine (FLEXERIL) 10 MG tablet Take 10 mg by mouth 3 (three) times daily as needed for muscle spasms.   Yes Historical Provider, MD  diclofenac (FLECTOR) 1.3 % PTCH Place 1 patch onto the skin 2 (two) times daily.   Yes Historical Provider, MD  diltiazem (CARDIZEM) 120 MG tablet Take 120 mg by mouth 2 (two) times daily.    Yes Historical Provider, MD  DULoxetine (CYMBALTA) 20 MG capsule Take 20 mg by mouth every other day.    Yes Historical Provider, MD  eletriptan (RELPAX) 40 MG tablet Take 40 mg by mouth every 2 (two) hours as needed for migraine or headache. One tablet by mouth at onset of headache. May repeat in 2 hours if headache persists or recurs.    Yes Historical Provider, MD  [facility] SURECLICK  50 MG/ML injection Inject 50 mg into the skin once a week. Mondays 11/03/13  Yes Historical Provider, MD  estradiol (ESTRACE) 0.1 MG/GM vaginal cream Place 1 Applicatorful vaginally 3 (three) times a week.   Yes Historical Provider, MD  famotidine (PEPCID) 20 MG tablet Take 40 mg by mouth 2 (two) times daily.    Yes Historical Provider, MD  Fe Fum-FePoly-Vit C-Vit B3 (INTEGRA PO) Take 1 tablet by mouth 2 (two) times daily.    Yes Historical Provider, MD  Gabapentin Enacarbil ER (HORIZANT) 300 MG TBCR Take 300 mg by mouth daily. Taking 300mg  once daily    Yes Historical Provider, MD  hydroxychloroquine (PLAQUENIL) 200 MG tablet Take 200 mg by mouth 2 (two) times daily.    Yes Historical  Provider, MD  levothyroxine (SYNTHROID, LEVOTHROID) 150 MCG tablet Take 150 mcg by mouth daily before breakfast.   Yes Historical Provider, MD  lidocaine (LIDODERM) 5 % Place 1 patch onto the skin daily. Remove & Discard patch within 12 hours or as directed by MD   Yes Historical Provider, MD  meclizine (ANTIVERT) 25 MG tablet Take 25 mg by mouth 3 (three) times daily as needed for dizziness.   Yes Historical Provider, MD  Meth-Hyo-M Bl-Na Phos-Ph Sal (URIBEL) 118 MG CAPS Take 1 capsule by mouth daily.  11/01/13  Yes Historical Provider, MD  mometasone (ELOCON) 0.1 % cream Apply 1 application topically daily.   Yes Historical Provider, MD  mometasone (NASONEX) 50 MCG/ACT nasal spray Place 2 sprays into the nose daily as needed (allergies).    Yes Historical Provider, MD  mometasone-formoterol (DULERA) 200-5 MCG/ACT AERO Inhale 2 puffs into the lungs 2 (two) times daily.   Yes Historical Provider, MD  montelukast (SINGULAIR) 10 MG tablet Take 10 mg by mouth at bedtime.   Yes Historical Provider, MD  morphine (KADIAN) 30 MG 24 hr capsule Take 30-60 mg by mouth 2 (two) times daily. Take 60mg  in am and 30mg  in pm.   Yes Historical Provider, MD  Multiple Vitamin (MULTI-VITAMIN PO) Take 1 tablet by mouth daily.    Yes Historical Provider, MD  omeprazole (PRILOSEC) 20 MG capsule Take 40 mg by mouth 2 (two) times daily before a meal.   Yes Historical Provider, MD  ondansetron (ZOFRAN) 4 MG tablet Take 4 mg by mouth every 8 (eight) hours as needed for nausea or vomiting.   Yes Historical Provider, MD  OXcarbazepine (TRILEPTAL) 150 MG tablet Take 1 tablet (150 mg total) by mouth 2 (two) times daily. 07/09/15  Yes Nilda Riggs, NP  oxyCODONE-acetaminophen (PERCOCET) 10-325 MG per tablet Take 1 tablet by mouth every 6 (six) hours as needed for pain. as needed. 01/19/14  Yes Historical Provider, MD  pentosan polysulfate (ELMIRON) 100 MG capsule Take 100 mg by mouth 2 (two) times daily.   Yes Historical  Provider, MD  pimecrolimus (ELIDEL) 1 % cream Apply 1 application topically 2 (two) times daily.    Yes Historical Provider, MD  ranitidine (ZANTAC) 300 MG capsule Take 300 mg by mouth every evening.   Yes Historical Provider, MD  tamsulosin (FLOMAX) 0.4 MG CAPS capsule Take 0.4 mg by mouth every evening.    Yes Historical Provider, MD  tiZANidine (ZANAFLEX) 4 MG tablet Take 4-8 mg by mouth at bedtime.   Yes Historical Provider, MD  Vitamin D, Ergocalciferol, (DRISDOL) 50000 UNITS CAPS capsule Take 50,000 Units by mouth 3 (three) times a week.  09/29/13  Yes Historical Provider, MD  zolmitriptan (ZOMIG) 5  MG nasal solution Place 1 spray into the nose daily as needed for migraine.    Yes Historical Provider, MD    Family History No family history on file.  Social History Social History  Substance Use Topics  . Smoking status: Never Smoker  . Smokeless tobacco: Never Used  . Alcohol use No     Allergies   Latex and Tape   Review of Systems Review of Systems  Constitutional: Negative for fever.  Respiratory: Negative for cough and shortness of breath.   Cardiovascular: Negative for chest pain and leg swelling.  Gastrointestinal: Negative for abdominal pain and vomiting.  Musculoskeletal: Positive for myalgias. Negative for arthralgias and back pain.  Skin: Positive for color change.  Allergic/Immunologic: Negative for immunocompromised state.  Neurological: Negative for weakness and numbness.  Hematological: Does not bruise/bleed easily.  All other systems reviewed and are negative.    Physical Exam Updated Vital Signs BP 107/76   Pulse 69   Temp 98.3 F (36.8 C) (Oral)   Resp 16   Ht 5\' 4"  (1.626 m)   Wt 56.2 kg   SpO2 98%   BMI 21.28 kg/m   Physical Exam  Constitutional: She appears well-developed and well-nourished. No distress.  HENT:  Head: Normocephalic and atraumatic.  Eyes: Conjunctivae are normal.  Neck: Neck supple.  Cardiovascular: Normal rate,  regular rhythm and intact distal pulses.   No murmur heard. Pulmonary/Chest: Effort normal and breath sounds normal. No respiratory distress.  Abdominal: Soft. There is no tenderness.  Musculoskeletal: Normal range of motion. She exhibits tenderness. She exhibits no edema.  Left lower extremity atraumatic, no pain with ROM at knee or ankle, no joint effusions or increased warmth or erythema. TTP over anterior shin muscle bodies. 2+ DP pulse and PT pulse, WWP in toes. Sensation intact throughout the leg. Compartments are soft. Intact strength distally in toes, feet, ankle. No edema of extremity.    Neurological: She is alert.  Skin: Skin is warm and dry.  Psychiatric: She has a normal mood and affect.  Nursing note and vitals reviewed.    ED Treatments / Results  Labs (all labs ordered are listed, but only abnormal results are displayed) Labs Reviewed  I-STAT CG4 LACTIC ACID, ED - Abnormal; Notable for the following:       Result Value   Lactic Acid, Venous <0.30 (*)    All other components within normal limits  CBC WITH DIFFERENTIAL/PLATELET  BASIC METABOLIC PANEL  CK    EKG  EKG Interpretation None       Radiology No results found.  Procedures Procedures (including critical care time)  Medications Ordered in ED Medications - No data to display   Initial Impression / Assessment and Plan / ED Course  I have reviewed the triage vital signs and the nursing notes.  Pertinent labs & imaging results that were available during my care of the patient were reviewed by me and considered in my medical decision making (see chart for details).  Clinical Course    40 year old female with history of fibromyalgia, lupus, interstitial cystitis, diabetes, depression, migraines presenting with atraumatic LLE pain, as above. Afebrile with stable vital signs. Closed joints, extremity neurovascularly intact with soft compartments. No evidence of acute infectious pathology and no lactic  acidosis, normal CK, no leukocytosis. Xrays done earlier were reportedly negative. Clinically there is no compartment syndrome. Stress fracture possible but does not require emergent MRI at this time to determine. Exam is more c/w muscular pain. Improved  with trigger point injection- see procedure as documented by Dr Clydene PughKnott. Return precautions discussed. Dc in stable condition with RICE therapy and f/u with PCP.   Case discussed with Dr. Clydene PughKnott, who oversaw management of this patient.    Final Clinical Impressions(s) / ED Diagnoses   Final diagnoses:  Pain of left lower leg    New Prescriptions Discharge Medication List as of 12/22/2015 12:00 AM       Urban GibsonJenny Khylah Kendra, MD 12/22/15 2218    Lyndal Pulleyaniel Knott, MD 12/22/15 [phone]

## 2015-12-22 NOTE — ED Provider Notes (Signed)
I saw and evaluated the patient, reviewed the resident's note and I agree with the findings and plan. Please see associated encounter note.   EKG Interpretation None      40 y.o. female presents with nontraumatic left anterior shin pain. Sent from outside facility with concern for compartment syndrome but compartments soft, tender focally over shin. Plain film negative from previous visit. Local anaesthesia applied with good relief. Plan to follow up with PCP as needed and return precautions discussed for worsening or new concerning symptoms.   Procedure Note: Trigger Point Injection for Myofascial pain  Performed by Dr. Clydene PughKnott Indication: muscle/myofascial pain Muscle body and tendon sheath of the left anterior tibialis muscle(s) were injected with 0.5% bupivacaine under sterile technique for release of muscle spasm/pain. Patient tolerated well with immediate improvement of symptoms and no immediate complications following procedure.  CPT Code:   1 or 2 muscle bodies: [id number]    Lyndal Pulleyaniel Chelsey Kimberley, MD 12/22/15 [phone]

## 2016-01-18 ENCOUNTER — Encounter: Payer: Medicare Other | Admitting: Neurology

## 2016-02-01 ENCOUNTER — Encounter: Payer: Medicare Other | Admitting: Neurology

## 2016-02-13 ENCOUNTER — Telehealth: Payer: Self-pay | Admitting: Radiology

## 2016-02-13 NOTE — Telephone Encounter (Signed)
I have discussed with pharmacy, to clarify where the RX came form, this was submitted in July. I have asked the pharmacy to hold the RX ([facility]). I have called patient to advise, she will go this week

## 2016-02-13 NOTE — Telephone Encounter (Signed)
I have discussed with pharmacy/ ok for Enbrel Sureclick, there is a question about her latex allergy, patient has used this for a long duration without difficulty. The pharmacy called me, then put me on long hold for pharmacist. I can not continue to hold to advise someone else.   Patient is due for labs for her Enbrel .

## 2016-02-20 ENCOUNTER — Ambulatory Visit (INDEPENDENT_AMBULATORY_CARE_PROVIDER_SITE_OTHER): Payer: BLUE CROSS/BLUE SHIELD | Admitting: Neurology

## 2016-02-20 ENCOUNTER — Ambulatory Visit (INDEPENDENT_AMBULATORY_CARE_PROVIDER_SITE_OTHER): Payer: Self-pay | Admitting: Neurology

## 2016-02-20 DIAGNOSIS — Z0289 Encounter for other administrative examinations: Secondary | ICD-10-CM

## 2016-02-20 DIAGNOSIS — R200 Anesthesia of skin: Secondary | ICD-10-CM

## 2016-02-20 DIAGNOSIS — R269 Unspecified abnormalities of gait and mobility: Secondary | ICD-10-CM

## 2016-02-20 DIAGNOSIS — R252 Cramp and spasm: Secondary | ICD-10-CM

## 2016-02-20 DIAGNOSIS — R202 Paresthesia of skin: Secondary | ICD-10-CM | POA: Diagnosis not present

## 2016-02-20 MED ORDER — BACLOFEN 10 MG PO TABS: 10.0000 mg | ORAL_TABLET | Freq: Every day | ORAL | 1 refills | Status: DC

## 2016-02-20 NOTE — Procedures (Addendum)
        Full Name: Rudene ReMarlena Lochridge Gender: Female MRN #: [id number] Date of Birth: 02/26/1976    Visit Date: 02/20/2016 16:01 Age: [age] Years 4 Months Old Examining Physician: Levert FeinsteinYijun Sharlize Hoar, MD   History: 40 years old female presented with frequent intermittent muscle cramping involving right arm and leg more than the right and left side  Summary: Nerve conduction study: Right median, ulnar sensory and motor study were normal. Right sural, peroneal sensory responses were normal. Right peroneal to EDB and the right tibial motor responses were normal.  Electromyography: Selected needle examination of right upper, lower extremity muscles, right cervical and lumbosacral paraspinal muscles were normal.   Conclusion: This is a normal study, there is no electrodiagnostic evidence of right upper or lower extremity neuropathy, there is no electrodiagnostic evidence of right cervical or right lumbosacral radiculopathy.    ------------------------------- Levert FeinsteinYijun Jovana Rembold, M.D.  [facility] [address] [place], [place] [address] Tel: [phone] Fax: [phone]         EMG       EMG Summary Table    Spontaneous MUAP Recruitment  Muscle IA Fib PSW Fasc Other Amp Dur. Poly Pattern  R. Tibialis anterior Normal None None None _______ Normal Normal Normal Normal  R. Gastrocnemius (Medial head) Normal None None None _______ Normal Normal Normal Normal  R. Vastus lateralis Normal None None None _______ Normal Normal Normal Normal  R. Deltoid Normal None None None _______ Normal Normal Normal Normal  R. Biceps brachii Normal None None None _______ Normal Normal Normal Normal  R. Pronator teres Normal None None None _______ Normal Normal Normal Normal  R. Lumbar paraspinals (low) Normal None None None _______ Normal Normal Normal Normal  R. Cervical paraspinals Normal None None None _______ Normal Normal Normal Normal  R. Lumbar paraspinals (mid) Normal None None None _______ Normal  Normal Normal Normal  R. Extensor digitorum communis Normal None None None _______ Normal Normal Normal Normal

## 2016-02-20 NOTE — Progress Notes (Signed)
Electrodiagnostic study report is under procedure section, nerve conduction study  is scanned separately.

## 2016-02-22 ENCOUNTER — Other Ambulatory Visit: Payer: Self-pay | Admitting: Rheumatology

## 2016-02-22 ENCOUNTER — Telehealth: Payer: Self-pay | Admitting: *Deleted

## 2016-02-22 LAB — CBC WITH DIFFERENTIAL/PLATELET
BASOS ABS: 0 {cells}/uL (ref 0–200)
Basophils Relative: 0 %
EOS PCT: 2 %
Eosinophils Absolute: 106 cells/uL (ref 15–500)
HEMATOCRIT: 37.1 % (ref 35.0–45.0)
Hemoglobin: 12.6 g/dL (ref 11.7–15.5)
LYMPHS PCT: 35 %
Lymphs Abs: 1855 cells/uL (ref 850–3900)
MCH: 31.3 pg (ref 27.0–33.0)
MCHC: 34 g/dL (ref 32.0–36.0)
MCV: 92.3 fL (ref 80.0–100.0)
MPV: 9.9 fL (ref 7.5–12.5)
Monocytes Absolute: 477 cells/uL (ref 200–950)
Monocytes Relative: 9 %
NEUTROS PCT: 54 %
Neutro Abs: 2862 cells/uL (ref 1500–7800)
Platelets: 184 10*3/uL (ref 140–400)
RBC: 4.02 MIL/uL (ref 3.80–5.10)
RDW: 12.7 % (ref 11.0–15.0)
WBC: 5.3 10*3/uL (ref 3.8–10.8)

## 2016-02-22 NOTE — Telephone Encounter (Signed)
-----   Message from Nilda RiggsNancy Carolyn Martin, NP sent at 02/21/2016  9:13 AM EDT ----- EMG Philipp Deputy/Blanket normal please call the patient

## 2016-02-22 NOTE — Telephone Encounter (Signed)
Spoke to pt and relayed that her [place]/EMG study was normal.  She verbalized understanding.

## 2016-02-23 LAB — COMPLETE METABOLIC PANEL WITHOUT GFR
ALT: 51 U/L — ABNORMAL HIGH (ref 6–29)
AST: 27 U/L (ref 10–30)
Albumin: 4.1 g/dL (ref 3.6–5.1)
Alkaline Phosphatase: 84 U/L (ref 33–115)
BUN: 11 mg/dL (ref 7–25)
CO2: 26 mmol/L (ref 20–31)
Calcium: 8.6 mg/dL (ref 8.6–10.2)
Chloride: 104 mmol/L (ref 98–110)
Creat: 0.58 mg/dL (ref 0.50–1.10)
GFR, Est African American: >89 mL/min
GFR, Est Non African American: >89 mL/min
Glucose, Bld: 132 mg/dL — ABNORMAL HIGH (ref 65–99)
Potassium: 3.8 mmol/L (ref 3.5–5.3)
Sodium: 139 mmol/L (ref 135–146)
Total Bilirubin: 0.4 mg/dL (ref 0.2–1.2)
Total Protein: 6.5 g/dL (ref 6.1–8.1)

## 2016-02-25 ENCOUNTER — Telehealth: Payer: Self-pay | Admitting: Rheumatology

## 2016-02-25 MED ORDER — ETANERCEPT 50 MG/ML ~~LOC~~ SOAJ: 50.0000 mg | SUBCUTANEOUS | 0 refills | Status: DC

## 2016-02-25 NOTE — Telephone Encounter (Signed)
I got her labs today, Dr Corliss Skainseveshwar has approved refill One of her liver function tests are elevated Will call her

## 2016-02-25 NOTE — Telephone Encounter (Signed)
Patient's husband states he tried calling in Enbrel rx and was told the rx had been cancelled. He would like to know what is going on. He also would like to know if he can come by today to pick up a sample injection since it will be a few days before the Enbrel will be shipped to them.

## 2016-02-26 ENCOUNTER — Telehealth: Payer: Self-pay | Admitting: Radiology

## 2016-02-26 ENCOUNTER — Other Ambulatory Visit: Payer: Self-pay | Admitting: Rheumatology

## 2016-02-26 NOTE — Telephone Encounter (Signed)
Patient needs a refill of Enbrel sent to [facility].  Patient is out for two weeks. Do we have any samples? Pt [facility] Friday [facility]

## 2016-02-26 NOTE — Telephone Encounter (Signed)
-----   Message from Pollyann SavoyShaili Deveshwar, MD sent at 02/25/2016  1:46 PM EST ----- Please call patient to discuss her elevated ALTs. She should avoid anti-inflammatories and alcohol intake.

## 2016-02-26 NOTE — Telephone Encounter (Signed)
I have advised patient 

## 2016-02-27 NOTE — Telephone Encounter (Signed)
Patient told me yesterday she will get shipment today

## 2016-03-24 ENCOUNTER — Other Ambulatory Visit: Payer: Self-pay | Admitting: Rheumatology

## 2016-03-24 LAB — COMPLETE METABOLIC PANEL WITH GFR
ALBUMIN: 3.9 g/dL (ref 3.6–5.1)
ALK PHOS: 85 U/L (ref 33–115)
ALT: 43 U/L — ABNORMAL HIGH (ref 6–29)
AST: 34 U/L — ABNORMAL HIGH (ref 10–30)
BUN: 13 mg/dL (ref 7–25)
CHLORIDE: 105 mmol/L (ref 98–110)
CO2: 27 mmol/L (ref 20–31)
Calcium: 8.6 mg/dL (ref 8.6–10.2)
Creat: 0.65 mg/dL (ref 0.50–1.10)
GFR, Est African American: >89 mL/min (ref >=60)
GLUCOSE: 96 mg/dL (ref 65–99)
POTASSIUM: 4 mmol/L (ref 3.5–5.3)
SODIUM: 140 mmol/L (ref 135–146)
Total Bilirubin: 0.3 mg/dL (ref 0.2–1.2)
Total Protein: 6 g/dL — ABNORMAL LOW (ref 6.1–8.1)

## 2016-03-24 LAB — CBC WITH DIFFERENTIAL/PLATELET
BASOS ABS: 0 {cells}/uL (ref 0–200)
Basophils Relative: 0 %
EOS PCT: 5 %
Eosinophils Absolute: 230 cells/uL (ref 15–500)
HEMATOCRIT: 36.2 % (ref 35.0–45.0)
HEMOGLOBIN: 12 g/dL (ref 11.7–15.5)
LYMPHS ABS: 2300 {cells}/uL (ref 850–3900)
Lymphocytes Relative: 50 %
MCH: 31 pg (ref 27.0–33.0)
MCHC: 33.1 g/dL (ref 32.0–36.0)
MCV: 93.5 fL (ref 80.0–100.0)
MONO ABS: 460 {cells}/uL (ref 200–950)
MPV: 10.3 fL (ref 7.5–12.5)
Monocytes Relative: 10 %
NEUTROS ABS: 1610 {cells}/uL (ref 1500–7800)
Neutrophils Relative %: 35 %
Platelets: 186 10*3/uL (ref 140–400)
RBC: 3.87 MIL/uL (ref 3.80–5.10)
RDW: 13.1 % (ref 11.0–15.0)
WBC: 4.6 10*3/uL (ref 3.8–10.8)

## 2016-03-25 NOTE — Progress Notes (Signed)
Will continue to monitor labs. Please advise patient to avoid anti-inflammatories, Tylenol and alcohol

## 2016-04-01 ENCOUNTER — Ambulatory Visit: Payer: BLUE CROSS/BLUE SHIELD | Admitting: Rheumatology

## 2016-04-10 ENCOUNTER — Other Ambulatory Visit: Payer: Self-pay | Admitting: Rheumatology

## 2016-04-10 NOTE — Telephone Encounter (Signed)
Last Visit: 10/26/15 Next Visit: 04/18/16 Labs: 03/24/16 Elevated AST and ALT PLQ Eye Exam: 12/13/15 WNL  Okay to refill PLQ?

## 2016-04-10 NOTE — Telephone Encounter (Signed)
Okay 

## 2016-04-17 DIAGNOSIS — M5136 Other intervertebral disc degeneration, lumbar region: Secondary | ICD-10-CM | POA: Insufficient documentation

## 2016-04-17 DIAGNOSIS — M5030 Other cervical disc degeneration, unspecified cervical region: Secondary | ICD-10-CM | POA: Insufficient documentation

## 2016-04-17 DIAGNOSIS — L4050 Arthropathic psoriasis, unspecified: Secondary | ICD-10-CM | POA: Insufficient documentation

## 2016-04-17 DIAGNOSIS — M51369 Other intervertebral disc degeneration, lumbar region without mention of lumbar back pain or lower extremity pain: Secondary | ICD-10-CM | POA: Insufficient documentation

## 2016-04-17 DIAGNOSIS — Z8639 Personal history of other endocrine, nutritional and metabolic disease: Secondary | ICD-10-CM | POA: Insufficient documentation

## 2016-04-17 DIAGNOSIS — Z8669 Personal history of other diseases of the nervous system and sense organs: Secondary | ICD-10-CM | POA: Insufficient documentation

## 2016-04-17 DIAGNOSIS — L409 Psoriasis, unspecified: Secondary | ICD-10-CM | POA: Insufficient documentation

## 2016-04-17 DIAGNOSIS — R768 Other specified abnormal immunological findings in serum: Secondary | ICD-10-CM | POA: Insufficient documentation

## 2016-04-17 DIAGNOSIS — K760 Fatty (change of) liver, not elsewhere classified: Secondary | ICD-10-CM | POA: Insufficient documentation

## 2016-04-17 DIAGNOSIS — R5383 Other fatigue: Secondary | ICD-10-CM | POA: Insufficient documentation

## 2016-04-17 NOTE — Progress Notes (Signed)
Office Visit Note  Patient: Michelle [name]             Date of Birth: 12/11/1975           MRN: [id number]             PCP: Lester CarolinaMCFADDEN,JOHN C, MD Referring: Lester CarolinaMcFadden, John C., MD Visit Date: 04/18/2016 Occupation: @GUAROCC @    Subjective:  Pain of the Left Wrist; Pain of the Right Wrist; Pain of the Right Ankle; and Pain of the Left Ankle   History of Present Illness: Michelle [name] is a 40 y.o. female  Last seen 10/26/2015  Patient's psoriatic arthritis was well addressed with Enbrel every week and Plaquenil 200 mg in the morning and 100 at night. For the last few months now, patient states that she starting to flare with psoriatic arthritis pain about 2 days before her next injection is due. She is finding it uncomfortable/painful to some of her joints. Her psoriasis is doing well. She is compliant with her medication intake. She has not missed any doses of her Enbrel or her Plaquenil.  Currently she is having left wrist pain and bilateral ankle pain which she rates as 7 on a scale of 0-10. She is also having swelling to her midline of her lumbar back. She's having right second MCP and right first MCP with synovial thickening and pain which she rates as 4-5 on a scale of 0-10  On the last visit, patient's bilateral trapezius muscles were problematic and we gave her an injection. Patient states that the injection did help.  She's having muscle spasms for which she seeing a neurologist. He prescribed her baclofen but it is not helpful.  Patient had [facility] December 2017 [facility].  Activities of Daily Living:  Patient reports morning stiffness for 30 minutes.   Patient Denies nocturnal pain.  Difficulty dressing/grooming: Reports Difficulty climbing stairs: Reports Difficulty getting out of chair: Denies Difficulty using hands for taps, buttons, cutlery, and/or writing: Reports   Review of Systems  Constitutional: Positive for fatigue.  HENT:  Negative for mouth sores and mouth dryness.   Eyes: Negative for dryness.  Respiratory: Negative for shortness of breath.   Gastrointestinal: Negative for constipation and diarrhea.  Musculoskeletal: Positive for myalgias and myalgias.  Skin: Negative for sensitivity to sunlight.  Psychiatric/Behavioral: Positive for sleep disturbance. Negative for decreased concentration.    PMFS History:  Patient Active Problem List   Diagnosis Date Noted  . Psoriatic arthropathy (HCC) 04/17/2016  . Psoriasis 04/17/2016  . ANA positive 04/17/2016  . Other fatigue 04/17/2016  . DJD (degenerative joint disease), cervical 04/17/2016  . Spondylosis of lumbar region without myelopathy or radiculopathy 04/17/2016  . History of diabetes mellitus 04/17/2016  . History of migraine 04/17/2016  . Fatty liver 04/17/2016  . Migraine   . Diabetes (HCC)   . Depression   . Fibromyalgia   . Muscle cramps     Past Medical History:  Diagnosis Date  . Depression   . Diabetes (HCC)   . Fibromyalgia   . Migraine   . Muscle cramps     No family history on file. Past Surgical History:  Procedure Laterality Date  . ABDOMINAL HYSTERECTOMY    . CESAREAN SECTION    . CHOLECYSTECTOMY    . FACET JOINT INJECTION    . GASTRIC BYPASS    . SI Joint Injection     Social History   Social History Narrative   Patient lives  at home alone with her husband and she works full time disability .    Right handed.   Caffeine one times per day.     Objective: Vital Signs: BP 112/60   Pulse 72   Resp 14   Ht 5\' 4"  (1.626 m)   Wt 132 lb (59.9 kg)   BMI 22.66 kg/m    Physical Exam  Constitutional: She is oriented to person, place, and time. She appears well-developed and well-nourished.  HENT:  Head: Normocephalic and atraumatic.  Eyes: EOM are normal. Pupils are equal, round, and reactive to light.  Cardiovascular: Normal rate, regular rhythm and normal heart sounds.  Exam reveals no gallop and no friction rub.    No murmur heard. Pulmonary/Chest: Effort normal and breath sounds normal. She has no wheezes. She has no rales.  Abdominal: Soft. Bowel sounds are normal. She exhibits no distension. There is no tenderness. There is no guarding. No hernia.  Musculoskeletal: Normal range of motion. She exhibits no edema, tenderness or deformity.  Lymphadenopathy:    She has no cervical adenopathy.  Neurological: She is alert and oriented to person, place, and time. Coordination normal.  Skin: Skin is warm and dry. Capillary refill takes less than 2 seconds. No rash noted.  Psychiatric: She has a normal mood and affect. Her behavior is normal.     Musculoskeletal Exam:  Decreased range of motion of left shoulder joint with about 45 of abduction otherwise good range of motion of other joints. Grip strength is equal and strong bilaterally Fibromyalgia tender points are about 12 out of 18 positive.  CDAI Exam: CDAI Homunculus Exam:   Tenderness:  LUE: wrist RLE: tibiotalar LLE: tibiotalar  Swelling:  LUE: wrist RLE: tibiotalar LLE: tibiotalar  Joint Counts:  CDAI Tender Joint count: 1 CDAI Swollen Joint count: 1  Global Assessments:  Patient Global Assessment: 7 Provider Global Assessment: 7  CDAI Calculated Score: 16    Investigation: Findings:  Plaquenil eye exam is normal as of February 2017 (per patient ) 06/19/2015 negative TB gold   09/26/2013 Right knee joint pain.  X-rays of bilateral knee joints show mild medial compartment narrowing, mild patellofemoral joint space narrowing, no CPPD.  11/26/2009 .  X-ray of bilateral hands in the office today showed right first MCP narrowing and bilateral PIP narrowing without any erosions.  Bilateral feet x-rays showed bilateral PIP and DIP narrowing which can be consistent with osteoarthritis or early psoriatic arthritis.  We also obtained a pelvis x-ray since she was having a lot of SI joint discomfort.  It showed normal space but there was  questionable erosion in the left SI joint base.  A baseline chest x-ray was also obtained PA and lateral which was within normal limits.      Imaging: No results found.  Speciality Comments: No specialty comments available.    Procedures:  No procedures performed Allergies: Duloxetine; Flunisolide; Vilazodone; Latex; and Tape   Assessment / Plan:     Visit Diagnoses: Psoriatic arthropathy (HCC) - Plan: Ambulatory referral to Physical Therapy  Psoriasis  Fibromyalgia - Plan: Ambulatory referral to Physical Therapy  ANA positive  High risk medications (not anticoagulants) long-term use  Other fatigue  DJD (degenerative joint disease), cervical  Spondylosis of lumbar region without myelopathy or radiculopathy  History of diabetes mellitus  History of migraine  Fatty liver   High risk medication use  History of Diabetes  History of Fatty liver disease   Plan: #1: Patient psoriatic arthritis is not  well-controlled as a result of inadequate response to Enbrel. Patient has been having pain starting on day 5 after her Enbrel injection. Over day 6 it becomes intolerable. Then she feels better after she gets her Enbrel injection until day 5 when she starts to hurt once again. She has been compliant with her Enbrel as well as her Plaquenil and has had good success with the medication when she initially started previously.  #2: Bilateral ankle joint pain and left wrist joint pain and right first and second MCP pain currently. (Flare of her psoriatic arthritis).  #3: Refill Plaquenil, Plaquenil eye exam is normal and up-to-date as of February 2017 and will be due again in February 2018  #4. Prescription Robaxin 500 mg; 1 by mouth twice a day when necessary; 30 day supply with 2 refills. (Stop baclofen).  #5: Prednisone 5 mg taper; 4 pills every morning for 4 days, 3 for 4 days, 2 for 4 days, 1 for 4 days, half for 4 days,; use until all gone.  #6: Physical therapy evaluate  and treat: Fibromyalgia pain, left shoulder joint pain with decreased range of motion.  #7: Patient will need to see her eye doctor regarding her eye focusing problem that she has at times. It is not related to the Plaquenil toxicity based on patient's history.  #8: Return to clinic in 3 months. At that point we will discuss if her Enbrel is still inadequate response.  #8: Patient will be due for repeat blood work of CBC with differential CMP with GFR in February/March 2018. [facility] which is more convenient for her.  Orders: Orders Placed This Encounter  Procedures  . Ambulatory referral to Physical Therapy   No orders of the defined types were placed in this encounter.   Face-to-face time spent with patient was 40 minutes. 50% of time was spent in counseling and coordination of care.  Follow-Up Instructions: Return in about 3 months (around 07/17/2016) for PsA, Ps, enbrel (inadeq) / PLQ 200 100; l. wrist pain; b. ankle pain.   Tawni PummelNaitik Kaitelyn Jamison, PA-C

## 2016-04-18 ENCOUNTER — Encounter: Payer: Self-pay | Admitting: Rheumatology

## 2016-04-18 ENCOUNTER — Ambulatory Visit (INDEPENDENT_AMBULATORY_CARE_PROVIDER_SITE_OTHER): Payer: BLUE CROSS/BLUE SHIELD | Admitting: Rheumatology

## 2016-04-18 VITALS — BP 112/60 | HR 72 | Resp 14 | Ht 64.0 in | Wt 132.0 lb

## 2016-04-18 DIAGNOSIS — M5030 Other cervical disc degeneration, unspecified cervical region: Secondary | ICD-10-CM

## 2016-04-18 DIAGNOSIS — R768 Other specified abnormal immunological findings in serum: Secondary | ICD-10-CM | POA: Diagnosis not present

## 2016-04-18 DIAGNOSIS — R5383 Other fatigue: Secondary | ICD-10-CM | POA: Diagnosis not present

## 2016-04-18 DIAGNOSIS — Z8639 Personal history of other endocrine, nutritional and metabolic disease: Secondary | ICD-10-CM

## 2016-04-18 DIAGNOSIS — M47812 Spondylosis without myelopathy or radiculopathy, cervical region: Secondary | ICD-10-CM

## 2016-04-18 DIAGNOSIS — Z8669 Personal history of other diseases of the nervous system and sense organs: Secondary | ICD-10-CM | POA: Diagnosis not present

## 2016-04-18 DIAGNOSIS — L4050 Arthropathic psoriasis, unspecified: Secondary | ICD-10-CM | POA: Diagnosis not present

## 2016-04-18 DIAGNOSIS — Z79899 Other long term (current) drug therapy: Secondary | ICD-10-CM | POA: Diagnosis not present

## 2016-04-18 DIAGNOSIS — L409 Psoriasis, unspecified: Secondary | ICD-10-CM

## 2016-04-18 DIAGNOSIS — M47816 Spondylosis without myelopathy or radiculopathy, lumbar region: Secondary | ICD-10-CM | POA: Diagnosis not present

## 2016-04-18 DIAGNOSIS — M797 Fibromyalgia: Secondary | ICD-10-CM | POA: Diagnosis not present

## 2016-04-18 DIAGNOSIS — K760 Fatty (change of) liver, not elsewhere classified: Secondary | ICD-10-CM

## 2016-04-18 DIAGNOSIS — R7689 Other specified abnormal immunological findings in serum: Secondary | ICD-10-CM

## 2016-04-22 ENCOUNTER — Telehealth: Payer: Self-pay | Admitting: Rheumatology

## 2016-04-22 MED ORDER — PREDNISONE 5 MG PO TABS: ORAL_TABLET | ORAL | 0 refills | Status: DC

## 2016-04-22 NOTE — Telephone Encounter (Signed)
Prednisone 5 mg taper; 4 pills every morning for 4 days, 3 for 4 days, 2 for 4 days, 1 for 4 days, half for 4 days,; use until all gone. Per Mr Michelle [name], ok to send in he intended to send at office visit, but it did not get sent.

## 2016-04-22 NOTE — Telephone Encounter (Signed)
Patient is still waiting on her Rx prednisone pack to be called in @ [facility].  She states she's checked with them several times since her visit w/Panwala.  Her wrists are bothering her and really needs this Rx ASAP.

## 2016-04-30 ENCOUNTER — Other Ambulatory Visit: Payer: Self-pay | Admitting: Rheumatology

## 2016-04-30 NOTE — Telephone Encounter (Signed)
Last Visit: 04/18/16 Next Visit: 07/15/16 Labs: 03/24/16- AST 34, ALT 43 TB Gold: 06/19/15 Neg  Okay to refill Enbrel?

## 2016-05-14 ENCOUNTER — Ambulatory Visit: Payer: BLUE CROSS/BLUE SHIELD | Admitting: Physical Therapy

## 2016-07-04 ENCOUNTER — Telehealth: Payer: Self-pay | Admitting: Pharmacist

## 2016-07-04 DIAGNOSIS — Z79899 Other long term (current) drug therapy: Secondary | ICD-10-CM

## 2016-07-04 NOTE — Telephone Encounter (Signed)
Received an alert from patient's insurance regarding drug safety consideration between duloxetine and urinary retention.  I called the patient to discuss.  Patient confirms she does have difficulty with urinary retention.  She is on duloxetine for fibromyalgia.  She is currently taking duloxetine 20 mg every other day.  She reports her urinary retention is doing ok on this low dose.  I counseled patient that duloxetine has been associated with urinary retention and could be making her problem worse.    Would consider trial off duloxetine to see if urinary retention improves.  Could consider trial of gabapentin instead.  Patient is scheduled for office visit on 07/15/16.   Also noted patient is due for labs (standing labs and TB Gold).  Patient confirms she plans to go on Monday, 07/07/16.    Lilla Shookachel Henderson, Pharm.D., BCPS, CPP Clinical Pharmacist Pager: [phone] Phone: [phone] 07/04/2016 4:17 PM

## 2016-07-07 ENCOUNTER — Ambulatory Visit: Payer: BLUE CROSS/BLUE SHIELD | Admitting: Nurse Practitioner

## 2016-07-10 ENCOUNTER — Telehealth: Payer: Self-pay | Admitting: Rheumatology

## 2016-07-10 ENCOUNTER — Other Ambulatory Visit: Payer: Self-pay | Admitting: *Deleted

## 2016-07-10 DIAGNOSIS — Z79899 Other long term (current) drug therapy: Secondary | ICD-10-CM

## 2016-07-10 LAB — CBC WITH DIFFERENTIAL/PLATELET
BASOS ABS: 0 {cells}/uL (ref 0–200)
Basophils Relative: 0 %
EOS ABS: 144 {cells}/uL (ref 15–500)
EOS PCT: 3 %
HCT: 35.6 % (ref 35.0–45.0)
Hemoglobin: 12 g/dL (ref 11.7–15.5)
LYMPHS PCT: 49 %
Lymphs Abs: 2352 cells/uL (ref 850–3900)
MCH: 31.7 pg (ref 27.0–33.0)
MCHC: 33.7 g/dL (ref 32.0–36.0)
MCV: 94.2 fL (ref 80.0–100.0)
MONOS PCT: 11 %
MPV: 9.9 fL (ref 7.5–12.5)
Monocytes Absolute: 528 cells/uL (ref 200–950)
NEUTROS ABS: 1776 {cells}/uL (ref 1500–7800)
Neutrophils Relative %: 37 %
PLATELETS: 165 10*3/uL (ref 140–400)
RBC: 3.78 MIL/uL — ABNORMAL LOW (ref 3.80–5.10)
RDW: 13.1 % (ref 11.0–15.0)
WBC: 4.8 10*3/uL (ref 3.8–10.8)

## 2016-07-10 NOTE — Telephone Encounter (Signed)
Patient @ [facility] now.  Please fax standing orders to the following number:  [phone]

## 2016-07-10 NOTE — Telephone Encounter (Signed)
Labs released and faxed  

## 2016-07-11 LAB — COMPLETE METABOLIC PANEL WITHOUT GFR
ALT: 30 U/L — ABNORMAL HIGH (ref 6–29)
AST: 28 U/L (ref 10–30)
Albumin: 3.9 g/dL (ref 3.6–5.1)
Alkaline Phosphatase: 98 U/L (ref 33–115)
BUN: 8 mg/dL (ref 7–25)
CO2: 28 mmol/L (ref 20–31)
Calcium: 8.4 mg/dL — ABNORMAL LOW (ref 8.6–10.2)
Chloride: 106 mmol/L (ref 98–110)
Creat: 0.58 mg/dL (ref 0.50–1.10)
GFR, Est African American: >89 mL/min
GFR, Est Non African American: >89 mL/min
Glucose, Bld: 71 mg/dL (ref 65–99)
Potassium: 4 mmol/L (ref 3.5–5.3)
Sodium: 143 mmol/L (ref 135–146)
Total Bilirubin: 0.3 mg/dL (ref 0.2–1.2)
Total Protein: 6.1 g/dL (ref 6.1–8.1)

## 2016-07-14 DIAGNOSIS — Z79899 Other long term (current) drug therapy: Secondary | ICD-10-CM | POA: Insufficient documentation

## 2016-07-14 LAB — QUANTIFERON TB GOLD ASSAY (BLOOD)
Interferon Gamma Release Assay: NEGATIVE
QUANTIFERON NIL VALUE: 0.03 [IU]/mL
Quantiferon Tb Ag Minus Nil Value: 0.04 IU/mL

## 2016-07-14 NOTE — Progress Notes (Signed)
Office Visit Note  Patient: Michelle [name]             Date of Birth: 1975-07-02           MRN: [id number]             PCP: Houston Siren, MD Referring: Houston Siren., MD Visit Date: 07/15/2016 Occupation: @GUAROCC @    Subjective:  Follow-up   History of Present Illness: Michelle [name] is a 41 y.o. female   Last seen 04/18/2016  Patient has psoriasis and psoriatic arthritis and they're well addressed with Enbrel every week and Plaquenil 200 mg in the morning and 100 mg at night Monday through Sunday. (7 days a wk).  Most recently patient's sought out a cardiologist after being referred by the PCP. She saw the PCP for lower leg swelling. When the blood work did not show anything, the cardiologist did an ultrasound to rule out DVT. She was having more swelling in the right lower leg that the left lower leg. According to the patient, the ultrasound ruled out DVT.  We discussed the possibility of her horizontal, gabapentin, 3 mg daily may be causing her fluid retention/swelling in her lower legs. At this time to have an offered her any Lasix. She does have a family history of congestive heart failure. Unfortunately, patient does not have majority of the risk factors for congestive heart failure. Under the current circumstances, it may be just fluid retention from her medication.  In addition, she is also complaining of left knee joint pain going on for nearly one month. She reports that every day pain is rated 6 on a scale of 0-10. He can flare and he can go as high as a 10. She is asking for cortisone injection if appropriate. She also states that she injured her left knee sometime in January 2018 and she's been having additional problems since then. 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  Note also that patient is working full-time and taking 18 hours  the semester for her degree as well as volunteering. All of these factors are influencing her current symptoms.  Activities of Daily Living:  Patient reports morning stiffness for 30 minutes.   Patient Reports nocturnal pain.  Difficulty dressing/grooming: Denies Difficulty climbing stairs: Reports Difficulty getting out of chair: Reports Difficulty using hands for taps, buttons, cutlery, and/or writing: Reports   Review of Systems  Constitutional: Positive for fatigue.  HENT: Negative for mouth sores and mouth dryness.   Eyes: Negative for dryness.  Respiratory: Negative for shortness of breath.   Gastrointestinal: Negative for constipation and diarrhea.  Musculoskeletal: Positive for myalgias and myalgias.  Skin: Negative for sensitivity to sunlight.  Psychiatric/Behavioral: Positive for sleep disturbance. Negative for decreased concentration.    PMFS History:  Patient Active Problem List   Diagnosis Date Noted  . High risk medications (not anticoagulants) long-term use 07/14/2016  . Psoriatic arthropathy ([place]) 04/17/2016  . Psoriasis 04/17/2016  . ANA positive 04/17/2016  . Other fatigue 04/17/2016  . DJD (degenerative joint disease), cervical 04/17/2016  . Spondylosis of lumbar region without myelopathy or radiculopathy 04/17/2016  . History of diabetes mellitus 04/17/2016  . History of migraine 04/17/2016  . Fatty liver 04/17/2016  . Migraine   . Diabetes ([place])   . Depression   . Fibromyalgia   . Muscle cramps     Past  Medical History:  Diagnosis Date  . Depression   . Diabetes ([place])   . Fibromyalgia   . Migraine   . Muscle cramps     History reviewed. No pertinent family history. Past Surgical History:  Procedure Laterality Date  . ABDOMINAL HYSTERECTOMY    . CESAREAN SECTION    . CHOLECYSTECTOMY    . FACET JOINT INJECTION    . GASTRIC BYPASS    . SI Joint Injection     Social History   Social History Narrative   Patient lives at home alone with her  husband and she works full time disability .    Right handed.   Caffeine one times per day.     Objective: Vital Signs: BP 118/62   Pulse 78   Resp 16   Ht 5' 4"  (1.626 m)   Wt 135 lb (61.2 kg)   BMI 23.17 kg/m    Physical Exam  Constitutional: She is oriented to person, place, and time. She appears well-developed and well-nourished.  HENT:  Head: Normocephalic and atraumatic.  Eyes: EOM are normal. Pupils are equal, round, and reactive to light.  Cardiovascular: Normal rate, regular rhythm and normal heart sounds.  Exam reveals no gallop and no friction rub.   No murmur heard. Pulmonary/Chest: Effort normal and breath sounds normal. She has no wheezes. She has no rales.  Abdominal: Soft. Bowel sounds are normal. She exhibits no distension. There is no tenderness. There is no guarding. No hernia.  Musculoskeletal: Normal range of motion. She exhibits no edema, tenderness or deformity.  Lymphadenopathy:    She has no cervical adenopathy.  Neurological: She is alert and oriented to person, place, and time. Coordination normal.  Skin: Skin is warm and dry. Capillary refill takes less than 2 seconds. No rash noted.  Psychiatric: She has a normal mood and affect. Her behavior is normal.  Nursing note and vitals reviewed.    Musculoskeletal Exam:  Full range of motion of all joints Grip strength is equal and strong bilaterally Fiber myalgia tender points are 18 out of 18 positive  CDAI Exam: CDAI Homunculus Exam:   Joint Counts:  CDAI Tender Joint count: 0 CDAI Swollen Joint count: 0     Investigation: Findings:  Plaquenil eye exam is normal as of February 2017 (per patient ) 06/19/2015 negative TB gold   09/26/2013 Right knee joint pain.  X-rays of bilateral knee joints show mild medial compartment narrowing, mild patellofemoral joint space narrowing, no CPPD.  11/26/2009 .  X-ray of bilateral hands in the office today showed right first MCP narrowing and bilateral  PIP narrowing without any erosions.  Bilateral feet x-rays showed bilateral PIP and DIP narrowing which can be consistent with osteoarthritis or early psoriatic arthritis.  We also obtained a pelvis x-ray since she was having a lot of SI joint discomfort.  It showed normal space but there was questionable erosion in the left SI joint base.  A baseline chest x-ray was also obtained PA and lateral which was within normal limits.     Psoriatic arthritis is not well-controlled as a result of inadequate response to Enbrel  Orders Only on 07/10/2016  Component Date Value Ref Range Status  . WBC 07/10/2016 4.8  3.8 - 10.8 K/uL Final  . RBC 07/10/2016 3.78* 3.80 - 5.10 MIL/uL Final  . Hemoglobin 07/10/2016 12.0  11.7 - 15.5 g/dL Final  . HCT 07/10/2016 35.6  35.0 - 45.0 % Final  . MCV 07/10/2016 94.2  80.0 -  100.0 fL Final  . MCH 07/10/2016 31.7  27.0 - 33.0 pg Final  . MCHC 07/10/2016 33.7  32.0 - 36.0 g/dL Final  . RDW 07/10/2016 13.1  11.0 - 15.0 % Final  . Platelets 07/10/2016 165  140 - 400 K/uL Final  . MPV 07/10/2016 9.9  7.5 - 12.5 fL Final  . Neutro Abs 07/10/2016 1776  1,500 - 7,800 cells/uL Final  . Lymphs Abs 07/10/2016 2352  850 - 3,900 cells/uL Final  . Monocytes Absolute 07/10/2016 528  200 - 950 cells/uL Final  . Eosinophils Absolute 07/10/2016 144  15 - 500 cells/uL Final  . Basophils Absolute 07/10/2016 0  0 - 200 cells/uL Final  . Neutrophils Relative % 07/10/2016 37  % Final  . Lymphocytes Relative 07/10/2016 49  % Final  . Monocytes Relative 07/10/2016 11  % Final  . Eosinophils Relative 07/10/2016 3  % Final  . Basophils Relative 07/10/2016 0  % Final  . Smear Review 07/10/2016 Criteria for review not met   Final  . Sodium 07/10/2016 143  135 - 146 mmol/L Final  . Potassium 07/10/2016 4.0  3.5 - 5.3 mmol/L Final  . Chloride 07/10/2016 106  98 - 110 mmol/L Final  . CO2 07/10/2016 28  20 - 31 mmol/L Final  . Glucose, Bld 07/10/2016 71  65 - 99 mg/dL Final  . BUN  07/10/2016 8  7 - 25 mg/dL Final  . Creat 07/10/2016 0.58  0.50 - 1.10 mg/dL Final  . Total Bilirubin 07/10/2016 0.3  0.2 - 1.2 mg/dL Final  . Alkaline Phosphatase 07/10/2016 98  33 - 115 U/L Final  . AST 07/10/2016 28  10 - 30 U/L Final  . ALT 07/10/2016 30* 6 - 29 U/L Final  . Total Protein 07/10/2016 6.1  6.1 - 8.1 g/dL Final  . Albumin 07/10/2016 3.9  3.6 - 5.1 g/dL Final  . Calcium 07/10/2016 8.4* 8.6 - 10.2 mg/dL Final  . GFR, Est African American 07/10/2016 >89  >=60 mL/min Final  . GFR, Est Non African American 07/10/2016 >89  >=60 mL/min Final  . Interferon Gamma Release Assay 07/10/2016 NEGATIVE  NEGATIVE Final  . Quantiferon Nil Value 07/10/2016 0.03  IU/mL Final  . Mitogen-Nil 07/10/2016 >10.00  IU/mL Final  . Quantiferon Tb Ag Minus Nil Value 07/10/2016 0.04  IU/mL Final   Comment:   The Nil tube value is used to determine if the patient has a preexisting immune response which could cause a false-positive reading on the test. In order for a test to be valid, the Nil tube must have a value of less than or equal to 8.0 IU/mL.   The mitogen control tube is used to assure the patient has a healthy immune status and also serves as a control for correct blood handling and incubation. It is used to detect false-negative readings. The mitogen tube must have a gamma interferon value of greater than or equal to 0.5 IU/mL higher than the value of the Nil tube.   The TB antigen tube is coated with the M. tuberculosis specific antigens. For a test to be considered positive, the TB antigen tube value minus the Nil tube value must be greater than or equal to 0.35 IU/mL.   For additional information, please refer to http://education.[facility].com/faq/QFT (This link is being provided for informational/educational purposes only.)   Orders Only on 03/24/2016  Component Date Value Ref Range Status  . Sodium 03/24/2016 140  135 - 146 mmol/L Final  . Potassium  03/24/2016 4.0  3.5  - 5.3 mmol/L Final  . Chloride 03/24/2016 105  98 - 110 mmol/L Final  . CO2 03/24/2016 27  20 - 31 mmol/L Final  . Glucose, Bld 03/24/2016 96  65 - 99 mg/dL Final  . BUN 03/24/2016 13  7 - 25 mg/dL Final  . Creat 03/24/2016 0.65  0.50 - 1.10 mg/dL Final  . Total Bilirubin 03/24/2016 0.3  0.2 - 1.2 mg/dL Final  . Alkaline Phosphatase 03/24/2016 85  33 - 115 U/L Final  . AST 03/24/2016 34* 10 - 30 U/L Final  . ALT 03/24/2016 43* 6 - 29 U/L Final  . Total Protein 03/24/2016 6.0* 6.1 - 8.1 g/dL Final  . Albumin 03/24/2016 3.9  3.6 - 5.1 g/dL Final  . Calcium 03/24/2016 8.6  8.6 - 10.2 mg/dL Final  . GFR, Est African American 03/24/2016 >89  >=60 mL/min Final  . GFR, Est Non African American 03/24/2016 >89  >=60 mL/min Final  . WBC 03/24/2016 4.6  3.8 - 10.8 K/uL Final  . RBC 03/24/2016 3.87  3.80 - 5.10 MIL/uL Final  . Hemoglobin 03/24/2016 12.0  11.7 - 15.5 g/dL Final  . HCT 03/24/2016 36.2  35.0 - 45.0 % Final  . MCV 03/24/2016 93.5  80.0 - 100.0 fL Final  . MCH 03/24/2016 31.0  27.0 - 33.0 pg Final  . MCHC 03/24/2016 33.1  32.0 - 36.0 g/dL Final  . RDW 03/24/2016 13.1  11.0 - 15.0 % Final  . Platelets 03/24/2016 186  140 - 400 K/uL Final  . MPV 03/24/2016 10.3  7.5 - 12.5 fL Final  . Neutro Abs 03/24/2016 1610  1,500 - 7,800 cells/uL Final  . Lymphs Abs 03/24/2016 2300  850 - 3,900 cells/uL Final  . Monocytes Absolute 03/24/2016 460  200 - 950 cells/uL Final  . Eosinophils Absolute 03/24/2016 230  15 - 500 cells/uL Final  . Basophils Absolute 03/24/2016 0  0 - 200 cells/uL Final  . Neutrophils Relative % 03/24/2016 35  % Final  . Lymphocytes Relative 03/24/2016 50  % Final  . Monocytes Relative 03/24/2016 10  % Final  . Eosinophils Relative 03/24/2016 5  % Final  . Basophils Relative 03/24/2016 0  % Final  . Smear Review 03/24/2016 Criteria for review not met   Final  Orders Only on 02/22/2016  Component Date Value Ref Range Status  . Sodium 02/22/2016 139  135 - 146 mmol/L Final   . Potassium 02/22/2016 3.8  3.5 - 5.3 mmol/L Final  . Chloride 02/22/2016 104  98 - 110 mmol/L Final  . CO2 02/22/2016 26  20 - 31 mmol/L Final  . Glucose, Bld 02/22/2016 132* 65 - 99 mg/dL Final  . BUN 02/22/2016 11  7 - 25 mg/dL Final  . Creat 02/22/2016 0.58  0.50 - 1.10 mg/dL Final  . Total Bilirubin 02/22/2016 0.4  0.2 - 1.2 mg/dL Final  . Alkaline Phosphatase 02/22/2016 84  33 - 115 U/L Final  . AST 02/22/2016 27  10 - 30 U/L Final  . ALT 02/22/2016 51* 6 - 29 U/L Final  . Total Protein 02/22/2016 6.5  6.1 - 8.1 g/dL Final  . Albumin 02/22/2016 4.1  3.6 - 5.1 g/dL Final  . Calcium 02/22/2016 8.6  8.6 - 10.2 mg/dL Final  . GFR, Est African American 02/22/2016 >89  >=60 mL/min Final  . GFR, Est Non African American 02/22/2016 >89  >=60 mL/min Final  . WBC 02/22/2016 5.3  3.8 - 10.8 K/uL  Final  . RBC 02/22/2016 4.02  3.80 - 5.10 MIL/uL Final  . Hemoglobin 02/22/2016 12.6  11.7 - 15.5 g/dL Final  . HCT 02/22/2016 37.1  35.0 - 45.0 % Final  . MCV 02/22/2016 92.3  80.0 - 100.0 fL Final  . MCH 02/22/2016 31.3  27.0 - 33.0 pg Final  . MCHC 02/22/2016 34.0  32.0 - 36.0 g/dL Final  . RDW 02/22/2016 12.7  11.0 - 15.0 % Final  . Platelets 02/22/2016 184  140 - 400 K/uL Final  . MPV 02/22/2016 9.9  7.5 - 12.5 fL Final  . Neutro Abs 02/22/2016 2862  1,500 - 7,800 cells/uL Final  . Lymphs Abs 02/22/2016 1855  850 - 3,900 cells/uL Final  . Monocytes Absolute 02/22/2016 477  200 - 950 cells/uL Final  . Eosinophils Absolute 02/22/2016 106  15 - 500 cells/uL Final  . Basophils Absolute 02/22/2016 0  0 - 200 cells/uL Final  . Neutrophils Relative % 02/22/2016 54  % Final  . Lymphocytes Relative 02/22/2016 35  % Final  . Monocytes Relative 02/22/2016 9  % Final  . Eosinophils Relative 02/22/2016 2  % Final  . Basophils Relative 02/22/2016 0  % Final  . Smear Review 02/22/2016 Criteria for review not met   Final      Imaging: No results found.  Speciality Comments: No specialty comments  available.    Procedures:  No procedures performed Allergies: Duloxetine; Flunisolide; Vilazodone; Latex; and Tape   Assessment / Plan:     Visit Diagnoses: Psoriatic arthropathy ([place]) -  Bilateral ankle joint pain and left wrist joint pain and right first and second MCP pain   Psoriasis  Fibromyalgia  ANA positive  High risk medications (not anticoagulants) long-term use - PLQ- 243m 1 tablet q morning & 1/2 tablet at bedtimeEnbrel-568mML injection, inject 0.98 ML (5012mOTAL)under the skin once a week  Other fatigue  DJD (degenerative joint disease), cervical  Spondylosis of lumbar region without myelopathy or radiculopathy  History of diabetes mellitus  History of migraine  Fatty liver    Plan ==>  1)  Doing well w/ Her psoriatic arthritis and psoriasis. Taking Enbrel 1 pen every week and Plaquenil 20 mg in the morning and 100 mg at night. Generally does well with joint pain stiffness and swelling except has flares with change of weather and increased activity.  #2: High risk prescription. On Enbrel once a week On Plaquenil 200 mg in the morning and 100 at night. We discussed changing it over to 200 mg in the morning and 200 mg at night Monday through Friday only.  #3: Left knee pain. Request a cortisone injection 40 mg of Kenalog mixed with one half mL's 1% lidocaine injected today. Patient tolerated procedure well.  #4: Having swelling to lower legs. Consistent with peripheral edema. Mild currently. History of Lyrica causing pedal edema in the past. We cannot lower her dosage of gabapentin at this time (AKA Horizant) to another dose since 300 mg is the lowest dose. Therefore patient will start taking it every other day. 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 She does not want to make any changes in her medication at this time in case there is adverse effects that she may experience during this very critical time of her education.  She also works full-time. As a result, her lower extremity edema is not too bad and is not bothering her too much and she will lower her Horizant when she  finishes her exams. She will let us know how she is doing with the new dose.  Return to clinic in 4 months area at that time we will check and see how patient is doing with her leg swelling, Horizant., Left knee pain.   Orders: No orders of the defined types were placed in this encounter.  No orders of the defined types were placed in this encounter.   Face-to-face time spent with patient was 30 minutes. 50% of time was spent in counseling and coordination of care.  Follow-Up Instructions: Return in about 4 months (around 11/14/2016) for PsA,Ps,Enbrel,Plq 200 bid x5d, FMS,Fatigue,insomnia,stress, left knee pain,(kenalog),leg swelling (g.   Eliezer Lofts, PA-C  Note - This record has been created using [employer].  Chart creation errors have been sought, but may not always  have been located. Such creation errors do not reflect on  the standard of medical care.

## 2016-07-14 NOTE — Progress Notes (Signed)
WNL

## 2016-07-15 ENCOUNTER — Ambulatory Visit (INDEPENDENT_AMBULATORY_CARE_PROVIDER_SITE_OTHER): Payer: BLUE CROSS/BLUE SHIELD | Admitting: Rheumatology

## 2016-07-15 ENCOUNTER — Encounter: Payer: Self-pay | Admitting: Rheumatology

## 2016-07-15 VITALS — BP 118/62 | HR 78 | Resp 16 | Ht 64.0 in | Wt 135.0 lb

## 2016-07-15 DIAGNOSIS — Z79899 Other long term (current) drug therapy: Secondary | ICD-10-CM | POA: Diagnosis not present

## 2016-07-15 DIAGNOSIS — M797 Fibromyalgia: Secondary | ICD-10-CM

## 2016-07-15 DIAGNOSIS — M47816 Spondylosis without myelopathy or radiculopathy, lumbar region: Secondary | ICD-10-CM

## 2016-07-15 DIAGNOSIS — M25562 Pain in left knee: Secondary | ICD-10-CM | POA: Diagnosis not present

## 2016-07-15 DIAGNOSIS — K760 Fatty (change of) liver, not elsewhere classified: Secondary | ICD-10-CM

## 2016-07-15 DIAGNOSIS — L4050 Arthropathic psoriasis, unspecified: Secondary | ICD-10-CM | POA: Diagnosis not present

## 2016-07-15 DIAGNOSIS — M47812 Spondylosis without myelopathy or radiculopathy, cervical region: Secondary | ICD-10-CM

## 2016-07-15 DIAGNOSIS — L409 Psoriasis, unspecified: Secondary | ICD-10-CM

## 2016-07-15 DIAGNOSIS — R7689 Other specified abnormal immunological findings in serum: Secondary | ICD-10-CM

## 2016-07-15 DIAGNOSIS — M5030 Other cervical disc degeneration, unspecified cervical region: Secondary | ICD-10-CM

## 2016-07-15 DIAGNOSIS — R5383 Other fatigue: Secondary | ICD-10-CM | POA: Diagnosis not present

## 2016-07-15 DIAGNOSIS — R768 Other specified abnormal immunological findings in serum: Secondary | ICD-10-CM

## 2016-07-16 MED ORDER — ETANERCEPT 50 MG/ML ~~LOC~~ SOAJ: SUBCUTANEOUS | 0 refills | Status: DC

## 2016-07-28 ENCOUNTER — Other Ambulatory Visit: Payer: Self-pay | Admitting: Neurology

## 2016-09-01 ENCOUNTER — Other Ambulatory Visit: Payer: Self-pay | Admitting: Neurology

## 2016-09-10 ENCOUNTER — Encounter: Payer: Self-pay | Admitting: Neurology

## 2016-09-10 ENCOUNTER — Ambulatory Visit (INDEPENDENT_AMBULATORY_CARE_PROVIDER_SITE_OTHER): Payer: BLUE CROSS/BLUE SHIELD | Admitting: Neurology

## 2016-09-10 ENCOUNTER — Encounter (INDEPENDENT_AMBULATORY_CARE_PROVIDER_SITE_OTHER): Payer: Self-pay

## 2016-09-10 VITALS — BP 104/67 | HR 83 | Ht 64.0 in | Wt 136.5 lb

## 2016-09-10 DIAGNOSIS — G43909 Migraine, unspecified, not intractable, without status migrainosus: Secondary | ICD-10-CM | POA: Diagnosis not present

## 2016-09-10 DIAGNOSIS — M47816 Spondylosis without myelopathy or radiculopathy, lumbar region: Secondary | ICD-10-CM

## 2016-09-10 DIAGNOSIS — R252 Cramp and spasm: Secondary | ICD-10-CM | POA: Diagnosis not present

## 2016-09-10 MED ORDER — TIZANIDINE HCL 4 MG PO TABS: 8.0000 mg | ORAL_TABLET | Freq: Every day | ORAL | 6 refills | Status: DC

## 2016-09-10 MED ORDER — CYCLOBENZAPRINE HCL 10 MG PO TABS: 10.0000 mg | ORAL_TABLET | Freq: Three times a day (TID) | ORAL | 6 refills | Status: DC | PRN

## 2016-09-10 MED ORDER — ZOLMITRIPTAN 5 MG NA SOLN: 1.0000 | Freq: Every day | NASAL | 11 refills | Status: AC | PRN

## 2016-09-10 MED ORDER — OXCARBAZEPINE 150 MG PO TABS: 150.0000 mg | ORAL_TABLET | Freq: Two times a day (BID) | ORAL | 4 refills | Status: AC

## 2016-09-10 NOTE — Progress Notes (Signed)
[facility]  PATIENT: Georjean Mode DOB: [birth year]   HISTORY OF PRESENT ILLNESS: HISTORY Raiya Stainback is 41 years old right-handed female, referred by her rheumatologist Dr. Estanislado Pandy for evaluation of muscle cramping initial visit was in 2015  She hadpast medical history of psoriasis, psoriatic arthritis, was under the care of Dr. Estanislado Pandy, was treated with Humira also plaquenil 262m bid.  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, around 4 AM, while doing finger stick on a patient, she suddenly noticed right-handed muscle spasm, right thumb forceful adduction with cupping of her right hand, very painful, followed by numbness, lasting for 5 minutes, there was no right arm weakness, no dysarthria, no right leg involvement, she has no loss of consciousness, 5 minutes later, she was able to move her right hand again, but she complains of deep achy pain, numbness, lasting about 2 hours, Few days later, she had sudden onset forceful left ankle plantarflexion, very painful, lasting for 10 minutes, followed by left leg pain, difficulty bearing weight, A week later, she had another episodes of forceful left ankle eversion, lasting 20 minutes, again followed by left leg pain, difficulty walking with her left leg, she has to use a wheelchair, Most recent episode was right ankle plantar flexion, lasting 5-10 minutes, followed by right leg pain, She has chronic midline low back pain, neck pain, but no radiating pain to lower extremity, all upper extremity, she has occasionally bilateral fingertips paresthesia, she carried a diagnosis of interstitial cystitis, difficulty knee she detouring sometimes,  has to do self catheter intermittently since 2014, She is also under pain management for her psoriatic arthritis, diffuse body aching pain, has tried gabapentin, Lyrica in the past, all cause worsening urinary retention,  She is on polypharmacy treatment already, including clonazepam 0.2 5 mg as needed, Flexeril 10 mg 3 times a day, Cymbalta 30 mg every day, hydrocodone as needed, Kadian (morphine) extended release 60 mg in the morning, 30 mg at evening, meloxicam.5 a day,  Laboratory June third 20/15, showed a normal CBC, May 11 20/15, with decreased RBC 3.8 2, decreased hemoglobin 10.9, decreased hematocrit at 33 mild elevated ALT 68  She was treated with titrating dose of Trileptal 150 mg twice a day, reported symptoms improvement in July 2015,  extensive laboratory evaluation in 2015, only abnormality was mild elevated TSH, since then, her thyroid supplement has increased, repeat laboratory 2 weeks later showed normal TSH, otherwise low normal B12 260, rest of the laboratory evaluation was normal, including CPK, ESR, CMP, HIV, RPR, MRI of the brain and without contrast was normal in June 2015  She was able to continue to work as  PCharity fundraiser but continue to complains of intermittent spells of muscle spasm, more on left hand, draws up, despite continued medication treatment, Trileptal, Flexeril, also complains of migraine headaches, taking Zomig as needed,  Last clinical visit was with nurse practitioner CHoyle SauerAugust 2017  She still has recurrent bilateral lower extremity more than upper extremity muscle cramping, especially after prolonged bearing weight, also complains of bilateral lower extremity edema, her migraine is under control with Zomig as needed about 5 times each months,  I was able to personally reviewed MRI of the brain  and cervical spine 20/15 no significant abnormality found,  Electrodiagnostic study in November 2017 showeda significant abnormality, in specific, there is no  evidence of upper extremity neuropathy or cervical radiculopathy.  I reviewed the laboratory in March 2018 and abdomen not entity found,  REVIEW OF SYSTEMS: Full 14 system review of systems performed and notable only for those listed, all others are neg:      ALLERGIES: Allergies  Allergen Reactions  . Duloxetine Swelling  . Flunisolide Nausea Only  . Vilazodone Other (See Comments)    Ask pt for reaction and enter  . Latex Rash    redness  . Tape Rash    HOME MEDICATIONS: Outpatient Medications Prior to Visit  Medication Sig Dispense Refill  . albuterol (PROVENTIL HFA) 108 (90 BASE) MCG/ACT inhaler Inhale 1-2 puffs into the lungs every 6 (six) hours as needed for wheezing or shortness of breath. Take as directed.    Marland Kitchen albuterol (PROVENTIL) (5 MG/ML) 0.5% nebulizer solution Take 2.5 mg by nebulization every 6 (six) hours as needed for wheezing or shortness of breath.    . baclofen (LIORESAL) 10 MG tablet Take 1 tablet (10 mg total) by mouth daily. 30 each 1  . calcipotriene (DOVONOX) 0.005 % cream Apply 1 application topically 2 (two) times daily.     . Calcium Carb-Cholecalciferol (CALCIUM 600 + D PO) Take 1 tablet by mouth daily.    . cetirizine (ZYRTEC) 10 MG tablet Take 10 mg by mouth daily.    . Cholecalciferol (VITAMIN D3) 5000 units TABS Take by mouth.    . cyclobenzaprine (FLEXERIL) 10 MG tablet Take 10 mg by mouth 3 (three) times daily as needed for muscle spasms.    . diclofenac (FLECTOR) 1.3 % PTCH Place 1 patch onto the skin 2 (two) times daily.    . diclofenac sodium (VOLTAREN) 1 % GEL Place onto the skin.    Marland Kitchen diltiazem (CARDIZEM CD) 180 MG 24 hr capsule Take 180 mg by mouth.    . diltiazem (CARDIZEM) 120 MG tablet Take 120 mg by mouth 2 (two) times daily.     . DULoxetine (CYMBALTA) 20 MG capsule Take 20 mg by mouth every other day.     . eletriptan (RELPAX) 40 MG tablet Take 40 mg by mouth every 2 (two) hours as needed for migraine or headache. One tablet by mouth  at onset of headache. May repeat in 2 hours if headache persists or recurs.     Marland Kitchen esomeprazole (NEXIUM) 40 MG capsule Take 40 mg by mouth.    . estradiol (ESTRACE) 0.1 MG/GM vaginal cream Place 1 Applicatorful vaginally 3 (three) times a week.    . etanercept (ENBREL SURECLICK) 50 MG/ML injection INJECT 0.98 ML (50 MG TOTAL) UNDER THE SKIN ONCE A WEEK 11.76 mL 0  . famotidine (PEPCID) 20 MG tablet Take 40 mg by mouth 2 (two) times daily.     . Fe Fum-FePoly-Vit C-Vit B3 (INTEGRA PO) Take 1 tablet by mouth 2 (two) times daily.     . Gabapentin Enacarbil ER (HORIZANT) 300 MG TBCR Take 300 mg by mouth daily. Taking 388m once daily     . hydroxychloroquine (PLAQUENIL) 200 MG tablet TAKE 1 TABLET BY MOUTH EVERY MORNING & 1/2 TABLET AT BEDTIME 135 tablet 1  . levothyroxine (SYNTHROID, LEVOTHROID) 150 MCG tablet Take 150 mcg by mouth daily before breakfast.    . lidocaine (LIDODERM) 5 % Place 1 patch onto the skin daily. Remove & Discard patch within 12 hours  or as directed by MD    . meclizine (ANTIVERT) 25 MG tablet Take 25 mg by mouth 3 (three) times daily as needed for dizziness.    . Meth-Hyo-M Bl-Na Phos-Ph Sal (URIBEL) 118 MG CAPS Take 1 capsule by mouth daily.     . mometasone (ELOCON) 0.1 % cream Apply 1 application topically daily.    . mometasone (NASONEX) 50 MCG/ACT nasal spray Place 2 sprays into the nose daily as needed (allergies).     . mometasone-formoterol (DULERA) 200-5 MCG/ACT AERO Inhale 2 puffs into the lungs 2 (two) times daily.    . montelukast (SINGULAIR) 10 MG tablet Take 10 mg by mouth at bedtime.    Marland Kitchen morphine (KADIAN) 30 MG 24 hr capsule Take 30-60 mg by mouth 2 (two) times daily. Take 76m in am and 35min pm.    . Multiple Vitamin (MULTI-VITAMIN PO) Take 1 tablet by mouth daily.     . Marland Kitchenmeprazole (PRILOSEC) 20 MG capsule Take 40 mg by mouth 2 (two) times daily before a meal.    . ondansetron (ZOFRAN) 4 MG tablet Take 4 mg by mouth every 8 (eight) hours as needed for nausea  or vomiting.    . OXcarbazepine (TRILEPTAL) 150 MG tablet TAKE 1 TABLET BY MOUTH 2 TIMES A DAY 60 tablet 0  . oxyCODONE-acetaminophen (PERCOCET) 10-325 MG per tablet Take 1 tablet by mouth every 6 (six) hours as needed for pain. as needed.    . pentosan polysulfate (ELMIRON) 100 MG capsule Take 100 mg by mouth 2 (two) times daily.    . pimecrolimus (ELIDEL) 1 % cream Apply 1 application topically 2 (two) times daily.     . ranitidine (ZANTAC) 300 MG capsule Take 300 mg by mouth every evening.    . tamsulosin (FLOMAX) 0.4 MG CAPS capsule Take 0.4 mg by mouth every evening.     . Marland KitcheniZANidine (ZANAFLEX) 4 MG tablet Take 4-8 mg by mouth at bedtime.    . Vitamin D, Ergocalciferol, (DRISDOL) 50000 UNITS CAPS capsule Take 50,000 Units by mouth 3 (three) times a week.     . zolmitriptan (ZOMIG) 5 MG nasal solution Place 1 spray into the nose daily as needed for migraine.      No facility-administered medications prior to visit.     PAST MEDICAL HISTORY: Past Medical History:  Diagnosis Date  . Depression   . Diabetes (HCPella  . Fibromyalgia   . Migraine   . Muscle cramps     PAST SURGICAL HISTORY: Past Surgical History:  Procedure Laterality Date  . ABDOMINAL HYSTERECTOMY    . CESAREAN SECTION    . CHOLECYSTECTOMY    . FACET JOINT INJECTION    . GASTRIC BYPASS    . SI Joint Injection      FAMILY HISTORY: No family history on file.  SOCIAL HISTORY: Social History   Social History  . Marital status: Married    Spouse name: N/A  . Number of children: 2  . Years of education: college   Occupational History  .      [employer]    Social History Main Topics  . Smoking status: Never Smoker  . Smokeless tobacco: Never Used  . Alcohol use No  . Drug use: No  . Sexual activity: Not on file   Other Topics Concern  . Not on file   Social History Narrative   Patient lives at home alone with her husband and she works full time disability .  Right handed.   Caffeine one times  per day.     PHYSICAL EXAM  Vitals:   09/10/16 1443  BP: 104/67  Pulse: 83  Weight: 136 lb 8 oz (61.9 kg)  Height: _0  (1.626 m)   Body mass index is 23.43 kg/m.  Generalized: Well developed, in no acute distress  Head: normocephalic and atraumatic,. Oropharynx benign  Neck: Supple, no carotid bruits  Cardiac: Regular rate rhythm, no murmur  Musculoskeletal: No deformity   Neurological examination   Mentation: Alert oriented to time, place, history taking. Attention span and concentration appropriate. Recent and remote memory intact.  Follows all commands speech and language fluent.   Cranial nerve II-XII: .Pupils were equal round reactive to light extraocular movements were full, visual field were full on confrontational test. Facial sensation and strength were normal. hearing was intact to finger rubbing bilaterally. Uvula tongue midline. head turning and shoulder shrug were normal and symmetric.Tongue protrusion into cheek strength was normal. Motor: normal bulk and tone, full strength in the BUE, BLE, except 4 out of 5 weakness right lower extremity distally .Fine finger movements normal, no pronator drift.  Sensory: normal and symmetric to light touch, pinprick vibratory and proprioception on the left, decreased to vibratory and pinprick on the right lower extremity   Coordination: finger-nose-finger, heel-to-shin bilaterally, no dysmetria Reflexes: Brachioradialis 2/2, biceps 2/2, triceps 2/2, patellar 2/2, Achilles 1/1, plantar responses were flexor bilaterally. Gait and Station: Rising up from seated position without assistance, normal stance,  moderate stride, good arm swing, smooth turning, able to perform tiptoe, and heel walking without difficulty. Tandem gait is mildly unsteady. Ambulates with single-point cane  DIAGNOSTIC DATA (LABS, IMAGING, TESTING) -  ASSESSMENT AND PLAN Talise Sligh is a 40y.o.  Intermittent muscle cramping  Extensive evaluations detailed  above including MRI of the brain, cervical spine, EMG nerve conduction study showed no significant abnormality  Flexeril tizanidine as needed  Migraine headaches  Zomig as needed    Marcial Pacas, M.D. Ph.D.  [facility] [place], [place] [address] Phone: [phone] Fax:      [phone]

## 2016-10-13 ENCOUNTER — Other Ambulatory Visit: Payer: Self-pay | Admitting: Rheumatology

## 2016-10-13 NOTE — Telephone Encounter (Signed)
Last Visit: 07/15/16 Next Visit: 11/04/16 Labs:07/10/16 WNL PLQ Eye Exam: 05/2015 WNL. Patient states she has an appointment in July to update.   Okay to refill PLQ?

## 2016-10-13 NOTE — Telephone Encounter (Signed)
60d

## 2016-10-16 ENCOUNTER — Ambulatory Visit (INDEPENDENT_AMBULATORY_CARE_PROVIDER_SITE_OTHER): Payer: BLUE CROSS/BLUE SHIELD | Admitting: Orthopedic Surgery

## 2016-10-21 ENCOUNTER — Other Ambulatory Visit: Payer: Self-pay | Admitting: Rheumatology

## 2016-10-21 NOTE — Telephone Encounter (Signed)
Last Visit: 07/15/16 Next Visit: 11/04/16  Okay to refill per Dr. Corliss Skainseveshwar

## 2016-10-21 NOTE — Telephone Encounter (Signed)
Last Visit: 07/15/16 Next Visit: 11/04/16 Labs:07/10/16 WNL TB Gold: 07/10/16 Neg  Okay to refill 30 day supply Enbrel?

## 2016-10-29 ENCOUNTER — Encounter (INDEPENDENT_AMBULATORY_CARE_PROVIDER_SITE_OTHER): Payer: Self-pay

## 2016-10-29 ENCOUNTER — Ambulatory Visit (INDEPENDENT_AMBULATORY_CARE_PROVIDER_SITE_OTHER): Payer: Self-pay

## 2016-10-29 ENCOUNTER — Ambulatory Visit (INDEPENDENT_AMBULATORY_CARE_PROVIDER_SITE_OTHER): Payer: BLUE CROSS/BLUE SHIELD | Admitting: Orthopedic Surgery

## 2016-10-29 ENCOUNTER — Other Ambulatory Visit: Payer: Self-pay | Admitting: *Deleted

## 2016-10-29 ENCOUNTER — Encounter (INDEPENDENT_AMBULATORY_CARE_PROVIDER_SITE_OTHER): Payer: Self-pay | Admitting: Orthopedic Surgery

## 2016-10-29 VITALS — BP 106/72 | HR 78 | Ht 64.0 in | Wt 127.0 lb

## 2016-10-29 DIAGNOSIS — Z79899 Other long term (current) drug therapy: Secondary | ICD-10-CM

## 2016-10-29 DIAGNOSIS — M25562 Pain in left knee: Secondary | ICD-10-CM

## 2016-10-29 DIAGNOSIS — G8929 Other chronic pain: Secondary | ICD-10-CM

## 2016-10-29 LAB — CBC WITH DIFFERENTIAL/PLATELET
Basophils Absolute: 60 cells/uL (ref 0–200)
Basophils Relative: 1 %
EOS PCT: 6 %
Eosinophils Absolute: 360 cells/uL (ref 15–500)
HCT: 39.5 % (ref 35.0–45.0)
HEMOGLOBIN: 13.2 g/dL (ref 11.7–15.5)
LYMPHS ABS: 2340 {cells}/uL (ref 850–3900)
Lymphocytes Relative: 39 %
MCH: 31.6 pg (ref 27.0–33.0)
MCHC: 33.4 g/dL (ref 32.0–36.0)
MCV: 94.5 fL (ref 80.0–100.0)
MPV: 10.2 fL (ref 7.5–12.5)
Monocytes Absolute: 540 cells/uL (ref 200–950)
Monocytes Relative: 9 %
NEUTROS ABS: 2700 {cells}/uL (ref 1500–7800)
Neutrophils Relative %: 45 %
Platelets: 187 10*3/uL (ref 140–400)
RBC: 4.18 MIL/uL (ref 3.80–5.10)
RDW: 12.9 % (ref 11.0–15.0)
WBC: 6 10*3/uL (ref 3.8–10.8)

## 2016-10-29 NOTE — Progress Notes (Signed)
Office Visit Note   Patient: Michelle [name]           Date of Birth: June 25, 1975           MRN: [id number] Visit Date: 10/29/2016              Requested by: Michelle [name]., MD [address] [place], [place] [address] PCP: Michelle [name]., MD   Assessment & Plan: Visit Diagnoses:  1. Chronic pain of left knee     Plan:  #1: At this time I'd like to proceed with an MRI scan to rule out medial meniscal tear or other internal pathology. She has certainly had conservative treatment and has failed.  Follow-Up Instructions: Return for review of mri.   Orders:  Orders Placed This Encounter  Procedures  . XR Knee Complete 4 Views Left  . MR Knee Left w/o contrast   No orders of the defined types were placed in this encounter.     Procedures: No procedures performed   Clinical Data: No additional findings.   Subjective: Chief Complaint  Patient presents with  . Left Knee - Pain  . Knee Pain    left knee pain 3 weeks. has been using ice, ibuprofen, elevation. continued swelling and medial pain. unable to fully extend left knee..hx of sx and injections on left knee.     Michelle [name] is a pleasant, 41 year old female with a history of systemic lupus and psoriatic arthritis who on 04/30/2015 was seen in [facility] orthopedics A history of left knee pain. She stated at that time that the left knee started hurting when she squatted down to help her 39-year-old grandson get into his sled. When her grandson slipped, she squatted down very quickly catching him. She felt a pop in her left knee as well as a twisting motion. She developed significant pain. She eventually developed swelling the following day. She states that she had an old prescription of steroid taper that she took. She noted that the swelling had gone down significantly but was still having extreme difficulty bearing weight on it and she is walking now with crutches. Pain is at that time was diffuse, sharp, constant  and rated as a 7 out of 10. She denied any buckling. She noted weakness in her left leg and knee. She denied any locking or catching. She was unable to fully extend and bend her knee. No numbness or tingling. She was unable to take OTC NSAIDs because of her other rheumatologic medications.  She apparently had an MRI scheduled but this was never completed.  On 07/15/2016 she was also complaining of left knee joint pain going on for nearly one month to Michelle Copa, PA-C at her appointment for her rheumatologic disease.. She reported that every day pain is rated 6 on a scale of 0-10. She can flare and he can go as high as a 10. 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 Request for cortisone injection was made by her and was given the at that visit.  She returns today requesting another cortisone injection to the knee. Seen today for evaluation.       Review of Systems  All other systems reviewed and are negative.    Objective: Vital Signs: BP 106/72   Pulse 78   Ht 5\' 4"  (1.626 m)   Wt 127 lb (57.6 kg)  BMI 21.80 kg/m   Physical Exam  Constitutional: She is oriented to person, place, and time. She appears well-developed and well-nourished.  HENT:  Head: Normocephalic and atraumatic.  Eyes: EOM are normal. Pupils are equal, round, and reactive to light.  Pulmonary/Chest: Effort normal.  Neurological: She is alert and oriented to person, place, and time.  Skin: Skin is warm and dry.  Psychiatric: She has a normal mood and affect. Her behavior is normal. Judgment and thought content normal.    Ortho Exam  Today she has range of motion from 0 to 110. Minimal effusion at this time. She does have minimal patellofemoral crepitance. Tender to palpation over the medial joint line. McMurray's does cause her pain there also well. Negative Lachman's and  drawer.  Specialty Comments:  No specialty comments available.  Imaging: No results found.   PMFS History: Patient Active Problem List   Diagnosis Date Noted  . High risk medications (not anticoagulants) long-term use 07/14/2016  . Psoriatic arthropathy (HCC) 04/17/2016  . Psoriasis 04/17/2016  . ANA positive 04/17/2016  . Other fatigue 04/17/2016  . DJD (degenerative joint disease), cervical 04/17/2016  . Spondylosis of lumbar region without myelopathy or radiculopathy 04/17/2016  . History of diabetes mellitus 04/17/2016  . History of migraine 04/17/2016  . Fatty liver 04/17/2016  . Migraine   . Diabetes (HCC)   . Depression   . Fibromyalgia   . Muscle cramps    Past Medical History:  Diagnosis Date  . Depression   . Diabetes (HCC)   . Fibromyalgia   . Migraine   . Muscle cramps     No family history on file.  Past Surgical History:  Procedure Laterality Date  . ABDOMINAL HYSTERECTOMY    . CESAREAN SECTION    . CHOLECYSTECTOMY    . FACET JOINT INJECTION    . GASTRIC BYPASS    . SI Joint Injection     Social History   Occupational History  .      [employer]    Social History Main Topics  . Smoking status: Never Smoker  . Smokeless tobacco: Never Used  . Alcohol use No  . Drug use: No  . Sexual activity: Not on file

## 2016-10-30 ENCOUNTER — Telehealth: Payer: Self-pay | Admitting: Radiology

## 2016-10-30 LAB — COMPLETE METABOLIC PANEL WITH GFR
ALBUMIN: 4.3 g/dL (ref 3.6–5.1)
ALK PHOS: 113 U/L (ref 33–115)
ALT: 30 U/L — ABNORMAL HIGH (ref 6–29)
AST: 26 U/L (ref 10–30)
BILIRUBIN TOTAL: 0.4 mg/dL (ref 0.2–1.2)
BUN: 9 mg/dL (ref 7–25)
CO2: 26 mmol/L (ref 20–31)
Calcium: 8.7 mg/dL (ref 8.6–10.2)
Chloride: 104 mmol/L (ref 98–110)
Creat: 0.55 mg/dL (ref 0.50–1.10)
GFR, Est African American: >89 mL/min (ref >=60)
GFR, Est Non African American: >89 mL/min (ref >=60)
GLUCOSE: 72 mg/dL (ref 65–99)
Potassium: 4.8 mmol/L (ref 3.5–5.3)
SODIUM: 140 mmol/L (ref 135–146)
TOTAL PROTEIN: 6.7 g/dL (ref 6.1–8.1)

## 2016-10-30 NOTE — Telephone Encounter (Signed)
-----   Message from Shaili Deveshwar, MD sent at 10/30/2016  8:31 AM EDT ----- Labs are stable 

## 2016-10-30 NOTE — Telephone Encounter (Signed)
I have called patient to advise labs are stable  

## 2016-10-30 NOTE — Progress Notes (Signed)
Labs are stable.

## 2016-11-02 DIAGNOSIS — Z8659 Personal history of other mental and behavioral disorders: Secondary | ICD-10-CM | POA: Insufficient documentation

## 2016-11-02 DIAGNOSIS — Z8639 Personal history of other endocrine, nutritional and metabolic disease: Secondary | ICD-10-CM | POA: Insufficient documentation

## 2016-11-02 DIAGNOSIS — R9431 Abnormal electrocardiogram [ECG] [EKG]: Secondary | ICD-10-CM | POA: Insufficient documentation

## 2016-11-02 DIAGNOSIS — N200 Calculus of kidney: Secondary | ICD-10-CM | POA: Insufficient documentation

## 2016-11-02 DIAGNOSIS — Z8709 Personal history of other diseases of the respiratory system: Secondary | ICD-10-CM | POA: Insufficient documentation

## 2016-11-02 DIAGNOSIS — Z9884 Bariatric surgery status: Secondary | ICD-10-CM | POA: Insufficient documentation

## 2016-11-02 DIAGNOSIS — Z79899 Other long term (current) drug therapy: Secondary | ICD-10-CM | POA: Insufficient documentation

## 2016-11-02 NOTE — Progress Notes (Deleted)
Office Visit Note  Patient: Michelle [name]             Date of Birth: 01/19/1976           MRN: [id number]             PCP: Lester CarolinaMcFadden, John C., MD Referring: Lester CarolinaMcFadden, John C., MD Visit Date: 11/04/2016 Occupation: @GUAROCC @    Subjective:  No chief complaint on file.   History of Present Illness: Michelle [name] is a 41 y.o. female ***   Activities of Daily Living:  Patient reports morning stiffness for *** {minute/hour:19697}.   Patient {ACTIONS;DENIES/REPORTS:21021675::"Denies"} nocturnal pain.  Difficulty dressing/grooming: {ACTIONS;DENIES/REPORTS:21021675::"Denies"} Difficulty climbing stairs: {ACTIONS;DENIES/REPORTS:21021675::"Denies"} Difficulty getting out of chair: {ACTIONS;DENIES/REPORTS:21021675::"Denies"} Difficulty using hands for taps, buttons, cutlery, and/or writing: {ACTIONS;DENIES/REPORTS:21021675::"Denies"}   No Rheumatology ROS completed.   PMFS History:  Patient Active Problem List   Diagnosis Date Noted  . High risk medication use 11/02/2016  . History of depression 11/02/2016  . History of asthma 11/02/2016  . Prolonged QT interval 11/02/2016  . History of gastric bypass 11/02/2016  . History of hypothyroidism 11/02/2016  . History of renal calcinosis 11/02/2016  . Psoriatic arthropathy (HCC) 04/17/2016  . Psoriasis 04/17/2016  . ANA positive 04/17/2016  . Other fatigue 04/17/2016  . DJD (degenerative joint disease), cervical 04/17/2016  . Spondylosis of lumbar region without myelopathy or radiculopathy 04/17/2016  . History of diabetes mellitus 04/17/2016  . History of migraine 04/17/2016  . Fatty liver 04/17/2016  . Diabetes (HCC)   . Depression   . Fibromyalgia   . Muscle cramps     Past Medical History:  Diagnosis Date  . Depression   . Diabetes (HCC)   . Fibromyalgia   . Migraine   . Muscle cramps     No family history on file. Past Surgical History:  Procedure Laterality Date  . ABDOMINAL HYSTERECTOMY    . CESAREAN SECTION      . CHOLECYSTECTOMY    . FACET JOINT INJECTION    . GASTRIC BYPASS    . SI Joint Injection     Social History   Social History Narrative   Patient lives at home alone with her husband and she works full time disability .    Right handed.   Caffeine one times per day.     Objective: Vital Signs: There were no vitals taken for this visit.   Physical Exam   Musculoskeletal Exam: ***  CDAI Exam: No CDAI exam completed.    Investigation: No additional findings.  10/29/2016 CBC normal, CMP ALT 30 07/10/2016 TB gold negative Imaging: No results found.  Speciality Comments: No specialty comments available.    Procedures:  No procedures performed Allergies: Duloxetine; Flunisolide; Vilazodone; Latex; and Tape   Assessment / Plan:     Visit Diagnoses: Psoriatic arthropathy (HCC)  Psoriasis  High risk medication use - Enbrel 50 mg subcutaneous every week, Plaquenil 200 mg by mouth twice a day Monday to Friday  DJD (degenerative joint disease), cervical  Spondylosis of lumbar region without myelopathy or radiculopathy  History of diabetes mellitus  History of migraine  Fibromyalgia  Fatty liver  History of depression  History of asthma  Prolonged QT interval  History of gastric bypass  History of hypothyroidism  History of renal calcinosis    Orders: No orders of the defined types were placed in this encounter.  No orders of the defined types were placed in this encounter.   Face-to-face time spent with patient was *** minutes.  50% of time was spent in counseling and coordination of care.  Follow-Up Instructions: No Follow-up on file.   Bo Merino, MD  Note - This record has been created using Editor, commissioning.  Chart creation errors have been sought, but may not always  have been located. Such creation errors do not reflect on  the standard of medical care.

## 2016-11-03 ENCOUNTER — Ambulatory Visit
Admission: RE | Admit: 2016-11-03 | Discharge: 2016-11-03 | Disposition: A | Discharge status: Home / Self Care | Payer: BLUE CROSS/BLUE SHIELD | Source: Ambulatory Visit | Attending: Orthopedic Surgery | Admitting: Orthopedic Surgery

## 2016-11-03 DIAGNOSIS — G8929 Other chronic pain: Secondary | ICD-10-CM

## 2016-11-03 DIAGNOSIS — M25562 Pain in left knee: Principal | ICD-10-CM

## 2016-11-04 ENCOUNTER — Ambulatory Visit: Payer: BLUE CROSS/BLUE SHIELD | Admitting: Rheumatology

## 2016-11-05 ENCOUNTER — Ambulatory Visit (INDEPENDENT_AMBULATORY_CARE_PROVIDER_SITE_OTHER): Payer: BLUE CROSS/BLUE SHIELD | Admitting: Orthopedic Surgery

## 2016-11-05 ENCOUNTER — Ambulatory Visit (INDEPENDENT_AMBULATORY_CARE_PROVIDER_SITE_OTHER): Payer: Self-pay

## 2016-11-05 ENCOUNTER — Encounter (INDEPENDENT_AMBULATORY_CARE_PROVIDER_SITE_OTHER): Payer: Self-pay | Admitting: Orthopedic Surgery

## 2016-11-05 VITALS — BP 99/58 | HR 75

## 2016-11-05 DIAGNOSIS — M25562 Pain in left knee: Secondary | ICD-10-CM

## 2016-11-05 DIAGNOSIS — M25552 Pain in left hip: Secondary | ICD-10-CM

## 2016-11-05 DIAGNOSIS — G8929 Other chronic pain: Secondary | ICD-10-CM

## 2016-11-05 NOTE — Progress Notes (Signed)
Office Visit Note   Patient: Michelle [name]           Date of Birth: [birth year]           MRN: [id number] Visit Date: 11/05/2016              Requested by: Michelle [name]., MD [address] [place], [place] [address] PCP: Michelle [name]., MD   Assessment & Plan: Visit Diagnoses:  1. Chronic pain of left knee   2. Pain in left hip     Plan:  #1: At this time I believe that certainly this is a rheumatologic Problem up. She'll return to Michelle [name] for her evaluation and possible medication change. #2: Her left hip does not reveal much in the way of significant arthritis. She does have little periarticular spurring. Cannot rule out impingement syndrome. That would require an MRI arthrogram but I am not totally convinced that this is a referred pain.  Follow-Up Instructions: No Follow-up on file.   Orders:  Orders Placed This Encounter  Procedures  . XR HIP UNILAT W OR W/O PELVIS 2-3 VIEWS LEFT   No orders of the defined types were placed in this encounter.     Procedures: No procedures performed   Clinical Data: No additional findings.   Subjective: Chief Complaint  Patient presents with  . Left Knee - Pain  . Knee Pain    MRI review    Michelle [name] is a pleasant, 41 year old female with a history of systemic lupus and psoriatic arthritis who on 04/30/2015 was seen in [facility] orthopedics A history of left knee pain. She stated at that time that the left knee started hurting when she squatted down to help her 39-year-old grandson get into his sled. When her grandson slipped, she squatted down very quickly catching him. She felt a pop in her left knee as well as a twisting motion. She developed significant pain. She eventually developed swelling the following day. She states that she had an old prescription of steroid taper that she took. She noted that the swelling had gone down significantly but was still having extreme difficulty bearing weight on it and she is  walking now with crutches. Pain is at that time was diffuse, sharp, constant and rated as a 7 out of 10. She denied any buckling. She noted weakness in her left leg and knee. She denied any locking or catching. She was unable to fully extend and bend her knee. No numbness or tingling. She was unable to take OTC NSAIDs because of her other rheumatologic medications.  She apparently had an MRI scheduled but this was never completed.  On 07/15/2016 she was also complaining of left knee joint pain going on for nearly one month to Michelle Copa, PA-C at her appointment for her rheumatologic disease.. She reported that every day pain is rated 6 on a scale of 0-10. She can flare and he can go as high as a 10. 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 Request for cortisone injection was made by her and was given the at that visit.  At her last visit I was concerned she may have some type of mechanical problem in the knee and I have chosen to obtain an MRI scan she returns today for review of MRI      Review of Systems  All  other systems reviewed and are negative.    Objective: Vital Signs: BP (!) 99/58 (BP Location: Right Arm, Patient Position: Sitting, Cuff Size: Normal)   Pulse 75   Physical Exam  Constitutional: She is oriented to person, place, and time. She appears well-developed and well-nourished.  HENT:  Head: Normocephalic and atraumatic.  Eyes: Pupils are equal, round, and reactive to light. EOM are normal.  Pulmonary/Chest: Effort normal.  Neurological: She is alert and oriented to person, place, and time.  Skin: Skin is warm and dry.  Psychiatric: She has a normal mood and affect. Her behavior is normal. Judgment and thought content normal.    Ortho Exam  Today she has range of motion from 0 to 110. Minimal effusion at this time. She does have  minimal patellofemoral crepitance. Tender to palpation over the medial joint line. McMurray's does cause her pain there also well. Negative Lachman's and drawer.   Specialty Comments:  No specialty comments available.  Imaging: Mr Knee Left W/o Contrast  Result Date: 11/04/2016 CLINICAL DATA:  Two year history of knee pain. EXAM: MRI OF THE LEFT KNEE WITHOUT CONTRAST TECHNIQUE: Multiplanar, multisequence MR imaging of the knee was performed. No intravenous contrast was administered. COMPARISON:  05/17/2015. FINDINGS: MENISCI Medial meniscus:  Intact Lateral meniscus:  Intact LIGAMENTS Cruciates:  Intact Collaterals:  Intact CARTILAGE Patellofemoral:  Normal Medial:  Minimal degenerative chondrosis. Lateral:  Minimal degenerative chondrosis. Joint:  No joint effusion. Popliteal Fossa:  No popliteal mass or Baker's cyst. Extensor Mechanism: The patella retinacular structures are intact and the quadriceps and patellar tendons are intact. Bones:  No acute bony findings.  Known osteochondral abnormality. Other: Small amount of fluid near the medial gastrocs attachment on the femur which could be mild bursitis. IMPRESSION: 1. Intact ligamentous structures and no acute bony findings. 2. No meniscal tear is and intact articular cartilage. 3. Small amount of fluid near the medial gastroc attachment femur could suggest mild bursitis. 4. No joint effusion or Baker's cyst. Electronically Signed   By: Michelle [name] M.D.   On: 11/04/2016 08:44   Three-view x-ray of the pelvis and left hip reveals some periarticular spurring especially on the oblique at the subcapital area of the femoral head. She does have a large amount of bowel gas noted.   PMFS History: Current Outpatient Prescriptions  Medication Sig Dispense Refill  . albuterol (PROVENTIL HFA) 108 (90 BASE) MCG/ACT inhaler Inhale 1-2 puffs into the lungs every 6 (six) hours as needed for wheezing or shortness of breath. Take as directed.    Marland Kitchen albuterol  (PROVENTIL) (5 MG/ML) 0.5% nebulizer solution Take 2.5 mg by nebulization every 6 (six) hours as needed for wheezing or shortness of breath.    . calcipotriene (DOVONOX) 0.005 % cream Apply 1 application topically 2 (two) times daily.     . Calcium Carb-Cholecalciferol (CALCIUM 600 + D PO) Take 1 tablet by mouth daily.    . cetirizine (ZYRTEC) 10 MG tablet Take 10 mg by mouth daily.    . Cholecalciferol (VITAMIN D3) 5000 units TABS Take by mouth.    . cyclobenzaprine (FLEXERIL) 10 MG tablet Take 1 tablet (10 mg total) by mouth 3 (three) times daily as needed for muscle spasms. 30 tablet 6  . diclofenac (FLECTOR) 1.3 % PTCH Place 1 patch onto the skin 2 (two) times daily.    . diclofenac sodium (VOLTAREN) 1 % GEL Place onto the skin.    Marland Kitchen diltiazem (CARDIZEM CD) 180 MG 24 hr capsule Take  180 mg by mouth.    . diltiazem (CARDIZEM) 120 MG tablet Take 120 mg by mouth 2 (two) times daily.     . DULoxetine (CYMBALTA) 20 MG capsule TAKE 1 CAPSULE BY MOUTH DAILY 30 capsule 0  . eletriptan (RELPAX) 40 MG tablet Take 40 mg by mouth every 2 (two) hours as needed for migraine or headache. One tablet by mouth at onset of headache. May repeat in 2 hours if headache persists or recurs.     Elgie Collard SURECLICK 50 MG/ML injection INJECT 0.98 ML (50 MG) UNDER THE SKIN ONCE A WEEK 11.76 mL 0  . esomeprazole (NEXIUM) 40 MG capsule Take 40 mg by mouth.    . estradiol (ESTRACE) 0.1 MG/GM vaginal cream Place 1 Applicatorful vaginally 3 (three) times a week.    . famotidine (PEPCID) 20 MG tablet Take 40 mg by mouth 2 (two) times daily.     . Fe Fum-FePoly-Vit C-Vit B3 (INTEGRA PO) Take 1 tablet by mouth 2 (two) times daily.     . Gabapentin Enacarbil ER (HORIZANT) 300 MG TBCR Take 300 mg by mouth daily. Taking 300mg  once daily     . hydroxychloroquine (PLAQUENIL) 200 MG tablet TAKE 1 TABLET BY MOUTH EVERY MORNING AND 1/2 AT BEDTIME 90 tablet 0  . levothyroxine (SYNTHROID, LEVOTHROID) 150 MCG tablet Take 150 mcg by mouth  daily before breakfast.    . lidocaine (LIDODERM) 5 % Place 1 patch onto the skin daily. Remove & Discard patch within 12 hours or as directed by MD    . meclizine (ANTIVERT) 25 MG tablet Take 25 mg by mouth 3 (three) times daily as needed for dizziness.    . Meth-Hyo-M Bl-Na Phos-Ph Sal (URIBEL) 118 MG CAPS Take 1 capsule by mouth daily.     . mometasone (ELOCON) 0.1 % cream Apply 1 application topically daily.    . mometasone (NASONEX) 50 MCG/ACT nasal spray Place 2 sprays into the nose daily as needed (allergies).     . mometasone-formoterol (DULERA) 200-5 MCG/ACT AERO Inhale 2 puffs into the lungs 2 (two) times daily.    . montelukast (SINGULAIR) 10 MG tablet Take 10 mg by mouth at bedtime.    Marland Kitchen morphine (KADIAN) 30 MG 24 hr capsule Take 30-60 mg by mouth 2 (two) times daily. Take 60mg  in am and 30mg  in pm.    . Multiple Vitamin (MULTI-VITAMIN PO) Take 1 tablet by mouth daily.     Marland Kitchen omeprazole (PRILOSEC) 20 MG capsule Take 40 mg by mouth 2 (two) times daily before a meal.    . ondansetron (ZOFRAN) 4 MG tablet Take 4 mg by mouth every 8 (eight) hours as needed for nausea or vomiting.    . OXcarbazepine (TRILEPTAL) 150 MG tablet Take 1 tablet (150 mg total) by mouth 2 (two) times daily. 180 tablet 4  . oxyCODONE-acetaminophen (PERCOCET) 10-325 MG per tablet Take 1 tablet by mouth every 6 (six) hours as needed for pain. as needed.    . pentosan polysulfate (ELMIRON) 100 MG capsule Take 100 mg by mouth 2 (two) times daily.    . pimecrolimus (ELIDEL) 1 % cream Apply 1 application topically 2 (two) times daily.     . ranitidine (ZANTAC) 300 MG capsule Take 300 mg by mouth every evening.    . tamsulosin (FLOMAX) 0.4 MG CAPS capsule Take 0.4 mg by mouth every evening.     Marland Kitchen tiZANidine (ZANAFLEX) 4 MG tablet Take 2 tablets (8 mg total) by mouth at bedtime.  60 tablet 6  . Vitamin D, Ergocalciferol, (DRISDOL) 50000 UNITS CAPS capsule Take 50,000 Units by mouth 3 (three) times a week.     . zolmitriptan  (ZOMIG) 5 MG nasal solution Place 1 spray into the nose daily as needed for migraine. 6 Units 11   No current facility-administered medications for this visit.      Patient Active Problem List   Diagnosis Date Noted  . High risk medication use 11/02/2016  . History of depression 11/02/2016  . History of asthma 11/02/2016  . Prolonged QT interval 11/02/2016  . History of gastric bypass 11/02/2016  . History of hypothyroidism 11/02/2016  . History of renal calcinosis 11/02/2016  . Psoriatic arthropathy (HCC) 04/17/2016  . Psoriasis 04/17/2016  . ANA positive 04/17/2016  . Other fatigue 04/17/2016  . DJD (degenerative joint disease), cervical 04/17/2016  . Spondylosis of lumbar region without myelopathy or radiculopathy 04/17/2016  . History of diabetes mellitus 04/17/2016  . History of migraine 04/17/2016  . Fatty liver 04/17/2016  . Diabetes (HCC)   . Depression   . Fibromyalgia   . Muscle cramps    Past Medical History:  Diagnosis Date  . Depression   . Diabetes (HCC)   . Fibromyalgia   . Migraine   . Muscle cramps     No family history on file.  Past Surgical History:  Procedure Laterality Date  . ABDOMINAL HYSTERECTOMY    . CESAREAN SECTION    . CHOLECYSTECTOMY    . FACET JOINT INJECTION    . GASTRIC BYPASS    . SI Joint Injection     Social History   Occupational History  .      [employer]    Social History Main Topics  . Smoking status: Never Smoker  . Smokeless tobacco: Never Used  . Alcohol use No  . Drug use: No  . Sexual activity: Not on file

## 2016-11-24 DIAGNOSIS — N3010 Interstitial cystitis (chronic) without hematuria: Secondary | ICD-10-CM | POA: Insufficient documentation

## 2016-11-24 NOTE — Progress Notes (Signed)
Office Visit Note  Patient: Michelle [name]             Date of Birth: 06/12/1975           MRN: [id number]             PCP: Lester [place]., MD Referring: Lester [place]., MD Visit Date: 11/27/2016 Occupation: @GUAROCC @    Subjective:  Pain in bilateral knees.   History of Present Illness: Michelle [name] is a 41 y.o. female with history of psoriatic arthritis psoriasis and osteoarthritis. She states she's been having pain and discomfort in her bilateral knee joints. Her left knee joint is very painful she has difficulty walking due to left knee joint discomfort. She was recently seen by Arlys John( Dr. Hoy Register PA) who evaluated her left knee joint.The x-ray done by Arlys John showed mild osteoarthritis of the knee joint. He also did MRI of the knee joint which was unremarkable. X-ray of the hip joint showed minimal spurring otherwise unremarkable. Patient reports increase morning stiffness and joint pain. She states she has difficulty making a fist in the morning. She's been having discomfort in her neck, lower back and her ankles.  Activities of Daily Living:  Patient reports morning stiffness for 2  hours.   Patient Reports nocturnal pain.  Difficulty dressing/grooming: Reports Difficulty climbing stairs: Reports Difficulty getting out of chair: Reports Difficulty using hands for taps, buttons, cutlery, and/or writing: Reports   Review of Systems  Constitutional: Positive for fatigue and weight gain. Negative for night sweats, weight loss and weakness.  HENT: Positive for mouth sores and mouth dryness. Negative for trouble swallowing, trouble swallowing and nose dryness.   Eyes: Positive for dryness. Negative for pain, redness and visual disturbance.  Respiratory: Positive for shortness of breath. Negative for cough and difficulty breathing.   Cardiovascular: Negative.  Negative for chest pain, palpitations, hypertension, irregular heartbeat and swelling in legs/feet.    Gastrointestinal: Negative.  Negative for blood in stool, constipation and diarrhea.  Endocrine: Negative.  Negative for increased urination.  Genitourinary: Positive for nocturia. Negative for vaginal dryness.  Musculoskeletal: Positive for arthralgias, joint pain, joint swelling, myalgias, muscle weakness, morning stiffness and myalgias. Negative for muscle tenderness.  Skin: Negative.  Negative for color change, rash, hair loss, skin tightness, ulcers and sensitivity to sunlight.  Allergic/Immunologic: Negative for susceptible to infections.  Neurological: Positive for headaches. Negative for dizziness, memory loss and night sweats.  Hematological: Negative.  Negative for swollen glands.  Psychiatric/Behavioral: Negative for depressed mood and sleep disturbance. The patient is nervous/anxious.     PMFS History:  Patient Active Problem List   Diagnosis Date Noted  . Interstitial cystitis 11/24/2016  . High risk medication use 11/02/2016  . History of depression 11/02/2016  . History of asthma 11/02/2016  . Prolonged QT interval 11/02/2016  . History of gastric bypass 11/02/2016  . History of hypothyroidism 11/02/2016  . History of renal calcinosis 11/02/2016  . Psoriatic arthropathy (HCC) 04/17/2016  . Psoriasis 04/17/2016  . ANA positive 04/17/2016  . Other fatigue 04/17/2016  . DDD (degenerative disc disease), cervical 04/17/2016  . DDD (degenerative disc disease), lumbar 04/17/2016  . History of diabetes mellitus 04/17/2016  . History of migraine 04/17/2016  . Fatty liver 04/17/2016  . Diabetes (HCC)   . Depression   . Fibromyalgia   . Muscle cramps     Past Medical History:  Diagnosis Date  . Depression   . Diabetes (HCC)   . Fibromyalgia   . Migraine   .  Muscle cramps     No family history on file. Past Surgical History:  Procedure Laterality Date  . ABDOMINAL HYSTERECTOMY    . CESAREAN SECTION    . CHOLECYSTECTOMY    . FACET JOINT INJECTION    . GASTRIC  BYPASS    . SI Joint Injection     Social History   Social History Narrative   Patient lives at home alone with her husband and she works full time disability .    Right handed.   Caffeine one times per day.     Objective: Vital Signs: BP 112/65   Pulse 73   Resp 14   Ht 5\' 4"  (1.626 m)   Wt 142 lb (64.4 kg)   BMI 24.37 kg/m    Physical Exam  Constitutional: She is oriented to person, place, and time. She appears well-developed and well-nourished.  HENT:  Head: Normocephalic and atraumatic.  Eyes: Conjunctivae and EOM are normal.  Neck: Normal range of motion.  Cardiovascular: Normal rate, regular rhythm, normal heart sounds and intact distal pulses.   Pulmonary/Chest: Effort normal and breath sounds normal.  Abdominal: Soft. Bowel sounds are normal.  Lymphadenopathy:    She has no cervical adenopathy.  Neurological: She is alert and oriented to person, place, and time.  Skin: Skin is warm and dry. Capillary refill takes less than 2 seconds.  Psychiatric: She has a normal mood and affect. Her behavior is normal.  Nursing note and vitals reviewed.    Musculoskeletal Exam: C-spine and thoracic lumbar spine limited range of motion with discomfort. Shoulder joints have limited range of motion with discomfort. Elbow joints wrist joint MCPs PIPs DIPs with good range of motion with no synovitis. She is painful range of motion of her left hip joint left knee joint without any warmth swelling or effusion. No synovitis was noted over her ankle joints or MTP joints. Fibromyalgia tender points were 12 out of 18 positive.  CDAI Exam: No CDAI exam completed.    Investigation: Findings:  11/27/2015 normal Plaquenil eye exam  07/10/2016 negative TB gold   CBC Latest Ref Rng & Units 10/29/2016 07/10/2016 03/24/2016  WBC 3.8 - 10.8 K/uL 6.0 4.8 4.6  Hemoglobin 11.7 - 15.5 g/dL 16.1 09.6 04.5  Hematocrit 35.0 - 45.0 % 39.5 35.6 36.2  Platelets 140 - 400 K/uL 187 165 186   CMP Latest Ref  Rng & Units 10/29/2016 07/10/2016 03/24/2016  Glucose 65 - 99 mg/dL 72 71 96  BUN 7 - 25 mg/dL 9 8 13   Creatinine 0.50 - 1.10 mg/dL 4.09 8.11 9.14  Sodium 135 - 146 mmol/L 140 143 140  Potassium 3.5 - 5.3 mmol/L 4.8 4.0 4.0  Chloride 98 - 110 mmol/L 104 106 105  CO2 20 - 31 mmol/L 26 28 27   Calcium 8.6 - 10.2 mg/dL 8.7 7.8(G) 8.6  Total Protein 6.1 - 8.1 g/dL 6.7 6.1 6.0(L)  Total Bilirubin 0.2 - 1.2 mg/dL 0.4 0.3 0.3  Alkaline Phos 33 - 115 U/L 113 98 85  AST 10 - 30 U/L 26 28 34(H)  ALT 6 - 29 U/L 30(H) 30(H) 43(H)    Imaging: Mr Knee Left W/o Contrast  Result Date: 11/04/2016 CLINICAL DATA:  Two year history of knee pain. EXAM: MRI OF THE LEFT KNEE WITHOUT CONTRAST TECHNIQUE: Multiplanar, multisequence MR imaging of the knee was performed. No intravenous contrast was administered. COMPARISON:  05/17/2015. FINDINGS: MENISCI Medial meniscus:  Intact Lateral meniscus:  Intact LIGAMENTS Cruciates:  Intact Collaterals:  Intact CARTILAGE Patellofemoral:  Normal Medial:  Minimal degenerative chondrosis. Lateral:  Minimal degenerative chondrosis. Joint:  No joint effusion. Popliteal Fossa:  No popliteal mass or Baker's cyst. Extensor Mechanism: The patella retinacular structures are intact and the quadriceps and patellar tendons are intact. Bones:  No acute bony findings.  Known osteochondral abnormality. Other: Small amount of fluid near the medial gastrocs attachment on the femur which could be mild bursitis. IMPRESSION: 1. Intact ligamentous structures and no acute bony findings. 2. No meniscal tear is and intact articular cartilage. 3. Small amount of fluid near the medial gastroc attachment femur could suggest mild bursitis. 4. No joint effusion or Baker's cyst. Electronically Signed   By: Rudie MeyerP.  Gallerani M.D.   On: 11/04/2016 08:44   Xr Hip Unilat W Or W/o Pelvis 2-3 Views Left  Result Date: 11/05/2016 Three-view x-ray of the pelvis and left hip reveals some periarticular spurring especially on  the oblique at the subcapital area of the femoral head. She does have a large amount of bowel gas noted.   Speciality Comments: No specialty comments available.    Procedures:  No procedures performed Allergies: Duloxetine; Flunisolide; Vilazodone; Latex; and Tape   Assessment / Plan:     Visit Diagnoses: Psoriatic arthropathy (HCC): She has no synovitis on examination today.  Psoriasis: She has no active psoriasis lesions.  Weight gain: Patient states she's not been very active and has gained weight. We had detailed discussion about association of weight gain with the joint pain especially in her knees on lower back. Weight loss diet and exercise was discussed at length.  Knee pain: She complains of bilateral knee joint pain more so in the left than the right knee. I do not appreciate any warmth swelling or effusion in her knee joints. She also had extensive workup by Arlys JohnBrian including x-ray and MRI of her knee joint which was all unremarkable . Her left hip x-ray was also unremarkable. I did discuss with her possible source of pain could be from her lower back. We will refer to physical therapy for her lower back pain and knee joint pain. In my opinion weight loss would be helpful as well.  High risk medication use - Enbrel 50 mg sq qweek and Plaquenil 200 mg po bid M-F. Her labs have been stable.  Fibromyalgia: She continues to have a lot of discomfort second to fibromyalgia.  Other fatigue  DDD (degenerative disc disease), cervical: Chronic pain  DDD (degenerative disc disease), lumbar: Chronic pain she's been seen by pain management and back specialist.  Her other medical problems are listed as follows:  History of asthma  History of depression  History of diabetes mellitus  History of gastric bypass  History of hypothyroidism  History of prolonged Q-T interval  Interstitial cystitis   Orders: Orders Placed This Encounter  Procedures  . Ambulatory referral to  Physical Therapy   No orders of the defined types were placed in this encounter.   Face-to-face time spent with patient was 30 minutes. Greater than 50% of time was spent in counseling and coordination of care.  Follow-Up Instructions: Return in about 5 months (around 04/29/2017) for Psoriatic arthritis DDD.   Pollyann SavoyShaili Timmya Blazier, MD  Note - This record has been created using Animal nutritionistDragon software.  Chart creation errors have been sought, but may not always  have been located. Such creation errors do not reflect on  the standard of medical care.

## 2016-11-25 ENCOUNTER — Other Ambulatory Visit: Payer: Self-pay | Admitting: Rheumatology

## 2016-11-25 NOTE — Telephone Encounter (Signed)
Last Visit: 07/15/16 Next Visit: 11/27/16  Okay to refill per Dr. Corliss Skainseveshwar

## 2016-11-27 ENCOUNTER — Ambulatory Visit (INDEPENDENT_AMBULATORY_CARE_PROVIDER_SITE_OTHER): Payer: BLUE CROSS/BLUE SHIELD | Admitting: Rheumatology

## 2016-11-27 VITALS — BP 112/65 | HR 73 | Resp 14 | Ht 64.0 in | Wt 142.0 lb

## 2016-11-27 DIAGNOSIS — R5383 Other fatigue: Secondary | ICD-10-CM

## 2016-11-27 DIAGNOSIS — Z8639 Personal history of other endocrine, nutritional and metabolic disease: Secondary | ICD-10-CM | POA: Diagnosis not present

## 2016-11-27 DIAGNOSIS — L409 Psoriasis, unspecified: Secondary | ICD-10-CM | POA: Diagnosis not present

## 2016-11-27 DIAGNOSIS — M5030 Other cervical disc degeneration, unspecified cervical region: Secondary | ICD-10-CM | POA: Diagnosis not present

## 2016-11-27 DIAGNOSIS — Z8709 Personal history of other diseases of the respiratory system: Secondary | ICD-10-CM

## 2016-11-27 DIAGNOSIS — Z79899 Other long term (current) drug therapy: Secondary | ICD-10-CM | POA: Diagnosis not present

## 2016-11-27 DIAGNOSIS — R635 Abnormal weight gain: Secondary | ICD-10-CM

## 2016-11-27 DIAGNOSIS — M5136 Other intervertebral disc degeneration, lumbar region: Secondary | ICD-10-CM | POA: Diagnosis not present

## 2016-11-27 DIAGNOSIS — M797 Fibromyalgia: Secondary | ICD-10-CM

## 2016-11-27 DIAGNOSIS — Z87898 Personal history of other specified conditions: Secondary | ICD-10-CM

## 2016-11-27 DIAGNOSIS — L4050 Arthropathic psoriasis, unspecified: Secondary | ICD-10-CM

## 2016-11-27 DIAGNOSIS — R768 Other specified abnormal immunological findings in serum: Secondary | ICD-10-CM

## 2016-11-27 DIAGNOSIS — M25562 Pain in left knee: Secondary | ICD-10-CM

## 2016-11-27 DIAGNOSIS — Z8659 Personal history of other mental and behavioral disorders: Secondary | ICD-10-CM | POA: Diagnosis not present

## 2016-11-27 DIAGNOSIS — N3010 Interstitial cystitis (chronic) without hematuria: Secondary | ICD-10-CM

## 2016-11-27 DIAGNOSIS — G8929 Other chronic pain: Secondary | ICD-10-CM

## 2016-11-27 DIAGNOSIS — Z9884 Bariatric surgery status: Secondary | ICD-10-CM | POA: Diagnosis not present

## 2016-11-27 DIAGNOSIS — M25561 Pain in right knee: Secondary | ICD-10-CM

## 2016-11-27 NOTE — Patient Instructions (Signed)
Standing Labs We placed an order today for your standing lab work.    Please come back and get your standing labs in October and every 3 months  We have open lab Monday through Friday from 8:30-11:30 AM and 1:30-4 PM at the office of Dr. Tenecia Ignasiak.   The office is located at [address], [address], [place], [place] [address] No appointment is necessary.   Labs are drawn by [facility].  You may receive a bill from [facility] for your lab work. If you have any questions regarding directions or hours of operation,  please call [phone].    

## 2016-12-05 ENCOUNTER — Telehealth: Payer: Self-pay | Admitting: Radiology

## 2016-12-05 ENCOUNTER — Other Ambulatory Visit: Payer: Self-pay | Admitting: Rheumatology

## 2016-12-05 NOTE — Telephone Encounter (Signed)
Patients PLQ was refilled today  She is due now for eye exam / call patient to advise this is due now.

## 2016-12-05 NOTE — Telephone Encounter (Signed)
11/27/16 last visit  Next visit .05/01/17  11/27/2015 normal Plaquenil eye exam this is due now  CBC Latest Ref Rng & Units 10/29/2016 07/10/2016 03/24/2016  WBC 3.8 - 10.8 K/uL 6.0 4.8 4.6  Hemoglobin 11.7 - 15.5 g/dL 25.4 27.0 62.3  Hematocrit 35.0 - 45.0 % 39.5 35.6 36.2  Platelets 140 - 400 K/uL 187 165 186   CMP Latest Ref Rng & Units 10/29/2016 07/10/2016 03/24/2016  Glucose 65 - 99 mg/dL 72 71 96  BUN 7 - 25 mg/dL 9 8 13   Creatinine 0.50 - 1.10 mg/dL 7.62 8.31 5.17  Sodium 135 - 146 mmol/L 140 143 140  Potassium 3.5 - 5.3 mmol/L 4.8 4.0 4.0  Chloride 98 - 110 mmol/L 104 106 105  CO2 20 - 31 mmol/L 26 28 27   Calcium 8.6 - 10.2 mg/dL 8.7 6.1(Y) 8.6  Total Protein 6.1 - 8.1 g/dL 6.7 6.1 6.0(L)  Total Bilirubin 0.2 - 1.2 mg/dL 0.4 0.3 0.3  Alkaline Phos 33 - 115 U/L 113 98 85  AST 10 - 30 U/L 26 28 34(H)  ALT 6 - 29 U/L 30(H) 30(H) 43(H)    Ok to refill per Dr Corliss Skains , call pt to remind her eye exam is due now.

## 2016-12-09 NOTE — Telephone Encounter (Signed)
Left message to advise patient she is due for PLQ eye exam.

## 2017-01-12 ENCOUNTER — Other Ambulatory Visit: Payer: Self-pay | Admitting: Rheumatology

## 2017-01-21 ENCOUNTER — Ambulatory Visit: Payer: Medicare Other | Admitting: Physical Therapy

## 2017-01-27 ENCOUNTER — Ambulatory Visit: Payer: BLUE CROSS/BLUE SHIELD | Attending: Rheumatology | Admitting: Physical Therapy

## 2017-01-27 DIAGNOSIS — M25562 Pain in left knee: Secondary | ICD-10-CM | POA: Diagnosis present

## 2017-01-27 DIAGNOSIS — G8929 Other chronic pain: Secondary | ICD-10-CM | POA: Insufficient documentation

## 2017-01-27 DIAGNOSIS — M542 Cervicalgia: Secondary | ICD-10-CM | POA: Diagnosis present

## 2017-01-27 DIAGNOSIS — M545 Low back pain: Secondary | ICD-10-CM | POA: Diagnosis present

## 2017-01-27 DIAGNOSIS — R29898 Other symptoms and signs involving the musculoskeletal system: Secondary | ICD-10-CM

## 2017-01-27 DIAGNOSIS — M25561 Pain in right knee: Secondary | ICD-10-CM | POA: Diagnosis present

## 2017-01-27 NOTE — Patient Instructions (Signed)
Scapular Retraction (Standing)   With arms at sides, pinch shoulder blades together. Repeat _15___ times per set. Do __2__ sets per session.   Axial Extension (Chin Tuck)   Pull chin in and lengthen back of neck. Hold __5__ seconds while counting out loud. Repeat _15___ times. Do __2__ sessions per day.  Flexibility: Upper Trapezius Stretch   Gently grasp right side of head while reaching behind back with other hand. Tilt head away until a gentle stretch is felt. Hold __30__ seconds. Repeat _3___ times per set.
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
## 2017-01-29 ENCOUNTER — Ambulatory Visit: Payer: BLUE CROSS/BLUE SHIELD | Admitting: Physical Therapy

## 2017-02-05 ENCOUNTER — Ambulatory Visit: Payer: BLUE CROSS/BLUE SHIELD

## 2017-02-11 ENCOUNTER — Other Ambulatory Visit: Payer: Self-pay | Admitting: Rheumatology

## 2017-02-11 NOTE — Telephone Encounter (Signed)
Last Visit: 11/27/16 Next Visit: 05/01/17 Labs: 10/29/16 stable TB Gold: 07/10/16 Neg  Left message to remind patient she is due for labs.   Okay to refill  Day supply per Dr. Corliss Skainseveshwar

## 2017-02-12 ENCOUNTER — Ambulatory Visit: Payer: BLUE CROSS/BLUE SHIELD

## 2017-02-20 ENCOUNTER — Other Ambulatory Visit: Payer: Self-pay | Admitting: Rheumatology

## 2017-02-20 NOTE — Telephone Encounter (Signed)
Last Visit: 11/27/16 Next Visit: 05/01/17  Okay to refill per Dr. Corliss Skainseveshwar

## 2017-02-23 ENCOUNTER — Telehealth (INDEPENDENT_AMBULATORY_CARE_PROVIDER_SITE_OTHER): Payer: Self-pay

## 2017-02-23 NOTE — Telephone Encounter (Signed)
FYI-  Patient has the Flu and she is not able to come in to have lab work done.  Cb# [phone].  Thank You.

## 2017-02-26 ENCOUNTER — Telehealth: Payer: Self-pay

## 2017-02-26 ENCOUNTER — Other Ambulatory Visit: Payer: Self-pay | Admitting: *Deleted

## 2017-02-26 DIAGNOSIS — Z79899 Other long term (current) drug therapy: Secondary | ICD-10-CM

## 2017-02-26 LAB — CBC WITH DIFFERENTIAL/PLATELET
BASOS ABS: 23 {cells}/uL (ref 0–200)
BASOS PCT: 0.4 %
EOS ABS: 382 {cells}/uL (ref 15–500)
Eosinophils Relative: 6.7 %
HCT: 36.6 % (ref 35.0–45.0)
HEMOGLOBIN: 12.6 g/dL (ref 11.7–15.5)
Lymphs Abs: 2765 cells/uL (ref 850–3900)
MCH: 31.8 pg (ref 27.0–33.0)
MCHC: 34.4 g/dL (ref 32.0–36.0)
MCV: 92.4 fL (ref 80.0–100.0)
MPV: 10.9 fL (ref 7.5–12.5)
Monocytes Relative: 8.4 %
Neutro Abs: 2052 cells/uL (ref 1500–7800)
Neutrophils Relative %: 36 %
PLATELETS: 176 10*3/uL (ref 140–400)
RBC: 3.96 10*6/uL (ref 3.80–5.10)
RDW: 11.8 % (ref 11.0–15.0)
TOTAL LYMPHOCYTE: 48.5 %
WBC: 5.7 10*3/uL (ref 3.8–10.8)
WBCMIX: 479 {cells}/uL (ref 200–950)

## 2017-02-26 LAB — COMPLETE METABOLIC PANEL WITH GFR
AG Ratio: 1.8 (calc) (ref 1.0–2.5)
ALBUMIN MSPROF: 4.2 g/dL (ref 3.6–5.1)
ALT: 18 U/L (ref 6–29)
AST: 19 U/L (ref 10–30)
Alkaline phosphatase (APISO): 87 U/L (ref 33–115)
BUN: 7 mg/dL (ref 7–25)
CALCIUM: 8.7 mg/dL (ref 8.6–10.2)
CO2: 32 mmol/L (ref 20–32)
CREATININE: 0.54 mg/dL (ref 0.50–1.10)
Chloride: 104 mmol/L (ref 98–110)
GFR, EST AFRICAN AMERICAN: 134 mL/min/{1.73_m2} (ref >=60)
GFR, EST NON AFRICAN AMERICAN: 116 mL/min/{1.73_m2} (ref >=60)
Globulin: 2.3 g/dL (calc) (ref 1.9–3.7)
Glucose, Bld: 86 mg/dL (ref 65–99)
Potassium: 4 mmol/L (ref 3.5–5.3)
Sodium: 141 mmol/L (ref 135–146)
TOTAL PROTEIN: 6.5 g/dL (ref 6.1–8.1)
Total Bilirubin: 0.3 mg/dL (ref 0.2–1.2)

## 2017-02-26 NOTE — Telephone Encounter (Signed)
Lab Orders released,  

## 2017-02-26 NOTE — Telephone Encounter (Signed)
Patient would like for lab orders to be faxed to [facility] at [phone].  Please advise.

## 2017-03-24 ENCOUNTER — Other Ambulatory Visit: Payer: Self-pay | Admitting: Rheumatology

## 2017-03-24 NOTE — Telephone Encounter (Addendum)
Last Visit: 11/27/16 Next Visit: 05/01/17 Labs: 02/26/17 WNL 11/27/2015 normal Plaquenil eye exam   Left message to advise patient we need updated PLQ Eye Exam.   Okay to refill 30 day supply per Dr. Corliss Skainseveshwar

## 2017-04-19 NOTE — Progress Notes (Unsigned)
Office Visit Note  Patient: Michelle [name]             Date of Birth: 12/03/1975           MRN: [id number]             PCP: Lester CarolinaMcFadden, John C., MD Referring: Lester CarolinaMcFadden, John C., MD Visit Date: 05/01/2017 Occupation: @GUAROCC @    Subjective:  No chief complaint on file.   History of Present Illness: Michelle [name] is a 41 y.o. female ***   Activities of Daily Living:  Patient reports morning stiffness for *** {minute/hour:19697}.   Patient {ACTIONS;DENIES/REPORTS:21021675::"Denies"} nocturnal pain.  Difficulty dressing/grooming: {ACTIONS;DENIES/REPORTS:21021675::"Denies"} Difficulty climbing stairs: {ACTIONS;DENIES/REPORTS:21021675::"Denies"} Difficulty getting out of chair: {ACTIONS;DENIES/REPORTS:21021675::"Denies"} Difficulty using hands for taps, buttons, cutlery, and/or writing: {ACTIONS;DENIES/REPORTS:21021675::"Denies"}   No Rheumatology ROS completed.   PMFS History:  Patient Active Problem List   Diagnosis Date Noted  . Interstitial cystitis 11/24/2016  . High risk medication use 11/02/2016  . History of depression 11/02/2016  . History of asthma 11/02/2016  . Prolonged QT interval 11/02/2016  . History of gastric bypass 11/02/2016  . History of hypothyroidism 11/02/2016  . History of renal calcinosis 11/02/2016  . Psoriatic arthropathy ([facility]) 04/17/2016  . Psoriasis 04/17/2016  . ANA positive 04/17/2016  . Other fatigue 04/17/2016  . DDD (degenerative disc disease), cervical 04/17/2016  . DDD (degenerative disc disease), lumbar 04/17/2016  . History of diabetes mellitus 04/17/2016  . History of migraine 04/17/2016  . Fatty liver 04/17/2016  . Diabetes ([facility])   . Depression   . Fibromyalgia   . Muscle cramps     Past Medical History:  Diagnosis Date  . Depression   . Diabetes ([facility])   . Fibromyalgia   . Migraine   . Muscle cramps     No family history on file. Past Surgical History:  Procedure Laterality Date  . ABDOMINAL HYSTERECTOMY    . CESAREAN  SECTION    . CHOLECYSTECTOMY    . FACET JOINT INJECTION    . GASTRIC BYPASS    . SI Joint Injection     Social History   Social History Narrative   Patient lives at home alone with her husband and she works full time disability .    Right handed.   Caffeine one times per day.     Objective: Vital Signs: There were no vitals taken for this visit.   Physical Exam   Musculoskeletal Exam: ***  CDAI Exam: No CDAI exam completed.    Investigation: No additional findings.TB Gold: 07/10/2016 Negative  PLQ eye exam: 11/27/2015 CBC Latest Ref Rng & Units 02/26/2017 10/29/2016 07/10/2016  WBC 3.8 - 10.8 Thousand/uL 5.7 6.0 4.8  Hemoglobin 11.7 - 15.5 g/dL 40.912.6 81.113.2 91.412.0  Hematocrit 35.0 - 45.0 % 36.6 39.5 35.6  Platelets 140 - 400 Thousand/uL 176 187 165   CMP Latest Ref Rng & Units 02/26/2017 10/29/2016 07/10/2016  Glucose 65 - 99 mg/dL 86 72 71  BUN 7 - 25 mg/dL 7 9 8   Creatinine 0.50 - 1.10 mg/dL 7.820.54 9.560.55 2.130.58  Sodium 135 - 146 mmol/L 141 140 143  Potassium 3.5 - 5.3 mmol/L 4.0 4.8 4.0  Chloride 98 - 110 mmol/L 104 104 106  CO2 20 - 32 mmol/L 32 26 28  Calcium 8.6 - 10.2 mg/dL 8.7 8.7 0.8(M8.4(L)  Total Protein 6.1 - 8.1 g/dL 6.5 6.7 6.1  Total Bilirubin 0.2 - 1.2 mg/dL 0.3 0.4 0.3  Alkaline Phos 33 - 115 U/L - 113 98  AST 10 - 30 U/L 19 26 28   ALT 6 - 29 U/L 18 30(H) 30(H)    Imaging: No results found.  Speciality Comments: No specialty comments available.    Procedures:  No procedures performed Allergies: Duloxetine; Flunisolide; Vilazodone; Latex; and Tape   Assessment / Plan:     Visit Diagnoses: No diagnosis found.    Orders: No orders of the defined types were placed in this encounter.  No orders of the defined types were placed in this encounter.   Face-to-face time spent with patient was *** minutes. 50% of time was spent in counseling and coordination of care.  Follow-Up Instructions: No Follow-up on file.   Ellen HenriMarissa C Ala Kratz, CMA  Note - This record  has been created using Animal nutritionistDragon software.  Chart creation errors have been sought, but may not always  have been located. Such creation errors do not reflect on  the standard of medical care.

## 2017-04-23 ENCOUNTER — Other Ambulatory Visit: Payer: Self-pay | Admitting: Rheumatology

## 2017-04-24 NOTE — Telephone Encounter (Signed)
Last Visit: 11/27/16 Next Visit: 05/01/17 Labs: 02/26/17 WNL PLQ Eye Exam: 01/2017 WNL  Okay to refill per Dr. Corliss Skainseveshwar

## 2017-04-30 ENCOUNTER — Other Ambulatory Visit: Payer: Self-pay | Admitting: Rheumatology

## 2017-04-30 NOTE — Telephone Encounter (Addendum)
Last Visit: 11/27/16 Next Visit: 05/01/17 Labs: 02/26/17 WNL TB Gold: 07/10/16 Neg  Okay to refill per Dr. Corliss Skainseveshwar

## 2017-05-01 ENCOUNTER — Ambulatory Visit: Payer: BLUE CROSS/BLUE SHIELD | Admitting: Rheumatology

## 2017-05-04 NOTE — Progress Notes (Deleted)
Office Visit Note  Patient: Michelle [name]             Date of Birth: 1975/06/04           MRN: [id number]             PCP: Lester [place]., MD Referring: Lester Dennison., MD Visit Date: 05/07/2017 Occupation: @GUAROCC @    Subjective:  No chief complaint on file.   History of Present Illness: Michelle [name] is a 42 y.o. female ***   Activities of Daily Living:  Patient reports morning stiffness for *** {minute/hour:19697}.   Patient {ACTIONS;DENIES/REPORTS:21021675::"Denies"} nocturnal pain.  Difficulty dressing/grooming: {ACTIONS;DENIES/REPORTS:21021675::"Denies"} Difficulty climbing stairs: {ACTIONS;DENIES/REPORTS:21021675::"Denies"} Difficulty getting out of chair: {ACTIONS;DENIES/REPORTS:21021675::"Denies"} Difficulty using hands for taps, buttons, cutlery, and/or writing: {ACTIONS;DENIES/REPORTS:21021675::"Denies"}   No Rheumatology ROS completed.   PMFS History:  Patient Active Problem List   Diagnosis Date Noted  . Interstitial cystitis 11/24/2016  . High risk medication use 11/02/2016  . History of depression 11/02/2016  . History of asthma 11/02/2016  . Prolonged QT interval 11/02/2016  . History of gastric bypass 11/02/2016  . History of hypothyroidism 11/02/2016  . History of renal calcinosis 11/02/2016  . Psoriatic arthropathy ([facility]) 04/17/2016  . Psoriasis 04/17/2016  . ANA positive 04/17/2016  . Other fatigue 04/17/2016  . DDD (degenerative disc disease), cervical 04/17/2016  . DDD (degenerative disc disease), lumbar 04/17/2016  . History of diabetes mellitus 04/17/2016  . History of migraine 04/17/2016  . Fatty liver 04/17/2016  . Diabetes ([facility])   . Depression   . Fibromyalgia   . Muscle cramps     Past Medical History:  Diagnosis Date  . Depression   . Diabetes ([facility])   . Fibromyalgia   . Migraine   . Muscle cramps     No family history on file. Past Surgical History:  Procedure Laterality Date  . ABDOMINAL HYSTERECTOMY    . CESAREAN  SECTION    . CHOLECYSTECTOMY    . FACET JOINT INJECTION    . GASTRIC BYPASS    . SI Joint Injection     Social History   Social History Narrative   Patient lives at home alone with her husband and she works full time disability .    Right handed.   Caffeine one times per day.     Objective: Vital Signs: There were no vitals taken for this visit.   Physical Exam   Musculoskeletal Exam: ***  CDAI Exam: No CDAI exam completed.    Investigation: No additional findings.TB Gold: 07/10/2016 Negative  CBC Latest Ref Rng & Units 02/26/2017 10/29/2016 07/10/2016  WBC 3.8 - 10.8 Thousand/uL 5.7 6.0 4.8  Hemoglobin 11.7 - 15.5 g/dL 40.9 81.1 91.4  Hematocrit 35.0 - 45.0 % 36.6 39.5 35.6  Platelets 140 - 400 Thousand/uL 176 187 165   CMP Latest Ref Rng & Units 02/26/2017 10/29/2016 07/10/2016  Glucose 65 - 99 mg/dL 86 72 71  BUN 7 - 25 mg/dL 7 9 8   Creatinine 0.50 - 1.10 mg/dL 7.82 9.56 2.13  Sodium 135 - 146 mmol/L 141 140 143  Potassium 3.5 - 5.3 mmol/L 4.0 4.8 4.0  Chloride 98 - 110 mmol/L 104 104 106  CO2 20 - 32 mmol/L 32 26 28  Calcium 8.6 - 10.2 mg/dL 8.7 8.7 0.8(M)  Total Protein 6.1 - 8.1 g/dL 6.5 6.7 6.1  Total Bilirubin 0.2 - 1.2 mg/dL 0.3 0.4 0.3  Alkaline Phos 33 - 115 U/L - 113 98  AST 10 - 30  U/L 19 26 28   ALT 6 - 29 U/L 18 30(H) 30(H)    Imaging: No results found.  Speciality Comments: No specialty comments available.    Procedures:  No procedures performed Allergies: Duloxetine; Flunisolide; Vilazodone; Latex; and Tape   Assessment / Plan:     Visit Diagnoses: No diagnosis found.    Orders: No orders of the defined types were placed in this encounter.  No orders of the defined types were placed in this encounter.   Face-to-face time spent with patient was *** minutes. 50% of time was spent in counseling and coordination of care.  Follow-Up Instructions: No Follow-up on file.   Ellen HenriMarissa C Shamel [name], CMA  Note - This record has been created using  Animal nutritionistDragon software.  Chart creation errors have been sought, but may not always  have been located. Such creation errors do not reflect on  the standard of medical care.

## 2017-05-07 ENCOUNTER — Ambulatory Visit: Payer: BLUE CROSS/BLUE SHIELD | Admitting: Rheumatology

## 2017-05-12 NOTE — Progress Notes (Signed)
Office Visit Note  Patient: Michelle [name]             Date of Birth: 1976-01-13           MRN: [id number]             PCP: Lester [place]., MD Referring: Lester [place]., MD Visit Date: 05/14/2017 Occupation: @GUAROCC @    Subjective:  Bilateral wrists and ankle pain    History of Present Illness: Michelle [name] is a 42 y.o. female with history of psoriatic arthritis, fibromyaglia, and DDD.  Patient states she has been on Enbrel weekly.  She states the Enbrel seems to be only last 3-4 days.  She is on PLQ 200 mg BID M-F.  She has been having increased pain in bilateral ankles and wrist pain.  She does not need refills.  Her most recent PLQ eye exam was in October 2018 and her next one is February 2019.  She has been having increased joint stiffness.  She states her fibromyalgia has been causing more generalized pain.  She is having trapezius muscle tension and muscle tenderness.  She states that recently she felt a "pop" in her lower back.  She has been having increased insomnia and fatigue.    Activities of Daily Living:  Patient reports morning stiffness for 3-4  hours.   Patient Reports nocturnal pain.  Difficulty dressing/grooming: Denies Difficulty climbing stairs: Denies Difficulty getting out of chair: Reports Difficulty using hands for taps, buttons, cutlery, and/or writing: Reports   Review of Systems  Constitutional: Positive for fatigue. Negative for weakness.  HENT: Positive for mouth sores and mouth dryness. Negative for nose dryness.   Eyes: Positive for dryness. Negative for redness and visual disturbance.  Respiratory: Negative for cough, hemoptysis, shortness of breath and difficulty breathing.   Cardiovascular: Negative for chest pain, palpitations, hypertension, irregular heartbeat and swelling in legs/feet.  Gastrointestinal: Negative for blood in stool, constipation and diarrhea.  Endocrine: Negative for increased urination.  Genitourinary: Positive for  painful urination (Intersitial cysitis).  Musculoskeletal: Negative for arthralgias, joint pain, joint swelling, myalgias, muscle weakness, morning stiffness, muscle tenderness and myalgias.  Skin: Positive for rash. Negative for color change, pallor, hair loss, nodules/bumps, redness, skin tightness, ulcers and sensitivity to sunlight.  Neurological: Positive for headaches (Migraines). Negative for dizziness and numbness.  Hematological: Positive for swollen glands.  Psychiatric/Behavioral: Positive for depressed mood and sleep disturbance. The patient is nervous/anxious.     PMFS History:  Patient Active Problem List   Diagnosis Date Noted  . Interstitial cystitis 11/24/2016  . High risk medication use 11/02/2016  . History of depression 11/02/2016  . History of asthma 11/02/2016  . Prolonged QT interval 11/02/2016  . History of gastric bypass 11/02/2016  . History of hypothyroidism 11/02/2016  . History of renal calcinosis 11/02/2016  . Psoriatic arthropathy (HCC) 04/17/2016  . Psoriasis 04/17/2016  . ANA positive 04/17/2016  . Other fatigue 04/17/2016  . DDD (degenerative disc disease), cervical 04/17/2016  . DDD (degenerative disc disease), lumbar 04/17/2016  . History of diabetes mellitus 04/17/2016  . History of migraine 04/17/2016  . Fatty liver 04/17/2016  . Diabetes (HCC)   . Depression   . Fibromyalgia   . Muscle cramps     Past Medical History:  Diagnosis Date  . Depression   . Diabetes (HCC)   . Fibromyalgia   . Migraine   . Muscle cramps     History reviewed. No pertinent family history. Past Surgical History:  Procedure Laterality Date  . ABDOMINAL HYSTERECTOMY    . CESAREAN SECTION    . CHOLECYSTECTOMY    . FACET JOINT INJECTION    . GASTRIC BYPASS    . SI Joint Injection     Social History   Social History Narrative   Patient lives at home alone with her husband and she works full time disability .    Right handed.   Caffeine one times per day.      Objective: Vital Signs: BP 110/69 (BP Location: Left Arm, Patient Position: Sitting, Cuff Size: Small)   Pulse 85   Resp 12   Ht 5\' 4"  (1.626 m)   Wt 145 lb (65.8 kg)   BMI 24.89 kg/m    Physical Exam  Constitutional: She is oriented to person, place, and time. She appears well-developed and well-nourished.  HENT:  Head: Normocephalic and atraumatic.  Eyes: Conjunctivae and EOM are normal.  Neck: Normal range of motion.  Cardiovascular: Normal rate, regular rhythm, normal heart sounds and intact distal pulses.  Pulmonary/Chest: Effort normal and breath sounds normal.  Abdominal: Soft. Bowel sounds are normal.  Lymphadenopathy:    She has no cervical adenopathy.  Neurological: She is alert and oriented to person, place, and time.  Skin: Skin is warm and dry. Capillary refill takes less than 2 seconds.  Psychiatric: She has a normal mood and affect. Her behavior is normal.  Nursing note and vitals reviewed.    Musculoskeletal Exam: C-spine limited ROM.  She has midline spinal tenderness in the thoracic and lumbar region.  She has trapezius muscle tenderness.  Shoulder joints, elbow joints, wrist joints, MCPs, PIPs, and DIPs good ROM with no synovitis.  She has PIP and DIP synovial thickening.  Hip joints, knee joints, ankle joints, MTPs, PIPs, and DIPs good ROM with no synovitis.  Trochanteric bursitis bilaterally.    CDAI Exam: CDAI Homunculus Exam:   Tenderness:  RUE: wrist LUE: wrist RLE: tibiotalar LLE: tibiotalar  Joint Counts:  CDAI Tender Joint count: 2 CDAI Swollen Joint count: 0  Global Assessments:  Patient Global Assessment: 8   CDAI Calculated Score: 10    Investigation: No additional findings.TB Gold: 07/10/2016 Negative  CBC Latest Ref Rng & Units 02/26/2017 10/29/2016 07/10/2016  WBC 3.8 - 10.8 Thousand/uL 5.7 6.0 4.8  Hemoglobin 11.7 - 15.5 g/dL 40.9 81.1 91.4  Hematocrit 35.0 - 45.0 % 36.6 39.5 35.6  Platelets 140 - 400 Thousand/uL 176 187 165    CMP Latest Ref Rng & Units 02/26/2017 10/29/2016 07/10/2016  Glucose 65 - 99 mg/dL 86 72 71  BUN 7 - 25 mg/dL 7 9 8   Creatinine 0.50 - 1.10 mg/dL 7.82 9.56 2.13  Sodium 135 - 146 mmol/L 141 140 143  Potassium 3.5 - 5.3 mmol/L 4.0 4.8 4.0  Chloride 98 - 110 mmol/L 104 104 106  CO2 20 - 32 mmol/L 32 26 28  Calcium 8.6 - 10.2 mg/dL 8.7 8.7 0.8(M)  Total Protein 6.1 - 8.1 g/dL 6.5 6.7 6.1  Total Bilirubin 0.2 - 1.2 mg/dL 0.3 0.4 0.3  Alkaline Phos 33 - 115 U/L - 113 98  AST 10 - 30 U/L 19 26 28   ALT 6 - 29 U/L 18 30(H) 30(H)    Imaging: Xr Lumbar Spine 2-3 Views  Result Date: 05/14/2017 No significant disc space narrowing was noted. No facet joint arthropathy was noted. No vertebral fractures were noted.   Speciality Comments: No specialty comments available.    Procedures:  Trigger Point  Inj Date/Time: 05/14/2017 1:58 PM Performed by: Pollyann Savoyeveshwar, Keondre Markson, MD Authorized by: Pollyann Savoyeveshwar, Andi Mahaffy, MD   Consent Given by:  Patient Site marked: the procedure site was marked   Timeout: prior to procedure the correct patient, procedure, and site was verified   Indications:  Therapeutic Total # of Trigger Points:  2 Location: neck   Needle Size:  27 G Approach:  Dorsal Medications #1:  0.5 mL lidocaine 1 %; 10 mg triamcinolone acetonide 40 MG/ML Medications #2:  0.5 mL lidocaine 1 %; 10 mg triamcinolone acetonide 40 MG/ML Patient tolerance:  Patient tolerated the procedure well with no immediate complications   Allergies: Duloxetine; Flunisolide; Vilazodone; Latex; and Tape   Assessment / Plan:     Visit Diagnoses: Psoriatic arthropathy (HCC): She has no synovitis or dactylitis on exam.  She has PIP and DIP synovial thickening.  She has tenderness of her bilateral 2nd PIP joints.  She will continue on Enbrel and PLQ 200 mg BID M-F.  CBC and CMP were drawn today.   Psoriasis: She has no active psoriasis.    High risk medication use - Enbrel, PLQ: CBC and CMP were drawn today.     Fibromyalgia: She is having a fibromyalgia flare.  She is having increased muscle tension and muscle tenderness in the trapezius region.  She requested trapezius cortisone trigger point injections. She is seeing pain management.  She is on Cymbalta.     DDD (degenerative disc disease), cervical: Chronic pain. She has limited ROM.    DDD (degenerative disc disease), lumbar - Chronic pain she's been seen by pain management and back specialist.  Lower back pain: Patient has been having lower back pain without radiculopathy she was concerned about vertebral fractures that she has history of osteopenia. The obtain x-ray of her lumbar spine 2 views today which were unremarkable.  Other fatigue: Chronic and related to insomnia.   Trapezius muscle spasm: She has muscle tenderness and muscle tension in the trapezius region.  She requested bilateral trapezius cortisone trigger point injections.  She tolerated the procedure well.    Chronic midline low back pain without sciatica: She is having midline spinal tenderness in the lumbar region.  She would like a x-ray of her lumbar spine.    Other medical conditions are listed as follows:   History of asthma  Interstitial cystitis  History of depression  Prolonged QT interval - H/O   History of diabetes mellitus  History of hypothyroidism  History of gastric bypass    Orders: Orders Placed This Encounter  Procedures  . Trigger Point Inj  . XR Lumbar Spine 2-3 Views  . CBC with Differential/Platelet  . COMPLETE METABOLIC PANEL WITH GFR   No orders of the defined types were placed in this encounter.   Face-to-face time spent with patient was 30 minutes.Greater than 50% of time was spent in counseling and coordination of care.  Follow-Up Instructions: Return in about 5 months (around 10/12/2017) for Psoriatic arthritis, Fibromyalgia.   Pollyann SavoyShaili Jamey Harman, MD  Note - This record has been created using [employer] software.  Chart creation  errors have been sought, but may not always  have been located. Such creation errors do not reflect on  the standard of medical care.

## 2017-05-14 ENCOUNTER — Encounter: Payer: Self-pay | Admitting: Rheumatology

## 2017-05-14 ENCOUNTER — Ambulatory Visit (INDEPENDENT_AMBULATORY_CARE_PROVIDER_SITE_OTHER): Payer: Self-pay

## 2017-05-14 ENCOUNTER — Ambulatory Visit (INDEPENDENT_AMBULATORY_CARE_PROVIDER_SITE_OTHER): Payer: BLUE CROSS/BLUE SHIELD | Admitting: Rheumatology

## 2017-05-14 VITALS — BP 110/69 | HR 85 | Resp 12 | Ht 64.0 in | Wt 145.0 lb

## 2017-05-14 DIAGNOSIS — M5030 Other cervical disc degeneration, unspecified cervical region: Secondary | ICD-10-CM

## 2017-05-14 DIAGNOSIS — R5383 Other fatigue: Secondary | ICD-10-CM

## 2017-05-14 DIAGNOSIS — L409 Psoriasis, unspecified: Secondary | ICD-10-CM | POA: Diagnosis not present

## 2017-05-14 DIAGNOSIS — R9431 Abnormal electrocardiogram [ECG] [EKG]: Secondary | ICD-10-CM

## 2017-05-14 DIAGNOSIS — M5136 Other intervertebral disc degeneration, lumbar region: Secondary | ICD-10-CM | POA: Diagnosis not present

## 2017-05-14 DIAGNOSIS — Z79899 Other long term (current) drug therapy: Secondary | ICD-10-CM | POA: Diagnosis not present

## 2017-05-14 DIAGNOSIS — Z8639 Personal history of other endocrine, nutritional and metabolic disease: Secondary | ICD-10-CM

## 2017-05-14 DIAGNOSIS — M62838 Other muscle spasm: Secondary | ICD-10-CM | POA: Diagnosis not present

## 2017-05-14 DIAGNOSIS — L4050 Arthropathic psoriasis, unspecified: Secondary | ICD-10-CM | POA: Diagnosis not present

## 2017-05-14 DIAGNOSIS — N3010 Interstitial cystitis (chronic) without hematuria: Secondary | ICD-10-CM

## 2017-05-14 DIAGNOSIS — Z8709 Personal history of other diseases of the respiratory system: Secondary | ICD-10-CM

## 2017-05-14 DIAGNOSIS — Z8659 Personal history of other mental and behavioral disorders: Secondary | ICD-10-CM

## 2017-05-14 DIAGNOSIS — M797 Fibromyalgia: Secondary | ICD-10-CM

## 2017-05-14 DIAGNOSIS — Z9884 Bariatric surgery status: Secondary | ICD-10-CM

## 2017-05-14 DIAGNOSIS — M545 Low back pain: Secondary | ICD-10-CM

## 2017-05-14 DIAGNOSIS — G8929 Other chronic pain: Secondary | ICD-10-CM

## 2017-05-14 MED ORDER — TRIAMCINOLONE ACETONIDE 40 MG/ML IJ SUSP
10.0000 mg | INTRAMUSCULAR | Status: AC | PRN
  Administered 2017-05-14: 10 mg via INTRAMUSCULAR

## 2017-05-14 MED ORDER — LIDOCAINE HCL 1 % IJ SOLN
0.5000 mL | INTRAMUSCULAR | Status: AC | PRN
  Administered 2017-05-14: .5 mL

## 2017-05-14 NOTE — Patient Instructions (Signed)
Standing Labs We placed an order today for your standing lab work.    Please come back and get your standing labs in April and every 3 months  We have open lab Monday through Friday from 8:30-11:30 AM and 1:30-4 PM at the office of Dr. Shaili Deveshwar.   The office is located at [address], [address], [place], [place] [address] No appointment is necessary.   Labs are drawn by [facility].  You may receive a bill from [facility] for your lab work. If you have any questions regarding directions or hours of operation,  please call [phone].    

## 2017-05-15 LAB — COMPLETE METABOLIC PANEL WITH GFR
AG RATIO: 2 (calc) (ref 1.0–2.5)
ALBUMIN MSPROF: 4.3 g/dL (ref 3.6–5.1)
ALKALINE PHOSPHATASE (APISO): 98 U/L (ref 33–115)
ALT: 20 U/L (ref 6–29)
AST: 23 U/L (ref 10–30)
BILIRUBIN TOTAL: 0.4 mg/dL (ref 0.2–1.2)
BUN: 9 mg/dL (ref 7–25)
CHLORIDE: 105 mmol/L (ref 98–110)
CO2: 28 mmol/L (ref 20–32)
CREATININE: 0.6 mg/dL (ref 0.50–1.10)
Calcium: 8.7 mg/dL (ref 8.6–10.2)
GFR, Est African American: 129 mL/min/{1.73_m2} (ref >=60)
GFR, Est Non African American: 112 mL/min/{1.73_m2} (ref >=60)
GLOBULIN: 2.2 g/dL (ref 1.9–3.7)
Glucose, Bld: 84 mg/dL (ref 65–99)
POTASSIUM: 3.9 mmol/L (ref 3.5–5.3)
SODIUM: 140 mmol/L (ref 135–146)
Total Protein: 6.5 g/dL (ref 6.1–8.1)

## 2017-05-15 LAB — CBC WITH DIFFERENTIAL/PLATELET
BASOS PCT: 0.3 %
Basophils Absolute: 20 cells/uL (ref 0–200)
EOS ABS: 273 {cells}/uL (ref 15–500)
Eosinophils Relative: 4.2 %
HCT: 36.1 % (ref 35.0–45.0)
HEMOGLOBIN: 12.5 g/dL (ref 11.7–15.5)
Lymphs Abs: 2041 cells/uL (ref 850–3900)
MCH: 31.8 pg (ref 27.0–33.0)
MCHC: 34.6 g/dL (ref 32.0–36.0)
MCV: 91.9 fL (ref 80.0–100.0)
MONOS PCT: 10 %
MPV: 10.9 fL (ref 7.5–12.5)
NEUTROS ABS: 3517 {cells}/uL (ref 1500–7800)
Neutrophils Relative %: 54.1 %
PLATELETS: 156 10*3/uL (ref 140–400)
RBC: 3.93 10*6/uL (ref 3.80–5.10)
RDW: 11.7 % (ref 11.0–15.0)
TOTAL LYMPHOCYTE: 31.4 %
WBC: 6.5 10*3/uL (ref 3.8–10.8)
WBCMIX: 650 {cells}/uL (ref 200–950)

## 2017-05-15 NOTE — Progress Notes (Signed)
All labs are WNL

## 2017-06-19 LAB — HM DIABETES EYE EXAM

## 2017-08-06 ENCOUNTER — Other Ambulatory Visit: Payer: Self-pay | Admitting: Rheumatology

## 2017-08-06 NOTE — Telephone Encounter (Signed)
Last visit: 05/14/17 Next Visit: 10/14/17 Labs: 05/14/17 WNL PLQ Eye Exam: 12/29/16 WNL   Okay to refill per Dr. Corliss Skainseveshwar

## 2017-08-14 ENCOUNTER — Other Ambulatory Visit: Payer: Self-pay | Admitting: Rheumatology

## 2017-08-14 DIAGNOSIS — Z79899 Other long term (current) drug therapy: Secondary | ICD-10-CM

## 2017-08-14 DIAGNOSIS — Z9225 Personal history of immunosupression therapy: Secondary | ICD-10-CM

## 2017-08-14 NOTE — Telephone Encounter (Addendum)
Last visit: 05/14/17 Next Visit: 10/14/17 Labs: 05/14/17 WNL TB Gold: 07/10/16  Patient to update labs today.  Okay to refill per Dr. Corliss Skainseveshwar

## 2017-08-17 ENCOUNTER — Other Ambulatory Visit: Payer: Self-pay

## 2017-08-17 DIAGNOSIS — Z9225 Personal history of immunosupression therapy: Secondary | ICD-10-CM

## 2017-08-17 DIAGNOSIS — Z79899 Other long term (current) drug therapy: Secondary | ICD-10-CM

## 2017-08-17 LAB — CBC WITH DIFFERENTIAL/PLATELET
BASOS PCT: 0.5 %
Basophils Absolute: 30 cells/uL (ref 0–200)
EOS ABS: 283 {cells}/uL (ref 15–500)
Eosinophils Relative: 4.8 %
HCT: 37.6 % (ref 35.0–45.0)
HEMOGLOBIN: 12.8 g/dL (ref 11.7–15.5)
Lymphs Abs: 2531 cells/uL (ref 850–3900)
MCH: 31.9 pg (ref 27.0–33.0)
MCHC: 34 g/dL (ref 32.0–36.0)
MCV: 93.8 fL (ref 80.0–100.0)
MONOS PCT: 10.4 %
MPV: 10.8 fL (ref 7.5–12.5)
NEUTROS ABS: 2443 {cells}/uL (ref 1500–7800)
Neutrophils Relative %: 41.4 %
PLATELETS: 197 10*3/uL (ref 140–400)
RBC: 4.01 10*6/uL (ref 3.80–5.10)
RDW: 11.7 % (ref 11.0–15.0)
TOTAL LYMPHOCYTE: 42.9 %
WBC mixed population: 614 cells/uL (ref 200–950)
WBC: 5.9 10*3/uL (ref 3.8–10.8)

## 2017-08-17 LAB — COMPLETE METABOLIC PANEL WITH GFR
AG RATIO: 1.9 (calc) (ref 1.0–2.5)
ALKALINE PHOSPHATASE (APISO): 99 U/L (ref 33–115)
ALT: 23 U/L (ref 6–29)
AST: 24 U/L (ref 10–30)
Albumin: 4.3 g/dL (ref 3.6–5.1)
BUN: 10 mg/dL (ref 7–25)
CALCIUM: 8.7 mg/dL (ref 8.6–10.2)
CO2: 32 mmol/L (ref 20–32)
CREATININE: 0.61 mg/dL (ref 0.50–1.10)
Chloride: 102 mmol/L (ref 98–110)
GFR, EST NON AFRICAN AMERICAN: 111 mL/min/{1.73_m2} (ref >=60)
GFR, Est African American: 129 mL/min/{1.73_m2} (ref >=60)
GLOBULIN: 2.3 g/dL (ref 1.9–3.7)
Glucose, Bld: 65 mg/dL (ref 65–99)
POTASSIUM: 4.2 mmol/L (ref 3.5–5.3)
SODIUM: 138 mmol/L (ref 135–146)
Total Bilirubin: 0.3 mg/dL (ref 0.2–1.2)
Total Protein: 6.6 g/dL (ref 6.1–8.1)

## 2017-08-19 LAB — QUANTIFERON-TB GOLD PLUS
Mitogen-NIL: 6.88 IU/mL
NIL: 0.04 IU/mL
QuantiFERON-TB Gold Plus: NEGATIVE
TB1-NIL: 0.05 IU/mL
TB2-NIL: 0.04 IU/mL

## 2017-09-28 ENCOUNTER — Other Ambulatory Visit: Payer: Self-pay | Admitting: Neurology

## 2017-10-01 ENCOUNTER — Emergency Department (HOSPITAL_COMMUNITY): Payer: BLUE CROSS/BLUE SHIELD

## 2017-10-01 ENCOUNTER — Encounter (HOSPITAL_COMMUNITY): Payer: Self-pay | Admitting: Emergency Medicine

## 2017-10-01 ENCOUNTER — Other Ambulatory Visit: Payer: Self-pay

## 2017-10-01 ENCOUNTER — Emergency Department (HOSPITAL_COMMUNITY)
Admission: EM | Admit: 2017-10-01 | Discharge: 2017-10-01 | Disposition: A | Discharge status: Home / Self Care | Payer: BLUE CROSS/BLUE SHIELD | Attending: Emergency Medicine | Admitting: Emergency Medicine

## 2017-10-01 DIAGNOSIS — Z79899 Other long term (current) drug therapy: Secondary | ICD-10-CM | POA: Diagnosis not present

## 2017-10-01 DIAGNOSIS — Z91040 Latex allergy status: Secondary | ICD-10-CM | POA: Insufficient documentation

## 2017-10-01 DIAGNOSIS — R42 Dizziness and giddiness: Secondary | ICD-10-CM | POA: Insufficient documentation

## 2017-10-01 DIAGNOSIS — E11649 Type 2 diabetes mellitus with hypoglycemia without coma: Secondary | ICD-10-CM | POA: Diagnosis not present

## 2017-10-01 DIAGNOSIS — E119 Type 2 diabetes mellitus without complications: Secondary | ICD-10-CM | POA: Diagnosis not present

## 2017-10-01 DIAGNOSIS — E162 Hypoglycemia, unspecified: Secondary | ICD-10-CM

## 2017-10-01 DIAGNOSIS — R55 Syncope and collapse: Secondary | ICD-10-CM | POA: Insufficient documentation

## 2017-10-01 LAB — I-STAT BETA HCG BLOOD, ED (MC, WL, AP ONLY): I-stat hCG, quantitative: <5 m[IU]/mL (ref <5)

## 2017-10-01 LAB — CBC
HEMATOCRIT: 39.5 % (ref 36.0–46.0)
Hemoglobin: 13.3 g/dL (ref 12.0–15.0)
MCH: 32.1 pg (ref 26.0–34.0)
MCHC: 33.7 g/dL (ref 30.0–36.0)
MCV: 95.4 fL (ref 78.0–100.0)
PLATELETS: 205 10*3/uL (ref 150–400)
RBC: 4.14 MIL/uL (ref 3.87–5.11)
RDW: 12 % (ref 11.5–15.5)
WBC: 5.8 10*3/uL (ref 4.0–10.5)

## 2017-10-01 LAB — BASIC METABOLIC PANEL
ANION GAP: 7 (ref 5–15)
BUN: 11 mg/dL (ref 6–20)
CHLORIDE: 102 mmol/L (ref 101–111)
CO2: 31 mmol/L (ref 22–32)
Calcium: 8.6 mg/dL — ABNORMAL LOW (ref 8.9–10.3)
Creatinine, Ser: 0.58 mg/dL (ref 0.44–1.00)
GFR calc Af Amer: >60 mL/min (ref >60)
Glucose, Bld: 97 mg/dL (ref 65–99)
Potassium: 4.1 mmol/L (ref 3.5–5.1)
SODIUM: 140 mmol/L (ref 135–145)

## 2017-10-01 LAB — I-STAT TROPONIN, ED: Troponin i, poc: 0 ng/mL (ref 0.00–0.08)

## 2017-10-01 LAB — URINALYSIS, ROUTINE W REFLEX MICROSCOPIC
Bacteria, UA: NONE SEEN
Bilirubin Urine: NEGATIVE
Glucose, UA: NEGATIVE mg/dL
Ketones, ur: NEGATIVE mg/dL
Nitrite: NEGATIVE
PH: 6 (ref 5.0–8.0)
Protein, ur: NEGATIVE mg/dL
SPECIFIC GRAVITY, URINE: 1.002 — AB (ref 1.005–1.030)

## 2017-10-01 LAB — CBG MONITORING, ED: GLUCOSE-CAPILLARY: 103 mg/dL — AB (ref 65–99)

## 2017-10-01 MED ORDER — LORAZEPAM 2 MG/ML IJ SOLN
0.5000 mg | Freq: Once | INTRAMUSCULAR | Status: AC
  Administered 2017-10-01: 0.5 mg via INTRAVENOUS
  Filled 2017-10-01: qty 1

## 2017-10-01 MED ORDER — MECLIZINE HCL 25 MG PO TABS: 25.0000 mg | ORAL_TABLET | Freq: Three times a day (TID) | ORAL | 0 refills | Status: AC | PRN

## 2017-10-01 MED ORDER — IOPAMIDOL (ISOVUE-370) INJECTION 76%
INTRAVENOUS | Status: AC
  Filled 2017-10-01: qty 100

## 2017-10-01 MED ORDER — SODIUM CHLORIDE 0.9 % IV BOLUS
1000.0000 mL | Freq: Once | INTRAVENOUS | Status: AC
  Administered 2017-10-01: 1000 mL via INTRAVENOUS

## 2017-10-01 MED ORDER — MECLIZINE HCL 25 MG PO TABS
25.0000 mg | ORAL_TABLET | Freq: Once | ORAL | Status: AC
  Administered 2017-10-01: 25 mg via ORAL
  Filled 2017-10-01: qty 1

## 2017-10-01 MED ORDER — IOPAMIDOL (ISOVUE-370) INJECTION 76%
100.0000 mL | Freq: Once | INTRAVENOUS | Status: AC | PRN
  Administered 2017-10-01: 100 mL via INTRAVENOUS

## 2017-10-01 NOTE — ED Triage Notes (Signed)
[facility] and experienced syncope during class. Pt had an initial CBG of 63 and was given oral glucose by fire fighters on scene. Pt reports feeling swimmy headed and had minor orthostatic bp drop from 140/90 to 124/70 with EMS.

## 2017-10-01 NOTE — Discharge Instructions (Addendum)
Please make sure you are checking your blood sugar.  Please call your doctor tomorrow for a follow up appointment.  If your symptoms worsen, you develop fevers or have any concerns then please seek additional medical care.    Your CT scans of your head and neck showed that you have good blood flow to your brain.    Today you received medications that may make you sleepy or impair your ability to make decisions.  For the next 24 hours please do not drive, operate heavy machinery, care for a small child with out another adult present, or perform any activities that may cause harm to you or someone else if you were to fall asleep or be impaired.   You are being prescribed a medication which may make you sleepy. Please follow up of listed precautions for at least 24 hours after taking one dose.

## 2017-10-01 NOTE — ED Provider Notes (Signed)
[facility]-EMERGENCY DEPT Provider Note   CSN: [id number] Arrival date & time: 10/01/17  1529     History   Chief Complaint Chief Complaint  Patient presents with  . Loss of Consciousness    HPI Michelle [name] is a 42 y.o. female with a past medical history of fibromyalgia, diabetes, prolonged QT, chronic pain with degenerative disc disease and bulging disks causing weakness in bilateral legs, who presents today for evaluation after a syncopal event.  She was in class when she started feeling like her chest was beating funny, says that she suddenly got very lightheaded, dizzy, and feeling like things were moving.  Patient had a full syncopal episode.  Patient initially had a CBG of 63 and was given oral glucose by firefighters on scene.  She reports that she ate a full lunch and she reports compliance with all of her medications, denies possibility of missing any or accidentally doubling doses.  She was mildly orthostatic with EMS.  She denies any recent trauma.  Reports that she does not feel like her legs are worse than normal, however when she gets up to walk she reports feeling like she is dizzy and off-balance.  She has had vertigo once in the past however reports that this is significantly worse and different.  HPI  Past Medical History:  Diagnosis Date  . Depression   . Diabetes (HCC)   . Fibromyalgia   . Migraine   . Muscle cramps     Patient Active Problem List   Diagnosis Date Noted  . Interstitial cystitis 11/24/2016  . High risk medication use 11/02/2016  . History of depression 11/02/2016  . History of asthma 11/02/2016  . Prolonged QT interval 11/02/2016  . History of gastric bypass 11/02/2016  . History of hypothyroidism 11/02/2016  . History of renal calcinosis 11/02/2016  . Psoriatic arthropathy (HCC) 04/17/2016  . Psoriasis 04/17/2016  . ANA positive 04/17/2016  . Other fatigue 04/17/2016  . DDD (degenerative disc disease), cervical  04/17/2016  . DDD (degenerative disc disease), lumbar 04/17/2016  . History of diabetes mellitus 04/17/2016  . History of migraine 04/17/2016  . Fatty liver 04/17/2016  . Diabetes (HCC)   . Depression   . Fibromyalgia   . Muscle cramps     Past Surgical History:  Procedure Laterality Date  . ABDOMINAL HYSTERECTOMY    . CESAREAN SECTION    . CHOLECYSTECTOMY    . FACET JOINT INJECTION    . GASTRIC BYPASS    . SI Joint Injection       OB History   None      Home Medications    Prior to Admission medications   Medication Sig Start Date End Date Taking? Authorizing Provider  buPROPion (WELLBUTRIN SR) 150 MG 12 hr tablet Take 150 mg by mouth 2 (two) times daily.  09/17/17  Yes [provider]  Calcium Carb-Cholecalciferol (CALCIUM 600 + D PO) Take 1 tablet by mouth daily.   Yes [provider]  cyclobenzaprine (FLEXERIL) 10 MG tablet Take 1 tablet (10 mg total) by mouth 3 (three) times daily as needed for muscle spasms. 09/10/16  Yes Levert Feinstein, MD  diclofenac (FLECTOR) 1.3 % PTCH Place 1 patch onto the skin 2 (two) times daily.   Yes [provider]  diclofenac sodium (VOLTAREN) 1 % GEL Apply 2 g topically 4 (four) times daily as needed (pain).    Yes [provider]  diltiazem (CARDIZEM) 120 MG tablet Take 120 mg  by mouth 2 (two) times daily.    Yes [provider]  DULoxetine (CYMBALTA) 20 MG capsule TAKE 1 CAPSULE BY MOUTH DAILY Patient taking differently: TAKE 1 CAPSULE BY MOUTH QOD 08/06/17  Yes Deveshwar, Janalyn Rouse, MD  escitalopram (LEXAPRO) 5 MG tablet Take 5 mg by mouth at bedtime.  08/20/17  Yes [provider]  esomeprazole (NEXIUM) 40 MG capsule Take 40 mg by mouth every evening.  05/22/16  Yes [provider]  estradiol (ESTRACE) 0.1 MG/GM vaginal cream Place 1 Applicatorful vaginally 3 (three) times a week.   Yes [provider]  famotidine (PEPCID) 20 MG tablet Take 40 mg by mouth 2 (two) times daily.     Yes [provider]  Fe Fum-FePoly-Vit C-Vit B3 (INTEGRA PO) Take 1 tablet by mouth 2 (two) times daily.    Yes [provider]  furosemide (LASIX) 20 MG tablet Take 20 mg by mouth daily as needed for fluid or edema.  09/29/17  Yes [provider]  Gabapentin Enacarbil ER (HORIZANT) 300 MG TBCR Take 300 mg by mouth every evening.    Yes [provider]  hydroxychloroquine (PLAQUENIL) 200 MG tablet TAKE 1 TABLET BY MOUTH EVERY MORNING AND1/2 TABLET AT BEDTIME 08/06/17  Yes Deveshwar, Janalyn Rouse, MD  levothyroxine (SYNTHROID, LEVOTHROID) 150 MCG tablet Take 150 mcg by mouth every evening.    Yes [provider]  lidocaine (LIDODERM) 5 % Place 1 patch onto the skin daily as needed (pain). Remove & Discard patch within 12 hours or as directed by MD    Yes [provider]  Meth-Hyo-M Bl-Na Phos-Ph Sal (URIBEL) 118 MG CAPS Take 1 capsule by mouth daily.  11/01/13  Yes [provider]  mometasone (ELOCON) 0.1 % cream Apply 1 application topically daily.   Yes [provider]  mometasone-formoterol (DULERA) 200-5 MCG/ACT AERO Inhale 2 puffs into the lungs 2 (two) times daily.   Yes [provider]  montelukast (SINGULAIR) 10 MG tablet Take 10 mg by mouth at bedtime.   Yes [provider]  morphine (KADIAN) 30 MG 24 hr capsule Take 30-60 mg by mouth 2 (two) times daily. Take 60mg  in am and 30mg  in pm.   Yes [provider]  Multiple Vitamin (MULTI-VITAMIN PO) Take 1 tablet by mouth daily.    Yes [provider]  OXcarbazepine (TRILEPTAL) 150 MG tablet Take 1 tablet (150 mg total) by mouth 2 (two) times daily. 09/10/16  Yes Levert Feinstein, MD  oxyCODONE-acetaminophen (PERCOCET) 10-325 MG tablet Take 1 tablet by mouth every 6 (six) hours as needed for pain.  11/25/16  Yes [provider]  pentosan polysulfate (ELMIRON) 100 MG capsule Take 100 mg by mouth 2 (two) times daily.   Yes [provider]    tamsulosin (FLOMAX) 0.4 MG CAPS capsule Take 0.4 mg by mouth every evening.    Yes [provider]  tiZANidine (ZANAFLEX) 4 MG tablet Take 2 tabs at bedtime as needed.  Please call [phone] to schedule appt for continued refills. Patient taking differently: Take 8 mg by mouth at bedtime. Please call [phone] to schedule appt for continued refills. 09/28/17  Yes Levert Feinstein, MD  zolmitriptan (ZOMIG) 5 MG nasal solution Place 1 spray into the nose daily as needed for migraine. 09/10/16  Yes Levert Feinstein, MD  albuterol (PROVENTIL HFA) 108 (90 BASE) MCG/ACT inhaler Inhale 1-2 puffs into the lungs every 6 (six) hours as needed for wheezing or shortness of breath. Take as directed.  [provider]  albuterol (PROVENTIL) (5 MG/ML) 0.5% nebulizer solution Take 2.5 mg by nebulization every 6 (six) hours as needed for wheezing or shortness of breath.    [provider]  calcipotriene (DOVONOX) 0.005 % cream Apply 1 application topically 2 (two) times daily as needed (psoriasis).     [provider]  ENBREL SURECLICK 50 MG/ML injection INJECT 1 PEN (50 MG) UNDER THE SKIN ONCE A WEEK 08/14/17   [name], MD  meclizine (ANTIVERT) 25 MG tablet Take 1 tablet (25 mg total) by mouth 3 (three) times daily as needed for dizziness. 10/01/17   Cristina Gong, PA-C  mometasone (NASONEX) 50 MCG/ACT nasal spray Place 2 sprays into the nose daily as needed (allergies).     [provider]  ondansetron (ZOFRAN) 4 MG tablet Take 4 mg by mouth every 8 (eight) hours as needed for nausea or vomiting.    [provider]  pimecrolimus (ELIDEL) 1 % cream Apply 1 application topically 2 (two) times daily as needed (psoriasis).     [provider]  Vitamin D, Ergocalciferol, (DRISDOL) 50000 UNITS CAPS capsule Take 50,000 Units by mouth 2 (two) times a week.  09/29/13   [provider]    Family History No family history on file.  Social  History Social History   Tobacco Use  . Smoking status: Never Smoker  . Smokeless tobacco: Never Used  Substance Use Topics  . Alcohol use: No  . Drug use: No     Allergies   Duloxetine; Flunisolide; Vilazodone; Latex; and Tape   Review of Systems Review of Systems  Constitutional: Negative for chills and fever.  HENT: Negative for congestion and dental problem.   Eyes: Negative for discharge, itching and visual disturbance.  Respiratory: Negative for chest tightness and shortness of breath.   Cardiovascular: Positive for palpitations. Negative for chest pain.  Gastrointestinal: Positive for nausea. Negative for abdominal pain, constipation, diarrhea and vomiting.  Musculoskeletal: Positive for neck pain.  Skin: Negative for rash.  Allergic/Immunologic: Positive for immunocompromised state.  Neurological: Positive for dizziness, syncope, light-headedness and headaches. Negative for tremors, weakness and numbness.  Psychiatric/Behavioral: Negative for confusion.  All other systems reviewed and are negative.    Physical Exam Updated Vital Signs BP 99/65 (BP Location: Left Arm)   Pulse 66   Temp 98.5 F (36.9 C) (Oral)   Resp 12   SpO2 100%   Physical Exam  Constitutional: She appears well-developed and well-nourished. No distress.  HENT:  Head: Normocephalic and atraumatic.  Right Ear: Hearing, tympanic membrane, external ear and ear canal normal.  Left Ear: Hearing, tympanic membrane, external ear and ear canal normal.  Mouth/Throat: Oropharynx is clear and moist.  Eyes: Pupils are equal, round, and reactive to light. Conjunctivae and EOM are normal.  No nystagmus.   Neck: Normal range of motion. Neck supple. No JVD present.  Cardiovascular: Normal rate, regular rhythm, normal heart sounds and intact distal pulses. Exam reveals no friction rub.  No murmur heard. Pulmonary/Chest: Effort normal and breath sounds normal. No respiratory distress.  Abdominal: Soft.  Bowel sounds are normal. She exhibits no distension and no mass. There is no tenderness.  Musculoskeletal: She exhibits no edema.  Lymphadenopathy:    She has no cervical adenopathy.  Neurological: She is alert.  Mental Status:  Alert, oriented, thought content appropriate, able to give a coherent history. Speech fluent without evidence of aphasia. Able to follow 2 step commands without difficulty.  Cranial Nerves:  II:  Peripheral visual fields grossly normal, pupils equal, round, reactive to light III,IV, VI: ptosis not present, extra-ocular motions intact bilaterally  V,VII: smile symmetric, facial light touch sensation equal VIII: hearing grossly normal to voice  X: uvula elevates symmetrically  XI: bilateral shoulder shrug symmetric and strong XII: midline tongue extension without fassiculations Motor:  Normal tone. 5/5 in upper extremities bilaterally including strong and equal grip strength  Decreased strength in bilateral lower extremities.  Patient is able to lift both legs off bed.  Reports consistent with her baseline.  Cerebellar: normal finger-to-nose with bilateral upper extremities CV: distal pulses palpable throughout   Unable to evoke nystagmus with position changes.   Skin: Skin is warm and dry.  Psychiatric: She has a normal mood and affect.  Nursing note and vitals reviewed.    ED Treatments / Results  Labs (all labs ordered are listed, but only abnormal results are displayed) Labs Reviewed  BASIC METABOLIC PANEL - Abnormal; Notable for the following components:      Result Value   Calcium 8.6 (*)    All other components within normal limits  URINALYSIS, ROUTINE W REFLEX MICROSCOPIC - Abnormal; Notable for the following components:   Color, Urine STRAW (*)    Specific Gravity, Urine 1.002 (*)    Hgb urine dipstick SMALL (*)    Leukocytes, UA TRACE (*)    All other components within normal limits  CBG MONITORING, ED - Abnormal; Notable for the following  components:   Glucose-Capillary 103 (*)    All other components within normal limits  CBC  CBG MONITORING, ED  I-STAT BETA HCG BLOOD, ED ([facility], [facility], [facility] ONLY)  I-STAT TROPONIN, ED    EKG None  Radiology Ct Angio Head W Or Wo Contrast  Result Date: 10/01/2017 CLINICAL DATA:  Syncope during class.  Persistent dizziness. EXAM: CT ANGIOGRAPHY HEAD AND NECK TECHNIQUE: Multidetector CT imaging of the head and neck was performed using the standard protocol during bolus administration of intravenous contrast. Multiplanar CT image reconstructions and MIPs were obtained to evaluate the vascular anatomy. Carotid stenosis measurements (when applicable) are obtained utilizing NASCET criteria, using the distal internal carotid diameter as the denominator. CONTRAST:  ISOVUE-370 IOPAMIDOL (ISOVUE-370) INJECTION 76% COMPARISON:  MRI 10/13/2013 FINDINGS: CT HEAD FINDINGS Brain: The brain shows a normal appearance without evidence of malformation, atrophy, old or acute small or large vessel infarction, mass lesion, hemorrhage, hydrocephalus or extra-axial collection. Vascular: No hyperdense vessel. No evidence of atherosclerotic calcification. Skull: Normal.  No traumatic finding.  No focal bone lesion. Sinuses/Orbits: Sinuses are clear. Orbits appear normal. Mastoids are clear. Other: None significant CTA NECK FINDINGS Aortic arch: Normal Right carotid system: Normal Left carotid system: Normal Vertebral arteries: Normal Skeleton: Minimal spondylosis C5-6.  No significant finding. Other neck: No mass or lymphadenopathy. Upper chest: Normal Review of the MIP images confirms the above findings CTA HEAD FINDINGS Anterior circulation: Both internal carotid arteries are widely patent through the skull base and siphon regions. No siphon calcification. The anterior and middle cerebral vessels are normal without proximal stenosis, aneurysm or vascular malformation. Posterior circulation: Both vertebral arteries are widely  patent to the basilar. No basilar stenosis. Posterior circulation branch vessels are normal. Venous sinuses: Normal Anatomic variants: None Delayed phase: No abnormal enhancement Review of the MIP images confirms the above findings IMPRESSION: Normal head CT. Normal CT angiography of the neck and head. Electronically Signed   By: Paulina Fusi M.D.   On: 10/01/2017 18:41  Dg Chest 2 View  Result Date: 10/01/2017 CLINICAL DATA:  Syncope EXAM: CHEST - 2 VIEW COMPARISON:  July 13, 2017 FINDINGS: The heart size and mediastinal contours are within normal limits. Both lungs are clear. The visualized skeletal structures are unremarkable. IMPRESSION: No active cardiopulmonary disease. Electronically Signed   By: Gerome Sam III M.D   On: 10/01/2017 18:00   Ct Angio Neck W Or Wo Contrast  Result Date: 10/01/2017 CLINICAL DATA:  Syncope during class.  Persistent dizziness. EXAM: CT ANGIOGRAPHY HEAD AND NECK TECHNIQUE: Multidetector CT imaging of the head and neck was performed using the standard protocol during bolus administration of intravenous contrast. Multiplanar CT image reconstructions and MIPs were obtained to evaluate the vascular anatomy. Carotid stenosis measurements (when applicable) are obtained utilizing NASCET criteria, using the distal internal carotid diameter as the denominator. CONTRAST:  ISOVUE-370 IOPAMIDOL (ISOVUE-370) INJECTION 76% COMPARISON:  MRI 10/13/2013 FINDINGS: CT HEAD FINDINGS Brain: The brain shows a normal appearance without evidence of malformation, atrophy, old or acute small or large vessel infarction, mass lesion, hemorrhage, hydrocephalus or extra-axial collection. Vascular: No hyperdense vessel. No evidence of atherosclerotic calcification. Skull: Normal.  No traumatic finding.  No focal bone lesion. Sinuses/Orbits: Sinuses are clear. Orbits appear normal. Mastoids are clear. Other: None significant CTA NECK FINDINGS Aortic arch: Normal Right carotid system: Normal  Left carotid system: Normal Vertebral arteries: Normal Skeleton: Minimal spondylosis C5-6.  No significant finding. Other neck: No mass or lymphadenopathy. Upper chest: Normal Review of the MIP images confirms the above findings CTA HEAD FINDINGS Anterior circulation: Both internal carotid arteries are widely patent through the skull base and siphon regions. No siphon calcification. The anterior and middle cerebral vessels are normal without proximal stenosis, aneurysm or vascular malformation. Posterior circulation: Both vertebral arteries are widely patent to the basilar. No basilar stenosis. Posterior circulation branch vessels are normal. Venous sinuses: Normal Anatomic variants: None Delayed phase: No abnormal enhancement Review of the MIP images confirms the above findings IMPRESSION: Normal head CT. Normal CT angiography of the neck and head. Electronically Signed   By: Paulina Fusi M.D.   On: 10/01/2017 18:41    Procedures Procedures (including critical care time)  Medications Ordered in ED Medications  iopamidol (ISOVUE-370) 76 % injection (has no administration in time range)  meclizine (ANTIVERT) tablet 25 mg (25 mg Oral Given 10/01/17 1835)  sodium chloride 0.9 % bolus 1,000 mL (0 mLs Intravenous Stopped 10/01/17 1910)  LORazepam (ATIVAN) injection 0.5 mg (0.5 mg Intravenous Given 10/01/17 1834)  iopamidol (ISOVUE-370) 76 % injection 100 mL (100 mLs Intravenous Contrast Given 10/01/17 1749)     Initial Impression / Assessment and Plan / ED Course  I have reviewed the triage vital signs and the nursing notes.  Pertinent labs & imaging results that were available during my care of the patient were reviewed by me and considered in my medical decision making (see chart for details).  Clinical Course as of Oct 01 2252  Thu Oct 01, 2017  2038 Patient re-evaluated, feeling better.  Will attempt ambulation.    [EH]  2139 Patient reevaluated, she reports all symptoms are resolved, she has  been able to ambulate without difficulty and is requesting discharge home.  Will be discharged.   [EH]    Clinical Course User Index [EH] Cristina Gong, PA-C   Patient presents today for evaluation of loss of consciousness with hypoglycemia that was treated prior to arrival.  She reports she has been eating appropriately.  Initially she was mildly orthostatic, on my evaluation her primary complaint is dizziness and feeling lightheaded.  On exam she has weakness in her lower legs which she reports to be chronic.  Unable to induce nystagmus or worsening with position changes.  Based on this, the severity with a syncopal event, CT a head/neck was obtained showing normal flow without evidence of occlusion or stroke.  Patient was treated with Ativan, IV fluids, and meclizine after which she was able to walk without difficulty and reported resolution in her symptoms.  Patient did have an EKG and initial troponin which were normal.  She declined repeat troponin.  Electrolytes were without significant abnormality.  Patient requested discharge.  She is discharged home with prescription for meclizine, return precautions were discussed along with PCP follow-up and patient states understanding.  Patient discharged home.   Final Clinical Impressions(s) / ED Diagnoses   Final diagnoses:  Syncope, unspecified syncope type  Hypoglycemia  Dizziness    ED Discharge Orders        Ordered    meclizine (ANTIVERT) 25 MG tablet  3 times daily PRN     10/01/17 2151       Cristina GongHammond, Carletha Dawn W, PA-C 10/01/17 2254    Wynetta FinesMessick, Peter C, MD 10/01/17 2320

## 2017-10-01 NOTE — ED Notes (Signed)
Bed: [facility] Expected date:  Expected time:  Means of arrival:  Comments: 

## 2017-10-01 NOTE — ED Notes (Signed)
Bed: WA08 Expected date:  Expected time:  Means of arrival:  Comments: Triage 4 

## 2017-10-06 ENCOUNTER — Other Ambulatory Visit: Payer: Self-pay | Admitting: Neurology

## 2017-10-08 NOTE — Progress Notes (Signed)
Office Visit Note  Patient: Michelle [name]             Date of Birth: 07/22/1975           MRN: [id number]             PCP: Lester CarolinaMcFadden, John C., MD Referring: Lester CarolinaMcFadden, John C., MD Visit Date: 10/14/2017 Occupation: @GUAROCC @    Subjective:  Generalized pain   History of Present Illness: Michelle [name] is a 42 y.o. female with history of psoriatic arthritis, fibromyalgia, and DDD.  Patient is on Enbrel 50 mg subcutaneous injections once weekly and Plaquenil 200 mg 1 tablet every morning and half tablet in the evening M-F.  She is on Cymbalta 20 mg every other day.  She denies missing any doses of Enbrel and Plaquenil.  She continues to have some's patches of psoriasis on her bilateral ankles and torso.  She has been using topical Benadryl cream.  She states that she is having pain in multiple joints including her bilateral ankles bilateral knees and bilateral hands.  She states she notices swelling off and on in her bilateral hands.  That she is also developed trigger fingers in her right second and third digits.  She denies any plantar fasciitis or Achilles tendinitis at this time.  She states she is having tenderness in bilateral SI joints.  She is also been having worsening neck pain and stiffness.  She states that her neck pain wakes her up at night.  She reports that her fibromyalgia has been flaring more frequently as well as her interstitial cystitis.  She feels as though the fatigue and insomnia have been worsening.  She continues to have very interrupted sleep at night and wakes up feeling unrested.  She currently ranks her fibromyalgia pain a 10 out of 10.  She reports she continues to exercise on a regular basis as well as doing stretching exercises.  She is worked on Interior and spatial designerrelaxation and meditation as well.  She is also tried massage therapy as well as heat and ice.    Activities of Daily Living:  Patient reports morning stiffness for 4 hours.   Patient Reports nocturnal pain.  Difficulty  dressing/grooming: Denies Difficulty climbing stairs: Reports Difficulty getting out of chair: Reports Difficulty using hands for taps, buttons, cutlery, and/or writing: Reports   Review of Systems  Constitutional: Positive for fatigue and fever.  HENT: Negative for ear pain, mouth sores, mouth dryness and nose dryness.   Eyes: Negative for pain, visual disturbance and dryness.  Respiratory: Negative for cough, hemoptysis, shortness of breath and difficulty breathing.   Cardiovascular: Positive for swelling in legs/feet. Negative for chest pain, palpitations and hypertension.  Gastrointestinal: Negative for blood in stool, constipation and diarrhea.  Endocrine: Negative for increased urination.  Genitourinary: Negative for painful urination.  Musculoskeletal: Positive for arthralgias, joint pain, joint swelling, myalgias, muscle weakness, morning stiffness and myalgias. Negative for muscle tenderness.  Skin: Positive for rash, hair loss and sensitivity to sunlight. Negative for color change, pallor, nodules/bumps, skin tightness and ulcers.  Allergic/Immunologic: Negative for susceptible to infections.  Neurological: Negative for dizziness, numbness, headaches and weakness.  Hematological: Negative for bruising/bleeding tendency and swollen glands.  Psychiatric/Behavioral: Positive for depressed mood and sleep disturbance. The patient is not nervous/anxious.     PMFS History:  Patient Active Problem List   Diagnosis Date Noted  . Interstitial cystitis 11/24/2016  . High risk medication use 11/02/2016  . History of depression 11/02/2016  . History of asthma 11/02/2016  .  Prolonged QT interval 11/02/2016  . History of gastric bypass 11/02/2016  . History of hypothyroidism 11/02/2016  . History of renal calcinosis 11/02/2016  . Psoriatic arthropathy (HCC) 04/17/2016  . Psoriasis 04/17/2016  . ANA positive 04/17/2016  . Other fatigue 04/17/2016  . DDD (degenerative disc disease),  cervical 04/17/2016  . DDD (degenerative disc disease), lumbar 04/17/2016  . History of diabetes mellitus 04/17/2016  . History of migraine 04/17/2016  . Fatty liver 04/17/2016  . Diabetes (HCC)   . Depression   . Fibromyalgia   . Muscle cramps     Past Medical History:  Diagnosis Date  . Depression   . Diabetes (HCC)   . Fibromyalgia   . Migraine   . Muscle cramps     History reviewed. No pertinent family history. Past Surgical History:  Procedure Laterality Date  . ABDOMINAL HYSTERECTOMY    . CESAREAN SECTION    . CHOLECYSTECTOMY    . FACET JOINT INJECTION    . GASTRIC BYPASS    . SI Joint Injection     Social History   Social History Narrative   Patient lives at home alone with her husband and she works full time disability .    Right handed.   Caffeine one times per day.     Objective: Vital Signs: BP 113/76 (BP Location: Left Arm, Patient Position: Sitting, Cuff Size: Normal)   Pulse 75   Ht 5\' 4"  (1.626 m)   Wt 150 lb (68 kg)   BMI 25.75 kg/m    Physical Exam  Constitutional: She is oriented to person, place, and time. She appears well-developed and well-nourished.  HENT:  Head: Normocephalic and atraumatic.  Eyes: Conjunctivae and EOM are normal.  Neck: Normal range of motion.  Cardiovascular: Normal rate, regular rhythm, normal heart sounds and intact distal pulses.  Pulmonary/Chest: Effort normal and breath sounds normal.  Abdominal: Soft. Bowel sounds are normal.  Lymphadenopathy:    She has no cervical adenopathy.  Neurological: She is alert and oriented to person, place, and time.  Skin: Skin is warm and dry. Capillary refill takes less than 2 seconds.  Psoriasis patches scattered on bilateral ankles as well as torso.  Psychiatric: She has a normal mood and affect. Her behavior is normal.  Nursing note and vitals reviewed.    Musculoskeletal Exam: C-spine limited range of motion with lateral rotation to the left and with extension.  Thoracic  and lumbar spine good range of motion.  Midline spinal tenderness in the lumbar region.  She has tenderness in bilateral SI joints.  She has muscle tension and muscle tenderness in the trapezius muscles bilaterally.  Shoulder joints, elbow joints, wrist joints, MCPs, PIPs, and DIPs good ROM with no synovitis.  Hip joints, knee joints, ankle joints, MTPs, PIPs, DIPs good range of motion no synovitis.  No warmth or effusion of bilateral knee joints.  CDAI Exam: CDAI Homunculus Exam:   Joint Counts:  CDAI Tender Joint count: 0 CDAI Swollen Joint count: 0  Global Assessments:  Patient Global Assessment: 7 Provider Global Assessment: 5  CDAI Calculated Score: 12    Investigation: No additional findings.TB Gold: 08/17/2017 Negative  CBC Latest Ref Rng & Units 10/01/2017 08/17/2017 05/14/2017  WBC 4.0 - 10.5 K/uL 5.8 5.9 6.5  Hemoglobin 12.0 - 15.0 g/dL 16.1 09.6 04.5  Hematocrit 36.0 - 46.0 % 39.5 37.6 36.1  Platelets 150 - 400 K/uL 205 197 156   CMP Latest Ref Rng & Units 10/01/2017 08/17/2017 05/14/2017  Glucose 65 - 99 mg/dL 97 65 84  BUN 6 - 20 mg/dL 11 10 9   Creatinine 0.44 - 1.00 mg/dL 1.61 0.96 0.45  Sodium 135 - 145 mmol/L 140 138 140  Potassium 3.5 - 5.1 mmol/L 4.1 4.2 3.9  Chloride 101 - 111 mmol/L 102 102 105  CO2 22 - 32 mmol/L 31 32 28  Calcium 8.9 - 10.3 mg/dL 4.0(J) 8.7 8.7  Total Protein 6.1 - 8.1 g/dL - 6.6 6.5  Total Bilirubin 0.2 - 1.2 mg/dL - 0.3 0.4  Alkaline Phos 33 - 115 U/L - - -  AST 10 - 30 U/L - 24 23  ALT 6 - 29 U/L - 23 20    Imaging: Ct Angio Head W Or Wo Contrast  Result Date: 10/01/2017 CLINICAL DATA:  Syncope during class.  Persistent dizziness. EXAM: CT ANGIOGRAPHY HEAD AND NECK TECHNIQUE: Multidetector CT imaging of the head and neck was performed using the standard protocol during bolus administration of intravenous contrast. Multiplanar CT image reconstructions and MIPs were obtained to evaluate the vascular anatomy. Carotid stenosis measurements  (when applicable) are obtained utilizing NASCET criteria, using the distal internal carotid diameter as the denominator. CONTRAST:  ISOVUE-370 IOPAMIDOL (ISOVUE-370) INJECTION 76% COMPARISON:  MRI 10/13/2013 FINDINGS: CT HEAD FINDINGS Brain: The brain shows a normal appearance without evidence of malformation, atrophy, old or acute small or large vessel infarction, mass lesion, hemorrhage, hydrocephalus or extra-axial collection. Vascular: No hyperdense vessel. No evidence of atherosclerotic calcification. Skull: Normal.  No traumatic finding.  No focal bone lesion. Sinuses/Orbits: Sinuses are clear. Orbits appear normal. Mastoids are clear. Other: None significant CTA NECK FINDINGS Aortic arch: Normal Right carotid system: Normal Left carotid system: Normal Vertebral arteries: Normal Skeleton: Minimal spondylosis C5-6.  No significant finding. Other neck: No mass or lymphadenopathy. Upper chest: Normal Review of the MIP images confirms the above findings CTA HEAD FINDINGS Anterior circulation: Both internal carotid arteries are widely patent through the skull base and siphon regions. No siphon calcification. The anterior and middle cerebral vessels are normal without proximal stenosis, aneurysm or vascular malformation. Posterior circulation: Both vertebral arteries are widely patent to the basilar. No basilar stenosis. Posterior circulation branch vessels are normal. Venous sinuses: Normal Anatomic variants: None Delayed phase: No abnormal enhancement Review of the MIP images confirms the above findings IMPRESSION: Normal head CT. Normal CT angiography of the neck and head. Electronically Signed   By: Paulina Fusi M.D.   On: 10/01/2017 18:41   Dg Chest 2 View  Result Date: 10/01/2017 CLINICAL DATA:  Syncope EXAM: CHEST - 2 VIEW COMPARISON:  July 13, 2017 FINDINGS: The heart size and mediastinal contours are within normal limits. Both lungs are clear. The visualized skeletal structures are unremarkable.  IMPRESSION: No active cardiopulmonary disease. Electronically Signed   By: Gerome Sam III M.D   On: 10/01/2017 18:00   Ct Angio Neck W Or Wo Contrast  Result Date: 10/01/2017 CLINICAL DATA:  Syncope during class.  Persistent dizziness. EXAM: CT ANGIOGRAPHY HEAD AND NECK TECHNIQUE: Multidetector CT imaging of the head and neck was performed using the standard protocol during bolus administration of intravenous contrast. Multiplanar CT image reconstructions and MIPs were obtained to evaluate the vascular anatomy. Carotid stenosis measurements (when applicable) are obtained utilizing NASCET criteria, using the distal internal carotid diameter as the denominator. CONTRAST:  ISOVUE-370 IOPAMIDOL (ISOVUE-370) INJECTION 76% COMPARISON:  MRI 10/13/2013 FINDINGS: CT HEAD FINDINGS Brain: The brain shows a normal appearance without evidence of malformation, atrophy,  old or acute small or large vessel infarction, mass lesion, hemorrhage, hydrocephalus or extra-axial collection. Vascular: No hyperdense vessel. No evidence of atherosclerotic calcification. Skull: Normal.  No traumatic finding.  No focal bone lesion. Sinuses/Orbits: Sinuses are clear. Orbits appear normal. Mastoids are clear. Other: None significant CTA NECK FINDINGS Aortic arch: Normal Right carotid system: Normal Left carotid system: Normal Vertebral arteries: Normal Skeleton: Minimal spondylosis C5-6.  No significant finding. Other neck: No mass or lymphadenopathy. Upper chest: Normal Review of the MIP images confirms the above findings CTA HEAD FINDINGS Anterior circulation: Both internal carotid arteries are widely patent through the skull base and siphon regions. No siphon calcification. The anterior and middle cerebral vessels are normal without proximal stenosis, aneurysm or vascular malformation. Posterior circulation: Both vertebral arteries are widely patent to the basilar. No basilar stenosis. Posterior circulation branch vessels are  normal. Venous sinuses: Normal Anatomic variants: None Delayed phase: No abnormal enhancement Review of the MIP images confirms the above findings IMPRESSION: Normal head CT. Normal CT angiography of the neck and head. Electronically Signed   By: Paulina Fusi M.D.   On: 10/01/2017 18:41    Speciality Comments: PLQ Eye Exam: 12/29/16 WNL at [facility]. Follow up 1 year.     Procedures:  No procedures performed Allergies: Duloxetine; Flunisolide; Vilazodone; Latex; and Tape   Assessment / Plan:     Visit Diagnoses: Psoriatic arthropathy (HCC): She has no signs of synovitis or dactylitis on exam.  She is been having increased discomfort in her bilateral hands bilateral ankles and bilateral knees.  She does not have any inflammation on exam.  She has no plantar fasciitis or Achilles tendinitis.  She has bilateral SI joint tenderness.  She will continue on her current treatment regimen of Enbrel 50 mg subcutaneous injections every week and Plaquenil 1 tablet in the morning and half tablet in the evening Monday through Friday.  She does not need any refills at this time.  Psoriasis: She has active psoriasis on bilateral ankle joints as well scattered on her torso.  She was given a prescription for clobetasol cream that she can apply topically twice daily.   High risk medication use - Enbrel, PLQ.  She had CBC and CMP drawn on 1319.  She will return in September and every 3 months for lab work.  Standing orders were placed today.  TB gold was drawn on 08/17/2017.- Plan: COMPLETE METABOLIC PANEL WITH GFR, CBC with Differential/Platelet  Fibromyalgia: She continues to have generalized muscle aches and muscle tenderness due to fibromyalgia.  She has chronic insomnia and fatigue.  She has a very interrupted night sleep and wakes up feeling unrested.  We discussed the importance of regularly exercise.  She is also tried massage therapy as well as alternating heat and ice therapy.  She tries to stay  active and stretches on a regular basis as well.  She is on Cymbalta 20 mg every other day.  She reports that when she started increasing the frequency of her Cymbalta causes worsening interstitial cystitis.  She continues to have flares of interstitial cystitis.  DDD (degenerative disc disease), cervical: She has limited range of motion of her C-spine especially with lateral rotation to the left and extension.  She is been having increased discomfort in her neck.  We discussed the importance of proper posture and taking breaks when she is studying.  We also discussed working on neck range of motion.  DDD (degenerative disc disease), lumbar - Chronic pain.  She has midline spinal tenderness in the lumbar region. She has been seen by pain management and back specialist in the past.   Other fatigue: Chronic and worsening related to insomnia.  She was advised to continue to exercise on a regular basis.  Chronic midline low back pain without sciatica: She has chronic midline spinal tenderness in the lumbar region.  Other medical conditions are listed as follows:  History of diabetes mellitus  Prolonged QT interval - H/O   Interstitial cystitis  History of asthma  History of depression  History of hypothyroidism  History of gastric bypass    Orders: Orders Placed This Encounter  Procedures  . COMPLETE METABOLIC PANEL WITH GFR  . CBC with Differential/Platelet   Meds ordered this encounter  Medications  . clobetasol cream (TEMOVATE) 0.05 %    Sig: Apply 1 application topically 2 (two) times daily.    Dispense:  30 g    Refill:  0    Face-to-face time spent with patient was 30 minutes. >50% of time was spent in counseling and coordination of care.  Follow-Up Instructions: Return in about 5 months (around 03/16/2018) for Psoriatic arthritis, Fibromyalgia, DDD.   Gearldine Bienenstock, PA-C   I examined and evaluated the patient with Sherron Ales PA.  Patient had no synovitis on my  examination.  Her generalized pain is coming from fibromyalgia.  The plan of care was discussed as noted above.  [name], MD Note - This record has been created using Animal nutritionist.  Chart creation errors have been sought, but may not always  have been located. Such creation errors do not reflect on  the standard of medical care.

## 2017-10-14 ENCOUNTER — Ambulatory Visit (INDEPENDENT_AMBULATORY_CARE_PROVIDER_SITE_OTHER): Payer: BLUE CROSS/BLUE SHIELD | Admitting: Rheumatology

## 2017-10-14 ENCOUNTER — Encounter: Payer: Self-pay | Admitting: Rheumatology

## 2017-10-14 VITALS — BP 113/76 | HR 75 | Ht 64.0 in | Wt 150.0 lb

## 2017-10-14 DIAGNOSIS — M797 Fibromyalgia: Secondary | ICD-10-CM | POA: Diagnosis not present

## 2017-10-14 DIAGNOSIS — L4050 Arthropathic psoriasis, unspecified: Secondary | ICD-10-CM

## 2017-10-14 DIAGNOSIS — Z8709 Personal history of other diseases of the respiratory system: Secondary | ICD-10-CM

## 2017-10-14 DIAGNOSIS — M5136 Other intervertebral disc degeneration, lumbar region: Secondary | ICD-10-CM

## 2017-10-14 DIAGNOSIS — R9431 Abnormal electrocardiogram [ECG] [EKG]: Secondary | ICD-10-CM | POA: Diagnosis not present

## 2017-10-14 DIAGNOSIS — Z8659 Personal history of other mental and behavioral disorders: Secondary | ICD-10-CM

## 2017-10-14 DIAGNOSIS — N3010 Interstitial cystitis (chronic) without hematuria: Secondary | ICD-10-CM

## 2017-10-14 DIAGNOSIS — M5030 Other cervical disc degeneration, unspecified cervical region: Secondary | ICD-10-CM | POA: Diagnosis not present

## 2017-10-14 DIAGNOSIS — M545 Low back pain: Secondary | ICD-10-CM | POA: Diagnosis not present

## 2017-10-14 DIAGNOSIS — Z9884 Bariatric surgery status: Secondary | ICD-10-CM

## 2017-10-14 DIAGNOSIS — Z79899 Other long term (current) drug therapy: Secondary | ICD-10-CM | POA: Diagnosis not present

## 2017-10-14 DIAGNOSIS — R5383 Other fatigue: Secondary | ICD-10-CM | POA: Diagnosis not present

## 2017-10-14 DIAGNOSIS — M51369 Other intervertebral disc degeneration, lumbar region without mention of lumbar back pain or lower extremity pain: Secondary | ICD-10-CM

## 2017-10-14 DIAGNOSIS — L409 Psoriasis, unspecified: Secondary | ICD-10-CM | POA: Diagnosis not present

## 2017-10-14 DIAGNOSIS — Z8639 Personal history of other endocrine, nutritional and metabolic disease: Secondary | ICD-10-CM

## 2017-10-14 DIAGNOSIS — G8929 Other chronic pain: Secondary | ICD-10-CM

## 2017-10-14 MED ORDER — CLOBETASOL PROPIONATE 0.05 % EX CREA: 1.0000 "application " | TOPICAL_CREAM | Freq: Two times a day (BID) | CUTANEOUS | 0 refills | Status: DC

## 2017-10-14 NOTE — Patient Instructions (Signed)
Standing Labs We placed an order today for your standing lab work.    Please come back and get your standing labs in September and every 3 months   We have open lab Monday through Friday from 8:30-11:30 AM and 1:30-4:00 PM  at the office of Dr. Shaili Deveshwar.   You may experience shorter wait times on Monday and Friday afternoons. The office is located at [address], [address], [place], [place] [address] No appointment is necessary.   Labs are drawn by [facility].  You may receive a bill from [facility] for your lab work. If you have any questions regarding directions or hours of operation,  please call [phone].    

## 2017-10-23 ENCOUNTER — Other Ambulatory Visit: Payer: Self-pay | Admitting: Neurology

## 2017-10-30 ENCOUNTER — Other Ambulatory Visit: Payer: Self-pay | Admitting: Neurology

## 2017-11-05 ENCOUNTER — Other Ambulatory Visit: Payer: Self-pay | Admitting: Neurology

## 2017-11-20 ENCOUNTER — Other Ambulatory Visit: Payer: Self-pay | Admitting: Neurology

## 2017-11-20 ENCOUNTER — Other Ambulatory Visit: Payer: Self-pay | Admitting: Rheumatology

## 2017-11-20 NOTE — Telephone Encounter (Signed)
Last visit: 10/14/2017 Next visit: 03/09/2018 Labs: 08/17/2017 WNL  Eye exam: 12/29/2016  Okay to refill per Dr. Corliss Skainseveshwar.

## 2017-12-03 ENCOUNTER — Other Ambulatory Visit: Payer: Self-pay | Admitting: Rheumatology

## 2017-12-03 NOTE — Telephone Encounter (Signed)
Last visit: 10/14/2017 Next visit: 03/09/2018 Labs: 10/01/2017 TB gold: 08/17/2017 negative   Okay to refill per Dr. Corliss Skainseveshwar.

## 2018-02-23 NOTE — Progress Notes (Deleted)
Office Visit Note  Patient: Michelle [name]             Date of Birth: [birth year]           MRN: [id number]             PCP: Lester [place]., MD Referring: Lester [place]., MD Visit Date: 03/09/2018 Occupation: @GUAROCC @  Subjective:  No chief complaint on file.   History of Present Illness: Michelle [name] is a 42 y.o. female ***   Activities of Daily Living:  Patient reports morning stiffness for *** {minute/hour:19697}.   Patient {ACTIONS;DENIES/REPORTS:21021675::"Denies"} nocturnal pain.  Difficulty dressing/grooming: {ACTIONS;DENIES/REPORTS:21021675::"Denies"} Difficulty climbing stairs: {ACTIONS;DENIES/REPORTS:21021675::"Denies"} Difficulty getting out of chair: {ACTIONS;DENIES/REPORTS:21021675::"Denies"} Difficulty using hands for taps, buttons, cutlery, and/or writing: {ACTIONS;DENIES/REPORTS:21021675::"Denies"}  No Rheumatology ROS completed.   PMFS History:  Patient Active Problem List   Diagnosis Date Noted  . Interstitial cystitis 11/24/2016  . High risk medication use 11/02/2016  . History of depression 11/02/2016  . History of asthma 11/02/2016  . Prolonged QT interval 11/02/2016  . History of gastric bypass 11/02/2016  . History of hypothyroidism 11/02/2016  . History of renal calcinosis 11/02/2016  . Psoriatic arthropathy (HCC) 04/17/2016  . Psoriasis 04/17/2016  . ANA positive 04/17/2016  . Other fatigue 04/17/2016  . DDD (degenerative disc disease), cervical 04/17/2016  . DDD (degenerative disc disease), lumbar 04/17/2016  . History of diabetes mellitus 04/17/2016  . History of migraine 04/17/2016  . Fatty liver 04/17/2016  . Diabetes (HCC)   . Depression   . Fibromyalgia   . Muscle cramps     Past Medical History:  Diagnosis Date  . Depression   . Diabetes (HCC)   . Fibromyalgia   . Migraine   . Muscle cramps     No family history on file. Past Surgical History:  Procedure Laterality Date  . ABDOMINAL HYSTERECTOMY    . CESAREAN  SECTION    . CHOLECYSTECTOMY    . FACET JOINT INJECTION    . GASTRIC BYPASS    . SI Joint Injection     Social History   Social History Narrative   Patient lives at home alone with her husband and she works full time disability .    Right handed.   Caffeine one times per day.    Objective: Vital Signs: There were no vitals taken for this visit.   Physical Exam   Musculoskeletal Exam: ***  CDAI Exam: CDAI Score: Not documented Patient Global Assessment: Not documented; Provider Global Assessment: Not documented Swollen: Not documented; Tender: Not documented Joint Exam   Not documented   There is currently no information documented on the homunculus. Go to the Rheumatology activity and complete the homunculus joint exam.  Investigation: No additional findings.  Imaging: No results found.  Recent Labs: Lab Results  Component Value Date   WBC 5.8 10/01/2017   HGB 13.3 10/01/2017   PLT 205 10/01/2017   NA 140 10/01/2017   K 4.1 10/01/2017   CL 102 10/01/2017   CO2 31 10/01/2017   GLUCOSE 97 10/01/2017   BUN 11 10/01/2017   CREATININE 0.58 10/01/2017   BILITOT 0.3 08/17/2017   ALKPHOS 113 10/29/2016   AST 24 08/17/2017   ALT 23 08/17/2017   PROT 6.6 08/17/2017   ALBUMIN 4.3 10/29/2016   CALCIUM 8.6 (L) 10/01/2017   GFRAA >60 10/01/2017   QFTBGOLDPLUS NEGATIVE 08/17/2017    Speciality Comments: PLQ Eye Exam: 12/29/16 WNL at [facility]. Follow up 1 year.  Procedures:  No procedures performed Allergies: Duloxetine; Flunisolide; Vilazodone; Latex; and Tape   Assessment / Plan:     Visit Diagnoses: Psoriatic arthropathy (HCC)  Psoriasis  High risk medication use - Enbrel 50 mg subcutaneous injections every week and Plaquenil 1 tablet in the morning and half tablet in the evening Monday through Friday.    Fibromyalgia  DDD (degenerative disc disease), cervical  DDD (degenerative disc disease), lumbar  Other fatigue  History of diabetes  mellitus  Prolonged QT interval  Interstitial cystitis  History of asthma  History of depression  History of hypothyroidism  History of gastric bypass   Orders: No orders of the defined types were placed in this encounter.  No orders of the defined types were placed in this encounter.   Face-to-face time spent with patient was *** minutes. Greater than 50% of time was spent in counseling and coordination of care.  Follow-Up Instructions: No follow-ups on file.   Gearldine Bienenstock, PA-C  Note - This record has been created using Dragon software.  Chart creation errors have been sought, but may not always  have been located. Such creation errors do not reflect on  the standard of medical care.

## 2018-03-04 ENCOUNTER — Other Ambulatory Visit: Payer: Self-pay | Admitting: Rheumatology

## 2018-03-04 NOTE — Telephone Encounter (Signed)
Last visit: 10/14/2017 Next visit: 04/05/2018 Labs: 08/17/2017 WNL  Eye exam: 12/29/2016  Okay to refill per Dr. Corliss Skainseveshwar.

## 2018-03-04 NOTE — Telephone Encounter (Signed)
Attempted to call patient and left message on machine to advise patient she is due to update eye exam. Okay to refill 30 day supply of PLQ, per Dr. Corliss Skainseveshwar.

## 2018-03-09 ENCOUNTER — Ambulatory Visit: Payer: BLUE CROSS/BLUE SHIELD | Admitting: Physician Assistant

## 2018-03-22 NOTE — Progress Notes (Signed)
Office Visit Note  Patient: Michelle [name]             Date of Birth: June 30, 1975           MRN: [id number]             PCP: Lester [place]., MD Referring: Lester [place]., MD Visit Date: 04/05/2018 Occupation: @GUAROCC @  Subjective:  Generalized pain     History of Present Illness: Michelle [name] is a 42 y.o. female with history of psoriatic arthritis, fibromyalgia, and DDD.  She is on Enbrel 50 mg sq injections once weekly and PLQ 200 mg in the morning and 100 mg in the evening.  She denies any recent psoriatic arthritis flares.  She denies any SI joint pain, Achilles tenderness, plantar fasciitis at this time.  She denies any active psoriasis.  She denies any joint swelling.  She reports that she had a tummy tuck on 03/24/2018 and was admitted for 1 night for observation following surgery.  She reports that she did not hold her dose of Enbrel prior to surgery.  She reports that she has been having a fibromyalgia flare since her surgery.  She has having generalized muscle aches muscle tenderness.  She has trapezius muscle tension muscle tenderness.  She has bilateral trochanteric bursitis.  Prior to surgery she was going to the gym 3 days/week.  She plans on returning to the gym in January 2020 after being cleared by her surgeon. She gives a history of intermittent oral ulcerations but denies any nasal ulcerations.  She states that she also gets facial rashes occasionally.     Activities of Daily Living:  Patient reports morning stiffness for 3 hours.   Patient Reports nocturnal pain.  Difficulty dressing/grooming: Denies Difficulty climbing stairs: Reports Difficulty getting out of chair: Reports Difficulty using hands for taps, buttons, cutlery, and/or writing: Reports  Review of Systems  Constitutional: Positive for fatigue.  HENT: Positive for mouth sores and mouth dryness. Negative for nose dryness.   Eyes: Positive for dryness. Negative for pain and visual disturbance.    Respiratory: Negative for cough, hemoptysis, shortness of breath and difficulty breathing.   Cardiovascular: Positive for palpitations. Negative for chest pain, hypertension and swelling in legs/feet.  Gastrointestinal: Negative for blood in stool, constipation and diarrhea.  Endocrine: Negative for increased urination.  Genitourinary: Positive for painful urination (HX of IC).  Musculoskeletal: Positive for arthralgias, joint pain, myalgias, morning stiffness, muscle tenderness and myalgias. Negative for joint swelling and muscle weakness.  Skin: Positive for color change. Negative for pallor, rash, hair loss, nodules/bumps, skin tightness, ulcers and sensitivity to sunlight.  Allergic/Immunologic: Negative for susceptible to infections.  Neurological: Negative for dizziness, numbness, headaches and weakness.  Hematological: Negative for swollen glands.  Psychiatric/Behavioral: Positive for depressed mood and sleep disturbance. The patient is nervous/anxious.     PMFS History:  Patient Active Problem List   Diagnosis Date Noted  . Interstitial cystitis 11/24/2016  . High risk medication use 11/02/2016  . History of depression 11/02/2016  . History of asthma 11/02/2016  . Prolonged QT interval 11/02/2016  . History of gastric bypass 11/02/2016  . History of hypothyroidism 11/02/2016  . History of renal calcinosis 11/02/2016  . Psoriatic arthropathy (HCC) 04/17/2016  . Psoriasis 04/17/2016  . ANA positive 04/17/2016  . Other fatigue 04/17/2016  . DDD (degenerative disc disease), cervical 04/17/2016  . DDD (degenerative disc disease), lumbar 04/17/2016  . History of diabetes mellitus 04/17/2016  . History of migraine 04/17/2016  .  Fatty liver 04/17/2016  . Diabetes (HCC)   . Depression   . Fibromyalgia   . Muscle cramps     Past Medical History:  Diagnosis Date  . Depression   . Diabetes (HCC)   . Fibromyalgia   . Migraine   . Muscle cramps     History reviewed. No  pertinent family history. Past Surgical History:  Procedure Laterality Date  . ABDOMINAL HYSTERECTOMY    . CESAREAN SECTION    . CHOLECYSTECTOMY    . FACET JOINT INJECTION    . GASTRIC BYPASS    . SI Joint Injection    . tummy tuck  03/24/2018   Social History   Social History Narrative   Patient lives at home alone with her husband and she works full time disability .    Right handed.   Caffeine one times per day.   Immunization History  Administered Date(s) Administered  . Influenza Inj Mdck Quad Pf 02/13/2017  . Influenza,inj,Quad PF,6+ Mos 01/23/2016  . Influenza-Unspecified 01/23/2016  . Pneumococcal Polysaccharide-23 01/20/2012  . Tdap 07/28/2012    Objective: Vital Signs: BP 100/66 (BP Location: Left Arm, Patient Position: Sitting, Cuff Size: Normal)   Pulse 88   Resp 12   Ht 5\' 4"  (1.626 m)   Wt 143 lb 9.6 oz (65.1 kg)   BMI 24.65 kg/m    Physical Exam Vitals signs and nursing note reviewed.  Constitutional:      Appearance: She is well-developed.  HENT:     Head: Normocephalic and atraumatic.  Eyes:     Conjunctiva/sclera: Conjunctivae normal.  Neck:     Musculoskeletal: Normal range of motion.  Cardiovascular:     Rate and Rhythm: Normal rate and regular rhythm.     Heart sounds: Normal heart sounds.  Pulmonary:     Effort: Pulmonary effort is normal.     Breath sounds: Normal breath sounds.  Abdominal:     General: Bowel sounds are normal.     Palpations: Abdomen is soft.  Lymphadenopathy:     Cervical: No cervical adenopathy.  Skin:    General: Skin is warm and dry.     Capillary Refill: Capillary refill takes less than 2 seconds.  Neurological:     Mental Status: She is alert and oriented to person, place, and time.  Psychiatric:        Behavior: Behavior normal.      Musculoskeletal Exam: C-spine good range of motion with lateral rotation.  She has limited flexion and extension.  She has trapezius muscle tension muscle tenderness.  She  is positive tender points and generalized hyperalgesia on exam.  She has no SI joint tenderness on exam.  Thoracic and lumbar spine good range of motion.  Shoulder joints full range of motion with some discomfort.  Elbow joints, rashes, MCPs and PIPs, DIPs good range of motion with no synovitis.  She is complete fist formation bilaterally.  Hip joints good range of motion with no discomfort.  Has bilateral trochanter bursitis.  Knee joints, ankle joints, MTPs, PIPs, DIPs good range of motion with no synovitis.  No warmth or effusion bilateral knee joints.  CDAI Exam: CDAI Score: Not documented Patient Global Assessment: Not documented; Provider Global Assessment: Not documented Swollen: Not documented; Tender: Not documented Joint Exam   Not documented   There is currently no information documented on the homunculus. Go to the Rheumatology activity and complete the homunculus joint exam.  Investigation: No additional findings.  Imaging: No results  found.  Recent Labs: Lab Results  Component Value Date   WBC 5.8 10/01/2017   HGB 13.3 10/01/2017   PLT 205 10/01/2017   NA 140 10/01/2017   K 4.1 10/01/2017   CL 102 10/01/2017   CO2 31 10/01/2017   GLUCOSE 97 10/01/2017   BUN 11 10/01/2017   CREATININE 0.58 10/01/2017   BILITOT 0.3 08/17/2017   ALKPHOS 113 10/29/2016   AST 24 08/17/2017   ALT 23 08/17/2017   PROT 6.6 08/17/2017   ALBUMIN 4.3 10/29/2016   CALCIUM 8.6 (L) 10/01/2017   GFRAA >60 10/01/2017   QFTBGOLDPLUS NEGATIVE 08/17/2017    Speciality Comments: PLQ Eye Exam: 12/29/16 WNL at [facility]. Follow up 1 year.   Procedures:  Trigger Point Inj Date/Time: 04/05/2018 2:44 PM Performed by: Gearldine Bienenstock, PA-C Authorized by: Gearldine Bienenstock, PA-C   Consent Given by:  Patient Site marked: the procedure site was marked   Timeout: prior to procedure the correct patient, procedure, and site was verified   Indications:  Pain Total # of Trigger Points:   2 Location: neck   Needle Size:  27 G Approach:  Dorsal Medications #1:  0.5 mL lidocaine 1 %; 10 mg triamcinolone acetonide 40 MG/ML Medications #2:  0.5 mL lidocaine 1 %; 10 mg triamcinolone acetonide 40 MG/ML Patient tolerance:  Patient tolerated the procedure well with no immediate complications   Allergies: Duloxetine; Flunisolide; Vilazodone; Latex; and Tape   Assessment / Plan:     Visit Diagnoses: Psoriatic arthropathy (HCC): She has no synovitis or dactylitis on exam.  She has not had any recent psoriatic arthritis flares.  She is clinically doing well on Enbrel 50 mg subcutaneous injections once weekly and Plaquenil 200 mg in the morning and 100 mg in the evening.  She has not missed any doses of her medications recently.  She had an abdomen panniculectomy on 03/24/18, and she did not hold her medications.  She has not had any complications. She has no Achilles tenderness or plantar fasciitis at this time.  No SI joint tenderness on exam.  She has no active psoriasis or nail dystrophy.  She will continue on the current treatment regimen of Enbrel 50 mg abuse injections once weekly and Plaquenil 1 tablet in the morning and half tablet in the evening.  She does not need any refills at this time.  She was advised to notify us if she develops increased joint pain or joint swelling.  She will follow-up in the office in 6 months.  Psoriasis: She has no active psoriasis.  She has no nail pitting or nail dystrophy.  High risk medication use - Enbrel 50 mg subcutaneous injections every week and Plaquenil 1 tablet in the morning and half tablet in the evening Monday through Friday. Last TB gold negative on 08/17/2017.  Last Plaquenil eye exam normal on 12/29/2016.   She had surgery on 03/24/2018 and lab work was obtained at that time.  She will have the lab work faxed to our office.  Fibromyalgia: She has generalized hyperalgesia on exam.  She is positive tender points.  She has been having trapezius  muscle spasms bilaterally.  She requested bilateral cortisone injections.  She tolerated the procedure well.  The procedure note is completed above.  She continues to have bilateral trochanteric bursitis.  She recently had a tummy tuck on 03/24/2018 prior to surgery she was going to the gym 3 times a week.  When she is cleared by her surgeon  she plans on returning to the gym.  She was encouraged to stay active and exercise on a regular basis once cleared.  She will continue on Cymbalta 20 mg 1 capsule by mouth every other day and flexeril PRN.  She will continue seeing her pain management specialist.   DDD (degenerative disc disease), cervical: She has limited flexion and extension.  She has good lateral rotation.  She has no symptoms of radiculopathy at this time. She has been experiencing trapezius muscle spasms.   DDD (degenerative disc disease), lumbar: No midline spinal tenderness.   Other fatigue: Chronic   Trapezius muscle spasm: She has been experiencing trapezius muscle spasms bilaterally.  She has tenderness and tension on exam today.  She requested bilateral trigger point injections.  She tolerated the procedure well.  The procedure note is completed above.  Other medical conditions are listed as follows:  History of diabetes mellitus  Interstitial cystitis  History of asthma  History of depression  History of hypothyroidism  History of gastric bypass   Orders: Orders Placed This Encounter  Procedures  . Trigger Point Inj   No orders of the defined types were placed in this encounter.   Face-to-face time spent with patient was 30 minutes. Greater than 50% of time was spent in counseling and coordination of care.  Follow-Up Instructions: Return for Psoriatic arthritis, Fibromyalgia, DDD.   Gearldine Bienenstockaylor M Jerrica Thorman, PA-C  Note - This record has been created using Dragon software.  Chart creation errors have been sought, but may not always  have been located. Such creation errors  do not reflect on  the standard of medical care.

## 2018-03-23 ENCOUNTER — Other Ambulatory Visit: Payer: Self-pay | Admitting: Rheumatology

## 2018-03-23 NOTE — Telephone Encounter (Signed)
Last visit: 10/14/2017 Next visit: 04/05/2018 Labs: 08/17/2017 WNL  TB Gold: 08/17/17  Left message for patient to advise she is due to update labs.

## 2018-03-24 HISTORY — PX: OTHER SURGICAL HISTORY: SHX169

## 2018-04-05 ENCOUNTER — Encounter: Payer: Self-pay | Admitting: Physician Assistant

## 2018-04-05 ENCOUNTER — Ambulatory Visit (INDEPENDENT_AMBULATORY_CARE_PROVIDER_SITE_OTHER): Payer: BLUE CROSS/BLUE SHIELD | Admitting: Physician Assistant

## 2018-04-05 VITALS — BP 100/66 | HR 88 | Resp 12 | Ht 64.0 in | Wt 143.6 lb

## 2018-04-05 DIAGNOSIS — Z8709 Personal history of other diseases of the respiratory system: Secondary | ICD-10-CM

## 2018-04-05 DIAGNOSIS — M797 Fibromyalgia: Secondary | ICD-10-CM | POA: Diagnosis not present

## 2018-04-05 DIAGNOSIS — Z8659 Personal history of other mental and behavioral disorders: Secondary | ICD-10-CM

## 2018-04-05 DIAGNOSIS — M5136 Other intervertebral disc degeneration, lumbar region: Secondary | ICD-10-CM

## 2018-04-05 DIAGNOSIS — M62838 Other muscle spasm: Secondary | ICD-10-CM | POA: Diagnosis not present

## 2018-04-05 DIAGNOSIS — R5383 Other fatigue: Secondary | ICD-10-CM

## 2018-04-05 DIAGNOSIS — Z79899 Other long term (current) drug therapy: Secondary | ICD-10-CM | POA: Diagnosis not present

## 2018-04-05 DIAGNOSIS — L4050 Arthropathic psoriasis, unspecified: Secondary | ICD-10-CM

## 2018-04-05 DIAGNOSIS — Z8639 Personal history of other endocrine, nutritional and metabolic disease: Secondary | ICD-10-CM

## 2018-04-05 DIAGNOSIS — N3010 Interstitial cystitis (chronic) without hematuria: Secondary | ICD-10-CM

## 2018-04-05 DIAGNOSIS — L409 Psoriasis, unspecified: Secondary | ICD-10-CM | POA: Diagnosis not present

## 2018-04-05 DIAGNOSIS — Z9884 Bariatric surgery status: Secondary | ICD-10-CM

## 2018-04-05 DIAGNOSIS — M5030 Other cervical disc degeneration, unspecified cervical region: Secondary | ICD-10-CM

## 2018-04-05 MED ORDER — TRIAMCINOLONE ACETONIDE 40 MG/ML IJ SUSP
10.0000 mg | INTRAMUSCULAR | Status: AC | PRN
  Administered 2018-04-05: 10 mg via INTRAMUSCULAR

## 2018-04-05 MED ORDER — LIDOCAINE HCL 1 % IJ SOLN
0.5000 mL | INTRAMUSCULAR | Status: AC | PRN
  Administered 2018-04-05: .5 mL

## 2018-05-01 ENCOUNTER — Other Ambulatory Visit: Payer: Self-pay | Admitting: Rheumatology

## 2018-05-01 ENCOUNTER — Other Ambulatory Visit: Payer: Self-pay | Admitting: Neurology

## 2018-05-03 NOTE — Telephone Encounter (Signed)
Last visit: 04/05/18 Next Visit: 10/04/18  Okay to refill per Dr. Corliss Skains

## 2018-05-18 ENCOUNTER — Other Ambulatory Visit: Payer: Self-pay | Admitting: Rheumatology

## 2018-05-18 NOTE — Telephone Encounter (Addendum)
Last visit: 04/05/18 Next Visit: 10/04/18 Labs: 08/17/2017 WNL  PLQ Eye Exam: 12/29/2016

## 2018-05-20 NOTE — Telephone Encounter (Signed)
Attempted to contact the patient and left message to advised patient we have not received updated labs.

## 2018-05-25 NOTE — Telephone Encounter (Signed)
ok 

## 2018-05-25 NOTE — Telephone Encounter (Signed)
Spoke with patient and advise we have not received labs. Patient states she will have labs drawn for [place] this weeks. Patient will call the office with the fax number to the lab and will fax the order.   Last visit: 04/05/18 Next Visit: 10/04/18 Labs: 08/17/17 WNL PLQ Eye Exam: 12/29/2016  Okay to refill 30 day supply PLQ?

## 2018-05-28 ENCOUNTER — Telehealth: Payer: Self-pay | Admitting: Rheumatology

## 2018-05-28 NOTE — Telephone Encounter (Signed)
Patient left a message stating [facility] will be faxing her lab results that she had done at the end of November. Patient needs refill on Plaquenil, and Enbrel sent to pharmacy.

## 2018-05-31 NOTE — Telephone Encounter (Signed)
Have not received labs. Patient advised.

## 2018-06-01 ENCOUNTER — Other Ambulatory Visit: Payer: Self-pay | Admitting: *Deleted

## 2018-06-01 DIAGNOSIS — Z79899 Other long term (current) drug therapy: Secondary | ICD-10-CM

## 2018-06-01 MED ORDER — HYDROXYCHLOROQUINE SULFATE 200 MG PO TABS: ORAL_TABLET | ORAL | 0 refills | Status: DC

## 2018-06-01 MED ORDER — ETANERCEPT 50 MG/ML ~~LOC~~ SOAJ: SUBCUTANEOUS | 0 refills | Status: DC

## 2018-06-01 NOTE — Addendum Note (Signed)
Addended by: Henriette Combs on: 06/01/2018 08:52 AM   Modules accepted: Orders

## 2018-06-01 NOTE — Telephone Encounter (Signed)
Last visit: 04/05/18 Next visit: 10/04/18 Labs: 03/16/18 WBC 3.6 Calcium 8.3 PLQ Eye Exam: 12/29/16 WNL Tb gold: 08/17/17 Neg   Okay to refill per Dr. Corliss Skains

## 2018-06-03 LAB — CBC WITH DIFFERENTIAL/PLATELET
Absolute Monocytes: 526 cells/uL (ref 200–950)
Basophils Absolute: 22 cells/uL (ref 0–200)
Basophils Relative: 0.4 %
Eosinophils Absolute: 112 cells/uL (ref 15–500)
Eosinophils Relative: 2 %
HCT: 34.1 % — ABNORMAL LOW (ref 35.0–45.0)
Hemoglobin: 11.6 g/dL — ABNORMAL LOW (ref 11.7–15.5)
Lymphs Abs: 2520 cells/uL (ref 850–3900)
MCH: 30.5 pg (ref 27.0–33.0)
MCHC: 34 g/dL (ref 32.0–36.0)
MCV: 89.7 fL (ref 80.0–100.0)
MONOS PCT: 9.4 %
MPV: 10.2 fL (ref 7.5–12.5)
Neutro Abs: 2419 cells/uL (ref 1500–7800)
Neutrophils Relative %: 43.2 %
Platelets: 239 10*3/uL (ref 140–400)
RBC: 3.8 10*6/uL (ref 3.80–5.10)
RDW: 12.2 % (ref 11.0–15.0)
Total Lymphocyte: 45 %
WBC: 5.6 10*3/uL (ref 3.8–10.8)

## 2018-06-04 NOTE — Progress Notes (Signed)
Hgb and Hct are low.  Please notify patient.  We will continue to monitor.  Please send results to PCP.

## 2018-06-29 ENCOUNTER — Other Ambulatory Visit: Payer: Self-pay | Admitting: Rheumatology

## 2018-06-29 NOTE — Telephone Encounter (Signed)
Last visit: 04/05/18 Next Visit: 10/04/18 Labs:03/16/18 WBC 3.6 Calcium 8.3 PLQ Eye Exam: 12/29/16 WNL  Patient has her eye exam scheduled for this month. Patient will have results faxed.   Okay to refill 30 day supply per Dr. Corliss Skains

## 2018-07-20 ENCOUNTER — Telehealth: Payer: Self-pay | Admitting: Rheumatology

## 2018-07-20 MED ORDER — HYDROXYCHLOROQUINE SULFATE 200 MG PO TABS: ORAL_TABLET | ORAL | 0 refills | Status: DC

## 2018-07-20 MED ORDER — DICLOFENAC SODIUM 1 % TD GEL: TRANSDERMAL | 2 refills | Status: DC

## 2018-07-20 NOTE — Telephone Encounter (Signed)
Last visit: 04/05/18 Next Visit: 10/04/18 Labs: 06/03/18 Hgb and Hct are low. PLQ Eye Exam:12/29/2016  Okay to refill per Dr. Corliss Skains

## 2018-07-20 NOTE — Telephone Encounter (Signed)
Patient called requesting prescription refill of Plaquenil and Voltaren Gel.  Patient states she was due for her Plaquenil eye exam, but her appointment was cancelled.  Patient is requesting prescription refills to be sent to [facility] at [address] in [place].

## 2018-08-14 ENCOUNTER — Other Ambulatory Visit: Payer: Self-pay | Admitting: Rheumatology

## 2018-08-14 DIAGNOSIS — Z79899 Other long term (current) drug therapy: Secondary | ICD-10-CM

## 2018-08-16 NOTE — Telephone Encounter (Signed)
Last Visit: 04/05/2018 Next Visit: 10/04/2018 Labs: 06/03/2018 CBC Hgb and Hct are low. CMP 03/16/2018  TB Gold: 08/17/2017 negative   Advised patient she is due to udpate labs, patient verbalized understanding. Patient will update labs at [facility] and have them faxed over.   Okay to refill 30 day supply, per Dr. Corliss Skains.

## 2018-08-18 ENCOUNTER — Other Ambulatory Visit: Payer: Self-pay | Admitting: Rheumatology

## 2018-08-27 ENCOUNTER — Telehealth: Payer: Self-pay | Admitting: Rheumatology

## 2018-08-27 DIAGNOSIS — Z79899 Other long term (current) drug therapy: Secondary | ICD-10-CM

## 2018-08-27 DIAGNOSIS — Z9225 Personal history of immunosupression therapy: Secondary | ICD-10-CM

## 2018-08-27 NOTE — Telephone Encounter (Signed)
Lab orders released.  

## 2018-08-27 NOTE — Telephone Encounter (Signed)
Patient called requesting labwork orders to be sent to [facility] in [facility].  Patient states she will be going today.

## 2018-09-02 ENCOUNTER — Ambulatory Visit: Payer: BLUE CROSS/BLUE SHIELD | Admitting: Orthopaedic Surgery

## 2018-09-09 ENCOUNTER — Ambulatory Visit: Payer: BLUE CROSS/BLUE SHIELD | Admitting: Orthopedic Surgery

## 2018-09-15 ENCOUNTER — Other Ambulatory Visit: Payer: Self-pay | Admitting: Rheumatology

## 2018-09-15 NOTE — Telephone Encounter (Signed)
ok 

## 2018-09-15 NOTE — Telephone Encounter (Signed)
Last Visit: 04/05/2018 Next Visit: 10/04/2018 Labs: 06/03/2018 CBC Hgb and Hct are low. CMP 03/16/2018  TB Gold: 08/17/2017 negative  Patient advised she is due to update labs. Patient will update this week.   Okay to refill 30 day supply Enbrel?

## 2018-09-19 ENCOUNTER — Other Ambulatory Visit: Payer: Self-pay | Admitting: Rheumatology

## 2018-09-20 NOTE — Progress Notes (Deleted)
Office Visit Note  Patient: Michelle [name]             Date of Birth: March 05, 1976           MRN: [id number]             PCP: Lester [place]., MD Referring: Lester [place]., MD Visit Date: 10/04/2018 Occupation: @GUAROCC @  Subjective:  No chief complaint on file.   Does she want to switch to the Enbrel mini?  Enbrel SureClick 50 mg every 7 days and Plaquenil 200 mg 1 tablet in the morning and half a tablet in the evening.  Last TB gold negative on 09/23/2018 will monitor yearly.  Most recent CBC/CMP showed worsening anemia, low calcium, and borderline elevated ALT on 09/23/2018.  Will monitor every 3 months and standing orders are in place.  She received the flu vaccine in October and previously Pneumovax 23.  Recommend Prevnar 13 vaccine as indicated.   History of Present Illness: Michelle [name] is a 43 y.o. female ***   Activities of Daily Living:  Patient reports morning stiffness for *** {minute/hour:19697}.   Patient {ACTIONS;DENIES/REPORTS:21021675::"Denies"} nocturnal pain.  Difficulty dressing/grooming: {ACTIONS;DENIES/REPORTS:21021675::"Denies"} Difficulty climbing stairs: {ACTIONS;DENIES/REPORTS:21021675::"Denies"} Difficulty getting out of chair: {ACTIONS;DENIES/REPORTS:21021675::"Denies"} Difficulty using hands for taps, buttons, cutlery, and/or writing: {ACTIONS;DENIES/REPORTS:21021675::"Denies"}  No Rheumatology ROS completed.   PMFS History:  Patient Active Problem List   Diagnosis Date Noted  . Interstitial cystitis 11/24/2016  . High risk medication use 11/02/2016  . History of depression 11/02/2016  . History of asthma 11/02/2016  . Prolonged QT interval 11/02/2016  . History of gastric bypass 11/02/2016  . History of hypothyroidism 11/02/2016  . History of renal calcinosis 11/02/2016  . Psoriatic arthropathy (HCC) 04/17/2016  . Psoriasis 04/17/2016  . ANA positive 04/17/2016  . Other fatigue 04/17/2016  . DDD (degenerative disc disease), cervical  04/17/2016  . DDD (degenerative disc disease), lumbar 04/17/2016  . History of diabetes mellitus 04/17/2016  . History of migraine 04/17/2016  . Fatty liver 04/17/2016  . Diabetes (HCC)   . Depression   . Fibromyalgia   . Muscle cramps     Past Medical History:  Diagnosis Date  . Depression   . Diabetes (HCC)   . Fibromyalgia   . Migraine   . Muscle cramps     No family history on file. Past Surgical History:  Procedure Laterality Date  . ABDOMINAL HYSTERECTOMY    . CESAREAN SECTION    . CHOLECYSTECTOMY    . FACET JOINT INJECTION    . GASTRIC BYPASS    . SI Joint Injection    . tummy tuck  03/24/2018   Social History   Social History Narrative   Patient lives at home alone with her husband and she works full time disability .    Right handed.   Caffeine one times per day.   Immunization History  Administered Date(s) Administered  . Influenza Inj Mdck Quad Pf 02/13/2017  . Influenza,inj,Quad PF,6+ Mos 01/23/2016  . Influenza-Unspecified 01/23/2016  . Pneumococcal Polysaccharide-23 01/20/2012  . Tdap 07/28/2012     Objective: Vital Signs: There were no vitals taken for this visit.   Physical Exam   Musculoskeletal Exam: ***  CDAI Exam: CDAI Score: Not documented Patient Global Assessment: Not documented; Provider Global Assessment: Not documented Swollen: Not documented; Tender: Not documented Joint Exam   Not documented   There is currently no information documented on the homunculus. Go to the Rheumatology activity and complete the homunculus joint exam.  Investigation: No additional findings.  Imaging: No results found.  Recent Labs: Lab Results  Component Value Date   WBC 5.6 06/03/2018   HGB 11.6 (L) 06/03/2018   PLT 239 06/03/2018   NA 140 10/01/2017   K 4.1 10/01/2017   CL 102 10/01/2017   CO2 31 10/01/2017   GLUCOSE 97 10/01/2017   BUN 11 10/01/2017   CREATININE 0.58 10/01/2017   BILITOT 0.3 08/17/2017   ALKPHOS 113 10/29/2016    AST 24 08/17/2017   ALT 23 08/17/2017   PROT 6.6 08/17/2017   ALBUMIN 4.3 10/29/2016   CALCIUM 8.6 (L) 10/01/2017   GFRAA >60 10/01/2017   QFTBGOLDPLUS NEGATIVE 08/17/2017    Speciality Comments: PLQ Eye Exam: 12/29/16 WNL at [facility]. Follow up 1 year.   Procedures:  No procedures performed Allergies: Duloxetine; Flunisolide; Vilazodone; Latex; and Tape   Assessment / Plan:     Visit Diagnoses: No diagnosis found.   Orders: No orders of the defined types were placed in this encounter.  No orders of the defined types were placed in this encounter.   Face-to-face time spent with patient was *** minutes. Greater than 50% of time was spent in counseling and coordination of care.  Follow-Up Instructions: No follow-ups on file.   Gearldine Bienenstockaylor M , PA-C  Note - This record has been created using Dragon software.  Chart creation errors have been sought, but may not always  have been located. Such creation errors do not reflect on  the standard of medical care.

## 2018-09-24 NOTE — Telephone Encounter (Signed)
WBC count is borderline low.  RBC count, hgb, and hct are low.  Please notify patient that her anemia is worsening.  Please forward labs to PCP for further evaluation.    Calcium is extremely low.  Please notify patient and advise her to take a calcium supplement and follow up with PCP.  ALT borderline elevated.  Please advised patient to avoid tylenol, NSAIDs, and alcohol.

## 2018-09-26 LAB — CBC WITH DIFFERENTIAL/PLATELET
Absolute Monocytes: 422 cells/uL (ref 200–950)
Basophils Absolute: 11 cells/uL (ref 0–200)
Basophils Relative: 0.3 %
Eosinophils Absolute: 111 cells/uL (ref 15–500)
Eosinophils Relative: 3 %
HCT: 32.3 % — ABNORMAL LOW (ref 35.0–45.0)
Hemoglobin: 10.9 g/dL — ABNORMAL LOW (ref 11.7–15.5)
Lymphs Abs: 1565 cells/uL (ref 850–3900)
MCH: 30.7 pg (ref 27.0–33.0)
MCHC: 33.7 g/dL (ref 32.0–36.0)
MCV: 91 fL (ref 80.0–100.0)
MPV: 10.6 fL (ref 7.5–12.5)
Monocytes Relative: 11.4 %
Neutro Abs: 1591 cells/uL (ref 1500–7800)
Neutrophils Relative %: 43 %
Platelets: 210 10*3/uL (ref 140–400)
RBC: 3.55 10*6/uL — ABNORMAL LOW (ref 3.80–5.10)
RDW: 12.5 % (ref 11.0–15.0)
Total Lymphocyte: 42.3 %
WBC: 3.7 10*3/uL — ABNORMAL LOW (ref 3.8–10.8)

## 2018-09-26 LAB — QUANTIFERON-TB GOLD PLUS
Mitogen-NIL: 7.39 IU/mL
NIL: 0.02 IU/mL
QuantiFERON-TB Gold Plus: NEGATIVE
TB1-NIL: 0.02 IU/mL
TB2-NIL: 0.02 IU/mL

## 2018-09-26 LAB — COMPLETE METABOLIC PANEL WITH GFR
AG Ratio: 1.8 (calc) (ref 1.0–2.5)
ALT: 32 U/L — ABNORMAL HIGH (ref 6–29)
AST: 22 U/L (ref 10–35)
Albumin: 4.2 g/dL (ref 3.6–5.1)
Alkaline phosphatase (APISO): 77 U/L (ref 31–125)
BUN: 7 mg/dL (ref 7–25)
CO2: 25 mmol/L (ref 20–32)
Calcium: 6.8 mg/dL — ABNORMAL LOW (ref 8.6–10.2)
Chloride: 109 mmol/L (ref 98–110)
Creat: 0.54 mg/dL (ref 0.50–1.10)
GFR, Est African American: 132 mL/min/{1.73_m2} (ref >=60)
GFR, Est Non African American: 114 mL/min/{1.73_m2} (ref >=60)
Globulin: 2.3 g/dL (calc) (ref 1.9–3.7)
Glucose, Bld: 78 mg/dL (ref 65–139)
Potassium: 3.7 mmol/L (ref 3.5–5.3)
Sodium: 142 mmol/L (ref 135–146)
Total Bilirubin: 0.4 mg/dL (ref 0.2–1.2)
Total Protein: 6.5 g/dL (ref 6.1–8.1)

## 2018-10-04 ENCOUNTER — Ambulatory Visit: Payer: Self-pay | Admitting: Physician Assistant

## 2018-10-05 ENCOUNTER — Other Ambulatory Visit: Payer: Self-pay | Admitting: Rheumatology

## 2018-10-05 NOTE — Telephone Encounter (Signed)
Last Visit: 04/05/2018 Next Visit: message sent to the front desk to schedule.  Labs: 09/23/2018 WBC count is borderline low. RBC count, hgb, and hct are low. Calcium is extremely low. ALT borderline elevated.  Eye exam: 12/29/2016  Attempted to contact patient and left message on machine to advise patient she is due to update eye exam.   Okay to refill 30 day supply, per Dr. Estanislado Pandy.

## 2018-10-14 ENCOUNTER — Other Ambulatory Visit: Payer: Self-pay | Admitting: Rheumatology

## 2018-10-14 NOTE — Telephone Encounter (Signed)
Last Visit:04/05/2018 Next Visit: 11/12/2018 Labs: 09/23/2018 WBC count is borderline low. RBC count, hgb, and hct are low. Calcium is extremely low. ALT borderline elevated TB Gold: 09/23/18 Neg   Okay to refill per Dr. Estanislado Pandy

## 2018-10-18 ENCOUNTER — Other Ambulatory Visit: Payer: Self-pay | Admitting: Rheumatology

## 2018-10-29 NOTE — Progress Notes (Deleted)
Office Visit Note  Patient: Michelle [name]             Date of Birth: 10/21/1975           MRN: [id number]             PCP: Lester CarolinaMcFadden, John C., MD Referring: Lester CarolinaMcFadden, John C., MD Visit Date: 11/12/2018 Occupation: @GUAROCC @  Subjective:  No chief complaint on file.   History of Present Illness: Michelle ReMarlena Outman is a 43 y.o. female ***   Activities of Daily Living:  Patient reports morning stiffness for *** {minute/hour:19697}.   Patient {ACTIONS;DENIES/REPORTS:21021675::"Denies"} nocturnal pain.  Difficulty dressing/grooming: {ACTIONS;DENIES/REPORTS:21021675::"Denies"} Difficulty climbing stairs: {ACTIONS;DENIES/REPORTS:21021675::"Denies"} Difficulty getting out of chair: {ACTIONS;DENIES/REPORTS:21021675::"Denies"} Difficulty using hands for taps, buttons, cutlery, and/or writing: {ACTIONS;DENIES/REPORTS:21021675::"Denies"}  No Rheumatology ROS completed.   PMFS History:  Patient Active Problem List   Diagnosis Date Noted  . Interstitial cystitis 11/24/2016  . High risk medication use 11/02/2016  . History of depression 11/02/2016  . History of asthma 11/02/2016  . Prolonged QT interval 11/02/2016  . History of gastric bypass 11/02/2016  . History of hypothyroidism 11/02/2016  . History of renal calcinosis 11/02/2016  . Psoriatic arthropathy (HCC) 04/17/2016  . Psoriasis 04/17/2016  . ANA positive 04/17/2016  . Other fatigue 04/17/2016  . DDD (degenerative disc disease), cervical 04/17/2016  . DDD (degenerative disc disease), lumbar 04/17/2016  . History of diabetes mellitus 04/17/2016  . History of migraine 04/17/2016  . Fatty liver 04/17/2016  . Diabetes (HCC)   . Depression   . Fibromyalgia   . Muscle cramps     Past Medical History:  Diagnosis Date  . Depression   . Diabetes (HCC)   . Fibromyalgia   . Migraine   . Muscle cramps     No family history on file. Past Surgical History:  Procedure Laterality Date  . ABDOMINAL HYSTERECTOMY    . CESAREAN  SECTION    . CHOLECYSTECTOMY    . FACET JOINT INJECTION    . GASTRIC BYPASS    . SI Joint Injection    . tummy tuck  03/24/2018   Social History   Social History Narrative   Patient lives at home alone with her husband and she works full time disability .    Right handed.   Caffeine one times per day.   Immunization History  Administered Date(s) Administered  . Influenza Inj Mdck Quad Pf 02/13/2017  . Influenza,inj,Quad PF,6+ Mos 01/23/2016, 01/27/2018  . Influenza-Unspecified 01/23/2016  . Pneumococcal Polysaccharide-23 01/20/2012  . Tdap 07/28/2012     Objective: Vital Signs: There were no vitals taken for this visit.   Physical Exam   Musculoskeletal Exam: ***  CDAI Exam: CDAI Score: - Patient Global: -; Provider Global: - Swollen: -; Tender: - Joint Exam   No joint exam has been documented for this visit   There is currently no information documented on the homunculus. Go to the Rheumatology activity and complete the homunculus joint exam.  Investigation: No additional findings.  Imaging: No results found.  Recent Labs: Lab Results  Component Value Date   WBC 3.7 (L) 09/23/2018   HGB 10.9 (L) 09/23/2018   PLT 210 09/23/2018   NA 142 09/23/2018   K 3.7 09/23/2018   CL 109 09/23/2018   CO2 25 09/23/2018   GLUCOSE 78 09/23/2018   BUN 7 09/23/2018   CREATININE 0.54 09/23/2018   BILITOT 0.4 09/23/2018   ALKPHOS 113 10/29/2016   AST 22 09/23/2018   ALT 32 (H)  09/23/2018   PROT 6.5 09/23/2018   ALBUMIN 4.3 10/29/2016   CALCIUM 6.8 (L) 09/23/2018   GFRAA 132 09/23/2018   QFTBGOLDPLUS NEGATIVE 09/23/2018    Speciality Comments: PLQ Eye Exam: 12/29/16 WNL at [facility]. Follow up 1 year.   Procedures:  No procedures performed Allergies: Duloxetine, Flunisolide, Vilazodone, Latex, and Tape   Assessment / Plan:     Visit Diagnoses: No diagnosis found.  Orders: No orders of the defined types were placed in this encounter.  No orders  of the defined types were placed in this encounter.   Face-to-face time spent with patient was *** minutes. Greater than 50% of time was spent in counseling and coordination of care.  Follow-Up Instructions: No follow-ups on file.   Earnestine Mealing, CMA  Note - This record has been created using Editor, commissioning.  Chart creation errors have been sought, but may not always  have been located. Such creation errors do not reflect on  the standard of medical care.

## 2018-11-03 ENCOUNTER — Telehealth: Payer: Self-pay | Admitting: Rheumatology

## 2018-11-03 NOTE — Telephone Encounter (Signed)
TB Gold results faxed to patient. Patient advised.

## 2018-11-03 NOTE — Telephone Encounter (Signed)
Patient requesting her last TB gold results to be faxed to her work. Job is asking for a copy. Fax# [phone] Attn. Hulen Skains

## 2018-11-08 ENCOUNTER — Other Ambulatory Visit: Payer: Self-pay | Admitting: Rheumatology

## 2018-11-08 NOTE — Telephone Encounter (Signed)
Last Visit: 04/05/2018 Next Visit: 11/12/2018 Labs: 09/23/2018 WBC count is borderline low. RBC count, hgb, and hct are low. Calcium is extremely low. ALT borderline elevated.  Eye exam: 12/29/2016   Attempted to contact patient and left message on machine to advise patient she needs an updated PLQ eye exam.   Okay to refill 30 day supply, per Dr. Estanislado Pandy.

## 2018-11-11 ENCOUNTER — Ambulatory Visit (INDEPENDENT_AMBULATORY_CARE_PROVIDER_SITE_OTHER): Payer: BC Managed Care – PPO | Admitting: Physician Assistant

## 2018-11-11 ENCOUNTER — Other Ambulatory Visit: Payer: Self-pay

## 2018-11-11 ENCOUNTER — Encounter: Payer: Self-pay | Admitting: Physician Assistant

## 2018-11-11 VITALS — BP 120/82 | HR 78 | Resp 12 | Ht 64.0 in | Wt 138.0 lb

## 2018-11-11 DIAGNOSIS — L4050 Arthropathic psoriasis, unspecified: Secondary | ICD-10-CM

## 2018-11-11 DIAGNOSIS — L409 Psoriasis, unspecified: Secondary | ICD-10-CM | POA: Diagnosis not present

## 2018-11-11 DIAGNOSIS — Z79899 Other long term (current) drug therapy: Secondary | ICD-10-CM | POA: Diagnosis not present

## 2018-11-11 DIAGNOSIS — M5136 Other intervertebral disc degeneration, lumbar region: Secondary | ICD-10-CM

## 2018-11-11 DIAGNOSIS — M5030 Other cervical disc degeneration, unspecified cervical region: Secondary | ICD-10-CM

## 2018-11-11 DIAGNOSIS — Z9884 Bariatric surgery status: Secondary | ICD-10-CM

## 2018-11-11 DIAGNOSIS — M62838 Other muscle spasm: Secondary | ICD-10-CM

## 2018-11-11 DIAGNOSIS — R5383 Other fatigue: Secondary | ICD-10-CM

## 2018-11-11 DIAGNOSIS — N3010 Interstitial cystitis (chronic) without hematuria: Secondary | ICD-10-CM

## 2018-11-11 DIAGNOSIS — Z87898 Personal history of other specified conditions: Secondary | ICD-10-CM

## 2018-11-11 DIAGNOSIS — Z8709 Personal history of other diseases of the respiratory system: Secondary | ICD-10-CM

## 2018-11-11 DIAGNOSIS — Z8639 Personal history of other endocrine, nutritional and metabolic disease: Secondary | ICD-10-CM

## 2018-11-11 DIAGNOSIS — M797 Fibromyalgia: Secondary | ICD-10-CM

## 2018-11-11 DIAGNOSIS — Z8659 Personal history of other mental and behavioral disorders: Secondary | ICD-10-CM

## 2018-11-11 MED ORDER — CLOBETASOL PROPIONATE 0.05 % EX CREA: 1.0000 "application " | TOPICAL_CREAM | Freq: Two times a day (BID) | CUTANEOUS | 0 refills | Status: DC

## 2018-11-11 MED ORDER — LIDOCAINE HCL 1 % IJ SOLN
0.5000 mL | INTRAMUSCULAR | Status: AC | PRN
  Administered 2018-11-11: .5 mL

## 2018-11-11 MED ORDER — LIDOCAINE 5 % EX PTCH: 1.0000 | MEDICATED_PATCH | Freq: Every day | CUTANEOUS | 1 refills | Status: AC | PRN

## 2018-11-11 MED ORDER — TRIAMCINOLONE ACETONIDE 40 MG/ML IJ SUSP
10.0000 mg | INTRAMUSCULAR | Status: AC | PRN
  Administered 2018-11-11: 10 mg via INTRAMUSCULAR

## 2018-11-11 NOTE — Progress Notes (Signed)
Office Visit Note  Patient: Michelle [name]             Date of Birth: 04/29/1975           MRN: [id number]             PCP: Lester CarolinaMcFadden, John C., MD Referring: Lester CarolinaMcFadden, John C., MD Visit Date: 11/11/2018 Occupation: @GUAROCC @  Subjective:  Fibromyalgia flare   History of Present Illness: Michelle ReMarlena Maul is a 43 y.o. female with history of psoriatic arthritis, fibromyalgia, and DDD.  She is on Enbrel 50 mg sq once weekly and plaquenil 200 mg 1 tablet in the morning and 1/2 tablet in the evening M-F.  She has not had any recent psoriatic arthritis flares.  She continues to have intermittent left knee joint pain.  She states that she currently has a bruise on the left knee and has noted some swelling but denies any falls or injuries recently.  She has noted some psoriasis on her lower extremities that started 4 to 5 days ago.  She has been applying topical agents but would like a refill of clobetasol. She presents today having a fibromyalgia flare.  She states the flare started 1 week ago.  She has generalized muscle aches muscle tenderness.  She is having trapezius muscle tension and muscle spasms bilaterally.  She is been experiencing migraines for the past several days due to the muscle tension.  She would like trigger point injections today.  She is been having difficulty sleeping at night due to discomfort she has been experiencing.  She has fatigue is related to insomnia.  She has been using Voltaren gel and Lidoderm patches as needed for pain relief.  She would like a refill of Lidoderm patches today. She continues to take Cymbalta 20 mg 1 capsule every 3 days and Flexeril as needed for muscle spasms.    Activities of Daily Living:  Patient reports morning stiffness for 4 hours.   Patient Reports nocturnal pain.  Difficulty dressing/grooming: Denies Difficulty climbing stairs: Reports Difficulty getting out of chair: Reports Difficulty using hands for taps, buttons, cutlery, and/or writing:  Denies  Review of Systems  Constitutional: Negative for fatigue.  HENT: Negative for mouth sores, mouth dryness and nose dryness.   Eyes: Negative for pain, visual disturbance and dryness.  Respiratory: Negative for cough, hemoptysis, shortness of breath and difficulty breathing.   Cardiovascular: Negative for chest pain, palpitations, hypertension and swelling in legs/feet.  Gastrointestinal: Negative for blood in stool, constipation and diarrhea.  Endocrine: Negative for increased urination.  Genitourinary: Negative for painful urination.  Musculoskeletal: Negative for arthralgias, joint pain, joint swelling, myalgias, muscle weakness, morning stiffness, muscle tenderness and myalgias.  Skin: Negative for color change, pallor, rash, hair loss, nodules/bumps, skin tightness, ulcers and sensitivity to sunlight.  Allergic/Immunologic: Negative for susceptible to infections.  Neurological: Negative for dizziness, numbness, headaches and weakness.  Hematological: Negative for swollen glands.  Psychiatric/Behavioral: Negative for depressed mood and sleep disturbance. The patient is not nervous/anxious.     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   Psoriatic arthropathy (HCC) 04/17/2016   Psoriasis 04/17/2016   ANA positive 04/17/2016   Other fatigue 04/17/2016  DDD (degenerative disc disease), cervical 04/17/2016   DDD (degenerative disc disease), lumbar 04/17/2016   History of diabetes mellitus 04/17/2016   History of migraine 04/17/2016   Fatty liver 04/17/2016   Diabetes (HCC)    Depression    Fibromyalgia    Muscle cramps     Past Medical  History:  Diagnosis Date   Depression    Diabetes (HCC)    Fibromyalgia    Migraine    Muscle cramps     History reviewed. No pertinent family history. Past Surgical History:  Procedure Laterality Date   ABDOMINAL HYSTERECTOMY     CESAREAN SECTION     CHOLECYSTECTOMY     FACET JOINT INJECTION     GASTRIC BYPASS     SI Joint Injection     tummy tuck  03/24/2018   Social History   Social History Narrative   Patient lives at home alone with her husband and she works full time disability .    Right handed.   Caffeine one times per day.   Immunization History  Administered Date(s) Administered   Influenza Inj Mdck Quad Pf 02/13/2017   Influenza,inj,Quad PF,6+ Mos 01/23/2016, 01/27/2018   Influenza-Unspecified 01/23/2016   Pneumococcal Polysaccharide-23 01/20/2012   Tdap 07/28/2012     Objective: Vital Signs: BP 120/82 (BP Location: Left Arm, Patient Position: Sitting, Cuff Size: Normal)    Pulse 78    Resp 12    Ht 5\' 4"  (1.626 m)    Wt 138 lb (62.6 kg)    BMI 23.69 kg/m    Physical Exam Vitals signs and nursing note reviewed.  Constitutional:      Appearance: She is well-developed.  HENT:     Head: Normocephalic and atraumatic.  Eyes:     Conjunctiva/sclera: Conjunctivae normal.  Neck:     Musculoskeletal: Normal range of motion.  Cardiovascular:     Rate and Rhythm: Normal rate and regular rhythm.     Heart sounds: Normal heart sounds.  Pulmonary:     Effort: Pulmonary effort is normal.     Breath sounds: Normal breath sounds.  Abdominal:     General: Bowel sounds are normal.     Palpations: Abdomen is soft.  Lymphadenopathy:     Cervical: No cervical adenopathy.  Skin:    General: Skin is warm and dry.     Capillary Refill: Capillary refill takes less than 2 seconds.  Neurological:     Mental Status: She is alert and oriented to person, place, and time.  Psychiatric:        Behavior: Behavior normal.      Musculoskeletal Exam:  Generalized hyperalgesia and positive tender points on exam. C-spine limited range of motion with discomfort.  She is trapezius muscle tension and muscle tenderness bilaterally.  Thoracic and lumbar spine good range of motion.  No midline spinal tenderness.  Shoulder joints, elbow joints, wrist joints, MCPs, PIPs, DIPs good range of motion no synovitis.  She has complete fist formation bilaterally.  She has very mild PIP and DIP synovial thickening in both hands.  Hip joints have good range of motion.  She has tenderness over the left trochanteric bursa.  Right knee warmth and inflammation noted.  Left knee has good ROM with no warmth or effusion. Ankle joints have good ROM with no warmth or effusion.    CDAI Exam: CDAI Score: -- Patient Global: --; Provider Global: -- Swollen: --; Tender: -- Joint Exam   No joint exam has been documented  for this visit   There is currently no information documented on the homunculus. Go to the Rheumatology activity and complete the homunculus joint exam.  Investigation: No additional findings.  Imaging: No results found.  Recent Labs: Lab Results  Component Value Date   WBC 3.7 (L) 09/23/2018   HGB 10.9 (L) 09/23/2018   PLT 210 09/23/2018   NA 142 09/23/2018   K 3.7 09/23/2018   CL 109 09/23/2018   CO2 25 09/23/2018   GLUCOSE 78 09/23/2018   BUN 7 09/23/2018   CREATININE 0.54 09/23/2018   BILITOT 0.4 09/23/2018   ALKPHOS 113 10/29/2016   AST 22 09/23/2018   ALT 32 (H) 09/23/2018   PROT 6.5 09/23/2018   ALBUMIN 4.3 10/29/2016   CALCIUM 6.8 (L) 09/23/2018   GFRAA 132 09/23/2018   QFTBGOLDPLUS NEGATIVE 09/23/2018    Speciality Comments: PLQ Eye Exam: 12/29/16 WNL at [facility]. Follow up 1 year.   Procedures:  Trigger Point Inj  Date/Time: 11/11/2018 11:43 AM Performed by: Ofilia Neas, PA-C Authorized by: Ofilia Neas, PA-C   Consent Given by:  Patient Site marked: the procedure site was marked   Timeout: prior to  procedure the correct patient, procedure, and site was verified   Indications:  Pain Total # of Trigger Points:  2 Location: neck   Needle Size:  27 G Approach:  Dorsal Medications #1:  0.5 mL lidocaine 1 %; 10 mg triamcinolone acetonide 40 MG/ML Medications #2:  0.5 mL lidocaine 1 %; 10 mg triamcinolone acetonide 40 MG/ML Patient tolerance:  Patient tolerated the procedure well with no immediate complications   Allergies: Reclast [zoledronic acid], Duloxetine, Flunisolide, Vilazodone, Latex, and Tape   Assessment / Plan:     Visit Diagnoses: Psoriatic arthropathy ([place]) - She has no synovitis on exam.  She has not had any recent psoriatic arthritis flares.  She is clinically doing well on Enbrel 50 mg sq injections every week and Plaquenil 200 mg 1 tablet in the morning and 1/2 tablet in the evening Monday through Friday.  She presents today with left knee joint pain.  Ecchymosis noted but no erythema or joint effusion was apparent.  She denies any recent falls, but she believes she hit her knee on something recently.  She is currently having a fibromyalgia flare, but does not have any joint pain or joint swelling related to psoriatic arthritis.  She has no achilles tendonitis or plantar fasciitis.  No SI joint tenderness.  She has a few small patches of psoriasis scattered on her lower extremities.  A refill of clobetasol cream were sent to the pharmacy.  She will continue on Enbrel and PLQ as prescribed.  She does not need any refills at this time.  She was advised to notify us if she develops increased joint pain or joint swelling.  She will follow up in 5 months.     Psoriasis - She has noticed small patches of psoriasis on bilateral lower extremities for the past 4-5 days.  She requested a refill of clobetasol cream, which was sent to the pharmacy.   High risk medication use - Enbrel 50 mg subcutaneous injections every week and Plaquenil 1 tablet in the morning and half tablet in the evening  M-F. Last TB gold negative on 09/23/18.  CBC and CMP were drawn on 09/23/18.  She is due to update lab work in September and every 3 months. Standing orders are in place.  We discussed the importance of holding Enbrel  if she develops signs or symptoms of an infection and to resume once the infection is cleared.  We discussed importance of social distancing and following a standard precautions recommended by the CDC.  I also reminded her to get yearly skin exams while on Enbrel.  Fibromyalgia - She presents today having a fibromyalgia flare.  The flare is been going on for about 1 week.  She is been under increased stress while trying to switch jobs.  She is having generalized muscle aches muscle tenderness due to fibromyalgia.  She has trapezius muscle tension muscle tenderness bilaterally.  She has been experiencing muscle spasms in her neck and her lower back.  She requested bilateral trigger point injections today.  She tolerated procedure well. she has also had a migraine for the past several days.  She continues to take Cymbalta 20 mg 1 capsule by mouth every 3 days and flexeril PRN for muscle spasms.  She uses Lidoderm patches and Voltaren gel topically as needed for pain relief.  She requested a refill of Lidoderm patches.   She has been having worsening fatigue related to insomnia.  She has been experiencing nocturnal pain which is caused interrupted sleep at night.  We discussed the importance of regular exercise and good sleep hygiene.  She was given a work note stating that she was evaluated in the office today.  She was also given a prescription for a Varides[place], and she was encouraged to change positions frequently while working to help with the stiffness and discomfort she has been experiencing.  We discussed importance of proper posture.  We also discussed getting a TENS unit to help with her muscle tension and tenderness.  DDD (degenerative disc disease), cervical -She has limited ROM of the C-spine  with discomfort.  She has trapezius muscle tension and muscle tenderness bilaterally.  She has no symptoms of radiculopathy at this time.   DDD (degenerative disc disease), lumbar - Chronic pain   Other fatigue - She has chronic fatigue related to her insomnia.  Her fatigue has been worse over the past 1 week while having a fibromyalgia flare.   Trapezius muscle spasm - She has trapezius muscle tension and tenderness bilaterally.  She has been having intermittent muscle spasms.  She has a bilateral trigger point injections.  She tolerated procedure well.  The procedure notes were completed above.  She was encouraged to continues to use lidoderm patches as well as heating pad as needed.  Other medical conditions are listed as follows:   History of diabetes mellitus  Interstitial cystitis   History of asthma   History of depression  History of hypothyroidism   History of gastric bypass  History of prolonged Q-T interval  Orders: Orders Placed This Encounter  Procedures   Trigger Point Inj   Meds ordered this encounter  Medications   clobetasol cream (TEMOVATE) 0.05 %    Sig: Apply 1 application topically 2 (two) times daily.    Dispense:  30 g    Refill:  0   lidocaine (LIDODERM) 5 %    Sig: Place 1 patch onto the skin daily as needed (pain). Remove & Discard patch within 12 hours or as directed by MD    Dispense:  30 patch    Refill:  1    Face-to-face time spent with patient was 30 minutes. Greater than 50% of time was spent in counseling and coordination of care.  Follow-Up Instructions: Return in about 5 months (around 04/13/2019) for Psoriatic arthritis,  Fibromyalgia.   Gearldine Bienenstockaylor M Markian Glockner, PA-C  Note - This record has been created using Dragon software.  Chart creation errors have been sought, but may not always  have been located. Such creation errors do not reflect on  the standard of medical care.

## 2018-11-11 NOTE — Patient Instructions (Signed)
Standing Labs We placed an order today for your standing lab work.    Please come back and get your standing labs in September and every 3 months  We have open lab daily Monday through Thursday from 8:30-12:30 PM and 1:30-4:30 PM and Friday from 8:30-12:30 PM and 1:30 -4:00 PM at the office of Dr. Shaili Deveshwar.   You may experience shorter wait times on Monday and Friday afternoons. The office is located at [address], [address], [place], [place] [address] No appointment is necessary.   Labs are drawn by [facility].  You may receive a bill from [facility] for your lab work.  If you wish to have your labs drawn at another location, please call the office 24 hours in advance to send orders.  If you have any questions regarding directions or hours of operation,  please call [phone].   Just as a reminder please drink plenty of water prior to coming for your lab work. Thanks!  

## 2018-11-12 ENCOUNTER — Ambulatory Visit: Payer: Medicare Other | Admitting: Physician Assistant

## 2018-11-12 ENCOUNTER — Telehealth: Payer: Self-pay | Admitting: *Deleted

## 2018-11-12 NOTE — Telephone Encounter (Signed)
Received a Prior Authorization request from [facility] for Lidocaine patches . Authorization has been submitted to patient's insurance via Cover My Meds. Will update once we receive a response.

## 2018-11-15 NOTE — Telephone Encounter (Signed)
Received a fax regarding Prior Authorization from [facility] for Lidocaine patches . Authorization has been DENIED because coverage of the requested medication is provided for postherpetic neuralgia, neuropathic pain, low back pain and osteoarthritis .  Patient advised insurance denied coverage of Lidocaine patches and that she may use good rx coupon.

## 2018-11-16 ENCOUNTER — Telehealth: Payer: Self-pay | Admitting: *Deleted

## 2018-11-16 NOTE — Telephone Encounter (Signed)
Patient left a message stating that a prior authorization needs to be preformed in order for her insurance to pay for the Lidoderm patches.  Returned patient's call. Advised patient a prior authorization has already been preformed and the insurance denied the prior authorization.

## 2018-11-24 ENCOUNTER — Telehealth: Payer: Self-pay | Admitting: Rheumatology

## 2018-11-24 NOTE — Telephone Encounter (Signed)
Results sent to patient

## 2018-11-24 NOTE — Telephone Encounter (Signed)
Patient left a voicemail requesting a copy of her TB Gold test be mailed to her home address:     Michelle [name] [address] [place] [address]

## 2018-12-01 ENCOUNTER — Telehealth: Payer: Self-pay | Admitting: Rheumatology

## 2018-12-01 NOTE — Telephone Encounter (Signed)
Faxed TB Gold to Patient

## 2018-12-01 NOTE — Telephone Encounter (Signed)
Patient's husband requesting on behalf of patient, that her TB Gold results to be faxed to her work. Attn. Hulen Skains [phone]

## 2018-12-11 ENCOUNTER — Other Ambulatory Visit: Payer: Self-pay | Admitting: Rheumatology

## 2018-12-13 ENCOUNTER — Telehealth: Payer: Self-pay | Admitting: Rheumatology

## 2018-12-13 NOTE — Telephone Encounter (Signed)
Noted  

## 2018-12-13 NOTE — Telephone Encounter (Signed)
Patient left a voicemail to let Dr. Estanislado Pandy know that she is scheduled for her eye appointment on 01/03/19.

## 2018-12-13 NOTE — Telephone Encounter (Signed)
Last Visit: 11/11/18 Next Visit: 04/11/19 Labs: 09/23/18 WBC count is borderline low. RBC count, hgb, and hct are low. Calcium is extremely low. ALT borderline elevated Eye exam:  12/29/16 WNL   Patient states she was scheduled for an appointment in March when [place] first started and has not been able to reschedule. Patient will call to schedule an appointment.   Okay to refill Plaquenil?

## 2018-12-13 NOTE — Telephone Encounter (Signed)
Ok to refill 1 month supply but please advise patient to update PLQ eye exam ASAP.

## 2018-12-28 ENCOUNTER — Other Ambulatory Visit: Payer: Self-pay | Admitting: Rheumatology

## 2018-12-28 NOTE — Telephone Encounter (Addendum)
Last Visit: 11/11/18 Next Visit: 04/11/19 Labs: 09/23/18 WBC count is borderline low. RBC count, hgb, and hct are low. Calcium is extremely low. ALT borderline elevated Tb Gold: 09/23/18 Neg   Patient advised she is due for labs. Patient will update this week or beginning of next week.   Okay to refill 30 day supply per Dr. Estanislado Pandy

## 2019-01-03 LAB — HM DIABETES EYE EXAM

## 2019-01-10 ENCOUNTER — Other Ambulatory Visit: Payer: Self-pay | Admitting: Rheumatology

## 2019-01-10 NOTE — Telephone Encounter (Signed)
Last Visit: 11/11/18 Next Visit: 04/11/19 Labs: 09/23/2018 WBC count is borderline low. RBC count, hgb, and hct are low. Calcium is extremely low. ALT borderline elevated.  Eye exam: 12/29/2016   Left message for patient to advise we need updated PLQ eye exam.  Okay to refill 30 day supply PLQ?

## 2019-01-10 NOTE — Telephone Encounter (Signed)
ok 

## 2019-01-11 NOTE — Telephone Encounter (Signed)
CBC and CMP WNL

## 2019-01-12 LAB — QUANTIFERON-TB GOLD PLUS
Mitogen-NIL: >10 IU/mL
NIL: 0.02 IU/mL
QuantiFERON-TB Gold Plus: NEGATIVE
TB1-NIL: 0.03 IU/mL
TB2-NIL: 0.05 IU/mL

## 2019-01-12 LAB — CBC WITH DIFFERENTIAL/PLATELET
Absolute Monocytes: 487 cells/uL (ref 200–950)
Basophils Absolute: 39 cells/uL (ref 0–200)
Basophils Relative: 0.7 %
Eosinophils Absolute: 297 cells/uL (ref 15–500)
Eosinophils Relative: 5.3 %
HCT: 36.9 % (ref 35.0–45.0)
Hemoglobin: 12.5 g/dL (ref 11.7–15.5)
Lymphs Abs: 2498 cells/uL (ref 850–3900)
MCH: 30.7 pg (ref 27.0–33.0)
MCHC: 33.9 g/dL (ref 32.0–36.0)
MCV: 90.7 fL (ref 80.0–100.0)
MPV: 10.8 fL (ref 7.5–12.5)
Monocytes Relative: 8.7 %
Neutro Abs: 2279 cells/uL (ref 1500–7800)
Neutrophils Relative %: 40.7 %
Platelets: 188 10*3/uL (ref 140–400)
RBC: 4.07 10*6/uL (ref 3.80–5.10)
RDW: 13.1 % (ref 11.0–15.0)
Total Lymphocyte: 44.6 %
WBC: 5.6 10*3/uL (ref 3.8–10.8)

## 2019-01-12 LAB — COMPLETE METABOLIC PANEL WITH GFR
AG Ratio: 1.7 (calc) (ref 1.0–2.5)
ALT: 20 U/L (ref 6–29)
AST: 20 U/L (ref 10–35)
Albumin: 4.3 g/dL (ref 3.6–5.1)
Alkaline phosphatase (APISO): 62 U/L (ref 31–125)
BUN/Creatinine Ratio: 8 (calc) (ref 6–22)
BUN: 5 mg/dL — ABNORMAL LOW (ref 7–25)
CO2: 27 mmol/L (ref 20–32)
Calcium: 9.1 mg/dL (ref 8.6–10.2)
Chloride: 102 mmol/L (ref 98–110)
Creat: 0.6 mg/dL (ref 0.50–1.10)
GFR, Est African American: 128 mL/min/{1.73_m2} (ref >=60)
GFR, Est Non African American: 110 mL/min/{1.73_m2} (ref >=60)
Globulin: 2.5 g/dL (calc) (ref 1.9–3.7)
Glucose, Bld: 90 mg/dL (ref 65–99)
Potassium: 4.5 mmol/L (ref 3.5–5.3)
Sodium: 138 mmol/L (ref 135–146)
Total Bilirubin: 0.3 mg/dL (ref 0.2–1.2)
Total Protein: 6.8 g/dL (ref 6.1–8.1)

## 2019-01-12 NOTE — Telephone Encounter (Signed)
TB gold is negative.

## 2019-01-20 ENCOUNTER — Other Ambulatory Visit: Payer: Self-pay | Admitting: *Deleted

## 2019-01-20 MED ORDER — ENBREL SURECLICK 50 MG/ML ~~LOC~~ SOAJ: 50.0000 mg | SUBCUTANEOUS | 0 refills | Status: DC

## 2019-01-20 NOTE — Telephone Encounter (Signed)
Refill request received via fax  Last Visit:11/11/18 Next Visit:04/11/19 Labs: 01/10/19 WNL Tb Gold: 01/10/19 Neg   Okay to refill per Dr. Estanislado Pandy

## 2019-02-07 ENCOUNTER — Other Ambulatory Visit: Payer: Self-pay | Admitting: Rheumatology

## 2019-02-07 NOTE — Telephone Encounter (Signed)
Last Visit:11/11/18 Next Visit:04/11/19 Labs:6/4/20WBC count is borderline low. RBC count, hgb, and hct are low. Calcium is extremely low. ALT borderline elevated. Eye exam:12/29/2016   Patient states she is scheduled for her PLQ eye exam on 02/17/19.   Okay to refill 30 day supply PLQ?

## 2019-02-07 NOTE — Telephone Encounter (Signed)
Ok to refill 30 day supply of PLQ.

## 2019-02-17 LAB — HM DIABETES EYE EXAM

## 2019-03-04 ENCOUNTER — Other Ambulatory Visit: Payer: Self-pay | Admitting: Rheumatology

## 2019-03-07 NOTE — Telephone Encounter (Addendum)
Last Visit: 11/11/18 Next Visit: 04/11/19 Labs:01/10/19 WNL Eye exam:12/29/2016   Left message to advise patient we need update PLQ eye exam.

## 2019-03-28 NOTE — Progress Notes (Signed)
Virtual Visit via Telephone Note  I connected with Michelle [name] on 03/29/19 at  8:15 AM EST by telephone and verified that I am speaking with the correct person using two identifiers.  Location: Patient: Home  Provider: Clinic  This service was conducted via virtual visit.  The patient was located at home. I was located in my office.  Consent was obtained prior to the virtual visit and is aware of possible charges through their insurance for this visit.  The patient is an established patient.  Dr. Estanislado Pandy, MD conducted the virtual visit and Hazel Sams, PA-C acted as scribe during the service.  Office staff helped with scheduling follow up visits after the service was conducted.   I discussed the limitations, risks, security and privacy concerns of performing an evaluation and management service by telephone and the availability of in person appointments. I also discussed with the patient that there may be a patient responsible charge related to this service. The patient expressed understanding and agreed to proceed.  CC: Generalized pain  History of Present Illness: Patient is a 43 year old female with a past medical history of psoriatic arthritis and fibromyalgia. She is on Enbrel 50 mg sq injections once weekly and Plaquenil 200 mg 1 tablet in the morning and half tablet in the evening.  She states she has been having increased joint stiffness recently.  She has not noticed much joint inflammation.  She is unsure if enbrel is as effective as it was previously.  She states she is currently having a fibromyalgia flare.  She is having generalized muscle tenderness and hypersensitivity. She is taking cymbalta 20 mg every other day.    Review of Systems  Constitutional: Positive for malaise/fatigue. Negative for fever.  Eyes: Negative for photophobia, pain, discharge and redness.  Respiratory: Negative for cough, shortness of breath and wheezing.   Cardiovascular: Negative for chest pain and  palpitations.  Gastrointestinal: Negative for blood in stool, constipation and diarrhea.  Genitourinary: Negative for dysuria.  Musculoskeletal: Positive for back pain, joint pain, myalgias and neck pain.       +Joint stiffness   Skin: Negative for rash.  Neurological: Negative for dizziness and headaches.  Psychiatric/Behavioral: Positive for depression. The patient has insomnia. The patient is not nervous/anxious.       Observations/Objective: Physical Exam  Constitutional: She is oriented to person, place, and time.  Neurological: She is alert and oriented to person, place, and time.  Psychiatric: Mood, memory, affect and judgment normal.   Patient reports morning stiffness for   several hours.   Patient reports nocturnal pain.  Difficulty dressing/grooming: Denies Difficulty climbing stairs: Reports Difficulty getting out of chair: Reports Difficulty using hands for taps, buttons, cutlery, and/or writing: Reports   Assessment and Plan: Visit Diagnoses: Psoriatic arthropathy ([place]) - She has been experiencing increased morning stiffness recently.  She has been having increased generalized arthralgias and myalgias. She has not noticed any obvious joint inflammation. She is currently having a fibromyalgia flare.  She is on Enbrel 50 mg sq injections once weekly and plaquenil 200 mg 1 tablet in the morning and 1/2 tablet in the evening.  She would like to restart on MTX.  She previously used to experience headaches with tablets of MTX.  She would like to try the injectable form of MTX.  She will start on MTX 0.4 ml sq once weekly for 2 weeks and if labs are stable at that time she will increase to 0.6 ml sq once  weekly. She will take folic acid 1 mg po daily. She can discontinue PLQ 2 weeks after starting MTX.  A future order for ESR will be checked with the lab work in 2 weeks.  She was advised to notify us if she cannot tolerate MTX.  She will follow up in 2-3 months.   Psoriasis - She  has no psoriasis at this time.   High risk medication use - Enbrel 50 mg subcutaneous injections every week and Plaquenil 1 tablet in the morning and half tablet in the evening. CBC and CMP stable on 01/10/19. She is due to update CBC and CMP this month. We will add a future order for ESR to her lab orders. TB gold negative on 01/10/19.   Fibromyalgia - She is currently having a fibromyalgia flare. She is having generalized muscle aches and muscle tenderness. She has also had hypersensitivity of her skin.  She states wearing clothes feel like sandpaper.  She is having trapezius muscle tension and tenderness.  She has also been having increased lower back pain.  She is taking Cymbalta 20 mg every other day.  She is unable to increase the dose due to it exacerbating IC.  She is taking oxycodone for pain relief.  DDD (degenerative disc disease), cervical -She has been having increased neck pain and stiffness.  She has been experiencing trapezius muscle tension and tenderness bilaterally.    DDD (degenerative disc disease), lumbar - Chronic pain.  She has been having increased lower back pain.  She is taking oxycodone for pain relief.   Other fatigue - She has chronic fatigue that has been severe recently due to having a fibromyalgia flare. Good sleep hygiene was discussed.    Trapezius muscle spasm - She has trapezius muscle tension and tenderness bilaterally.    Other medical conditions are listed as follows:   History of diabetes mellitus  Interstitial cystitis   History of asthma   History of depression  History of hypothyroidism   History of gastric bypass  History of prolonged Q-T interval   Follow Up Instructions: She will follow up in 2-3 months.    I discussed the assessment and treatment plan with the patient. The patient was provided an opportunity to ask questions and all were answered. The patient agreed with the plan and demonstrated an understanding of the  instructions.   The patient was advised to call back or seek an in-person evaluation if the symptoms worsen or if the condition fails to improve as anticipated.  I provided 25 minutes of non-face-to-face time during this encounter.   Bo Merino, MD   Scribed by-  Hazel Sams, PA-C

## 2019-03-29 ENCOUNTER — Other Ambulatory Visit: Payer: Self-pay

## 2019-03-29 ENCOUNTER — Telehealth (INDEPENDENT_AMBULATORY_CARE_PROVIDER_SITE_OTHER): Payer: BC Managed Care – PPO | Admitting: Rheumatology

## 2019-03-29 ENCOUNTER — Encounter: Payer: Self-pay | Admitting: Rheumatology

## 2019-03-29 DIAGNOSIS — L409 Psoriasis, unspecified: Secondary | ICD-10-CM

## 2019-03-29 DIAGNOSIS — Z9884 Bariatric surgery status: Secondary | ICD-10-CM

## 2019-03-29 DIAGNOSIS — M62838 Other muscle spasm: Secondary | ICD-10-CM | POA: Diagnosis not present

## 2019-03-29 DIAGNOSIS — L4050 Arthropathic psoriasis, unspecified: Secondary | ICD-10-CM | POA: Diagnosis not present

## 2019-03-29 DIAGNOSIS — M5030 Other cervical disc degeneration, unspecified cervical region: Secondary | ICD-10-CM

## 2019-03-29 DIAGNOSIS — R5383 Other fatigue: Secondary | ICD-10-CM | POA: Diagnosis not present

## 2019-03-29 DIAGNOSIS — Z8639 Personal history of other endocrine, nutritional and metabolic disease: Secondary | ICD-10-CM

## 2019-03-29 DIAGNOSIS — M5136 Other intervertebral disc degeneration, lumbar region: Secondary | ICD-10-CM

## 2019-03-29 DIAGNOSIS — Z87898 Personal history of other specified conditions: Secondary | ICD-10-CM

## 2019-03-29 DIAGNOSIS — M797 Fibromyalgia: Secondary | ICD-10-CM | POA: Diagnosis not present

## 2019-03-29 DIAGNOSIS — Z8659 Personal history of other mental and behavioral disorders: Secondary | ICD-10-CM

## 2019-03-29 DIAGNOSIS — N3010 Interstitial cystitis (chronic) without hematuria: Secondary | ICD-10-CM

## 2019-03-29 DIAGNOSIS — Z79899 Other long term (current) drug therapy: Secondary | ICD-10-CM

## 2019-03-29 DIAGNOSIS — Z8709 Personal history of other diseases of the respiratory system: Secondary | ICD-10-CM

## 2019-03-29 NOTE — Patient Instructions (Signed)
Standing Labs We placed an order today for your standing lab work.    Please come back and get your standing labs in 2 weeks x2, 2 months and then every 3 months.   We have open lab daily Monday through Thursday from 8:30-12:30 PM and 1:30-4:30 PM and Friday from 8:30-12:30 PM and 1:30-4:00 PM at the office of Dr. [name].   You may experience shorter wait times on Monday and Friday afternoons. The office is located at [address], [place], [place] [address] No appointment is necessary.   Labs are drawn by [facility].  You may receive a bill from [facility] for your lab work.  If you wish to have your labs drawn at another location, please call the office 24 hours in advance to send orders.  If you have any questions regarding directions or hours of operation,  please call [phone].   Just as a reminder please drink plenty of water prior to coming for your lab work. Thanks!   Methotrexate subcutaneous injection What is this medicine? METHOTREXATE (METH oh TREX ate) is a cytotoxic drug that also suppresses the immune system. It is used to treat psoriasis and rheumatoid arthritis. This medicine may be used for other purposes; ask your health care provider or pharmacist if you have questions. COMMON BRAND NAME(S): Otrexup, Rasuvo What should I tell my health care provider before I take this medicine? They need to know if you have any of these conditions:  fluid in the stomach area or lungs  if you often drink alcohol  infection or immune system problems  kidney disease  liver disease  low blood counts, like low white cell, platelet, or red cell counts  lung disease  radiation therapy  stomach ulcers  ulcerative colitis  an unusual or allergic reaction to methotrexate, other medicines, foods, dyes, or preservatives  pregnant or trying to get pregnant  breast-feeding How should I use this medicine? This medicine is for injection under the skin. You  will be taught how to prepare and give this medicine. Refer to the Instructions for Use that come with your medication packaging. Use exactly as directed. Take your medicine at regular intervals. Do not take your medicine more often than directed. This medicine should be taken weekly, NOT daily. It is important that you put your used needles and syringes in a special sharps container. Do not put them in a trash can. If you do not have a sharps container, call your pharmacist of healthcare provider to get one. Talk to your pediatrician regarding the use of this medicine in children. While this drug may be prescribed for children as young as 2 years for selected conditions, precautions do apply. Overdosage: If you think you have taken too much of this medicine contact a poison control center or emergency room at once. NOTE: This medicine is only for you. Do not share this medicine with others. What if I miss a dose? If you are not sure if this medicine was injected or if you have a hard time giving the injection, do not inject another dose. Talk with your doctor or health care professional. What may interact with this medicine? This medicine may interact with the following medications:  acitretin  aspirin or aspirin-like medicines including salicylates  azathioprine  certain antibiotics like chloramphenicol, penicillin, tetracycline  cyclosporine  gold  hydroxychloroquine  live virus vaccines  mercaptopurine  NSAIDs, medicines for pain and inflammation, like ibuprofen or naproxen  other cytotoxic agents  penicillamine  phenylbutazone  phenytoin  probenacid  retinoids such as isotretinoin and tretinoin  steroid medicines like prednisone or cortisone  sulfonamides like sulfasalazine and trimethoprim/sulfamethoxazole  theophylline This list may not describe all possible interactions. Give your health care provider a list of all the medicines, herbs, non-prescription drugs,  or dietary supplements you use. Also tell them if you smoke, drink alcohol, or use illegal drugs. Some items may interact with your medicine. What should I watch for while using this medicine? Avoid alcoholic drinks. This medicine can make you more sensitive to the sun. Keep out of the sun. If you cannot avoid being in the sun, wear protective clothing and use sunscreen. Do not use sun lamps or tanning beds/booths. You may get drowsy or dizzy. Do not drive, use machinery, or do anything that needs mental alertness until you know how this medicine affects you. Do not stand or sit up quickly, especially if you are an older patient. This reduces the risk of dizzy or fainting spells. You may need blood work done while you are taking this medicine. Call your doctor or health care professional for advice if you get a fever, chills or sore throat, or other symptoms of a cold or flu. Do not treat yourself. This drug decreases your body's ability to fight infections. Try to avoid being around people who are sick. This medicine may increase your risk to bruise or bleed. Call your doctor or health care professional if you notice any unusual bleeding. Check with your doctor or health care professional if you get an attack of severe diarrhea, nausea and vomiting, or if you sweat a lot. The loss of too much body fluid can make it dangerous for you to take this medicine. Talk to your doctor about your risk of cancer. You may be more at risk for certain types of cancers if you take this medicine. Both men and women must use effective birth control with this medicine. Do not become pregnant while taking this medicine or until at least 1 normal menstrual cycle has occurred after stopping it. Women should inform their doctor if they wish to become pregnant or think they might be pregnant. Men should not father a child while taking this medicine and for 3 months after stopping it. There is a potential for serious side effects  to an unborn child. Talk to your health care professional or pharmacist for more information. Do not breast-feed an infant while taking this medicine. What side effects may I notice from receiving this medicine? Side effects that you should report to your doctor or health care professional as soon as possible:  allergic reactions like skin rash, itching or hives, swelling of the face, lips, or tongue  breathing problems or shortness of breath  diarrhea  dry, nonproductive cough  low blood counts - this medicine may decrease the number of white blood cells, red blood cells and platelets. You may be at increased risk of infections and bleeding  mouth sores  redness, blistering, peeling or loosening of the skin, including inside the mouth  signs of infection - fever or chills, cough, sore throat, pain or difficulty passing urine  signs and symptoms of bleeding such as bloody or black, tarry stools; red or dark-brown urine; spitting up blood or brown material that looks like coffee grounds; red spots on the skin; unusual bruising or bleeding from the eye, gums, or nose  signs and symptoms of kidney injury like trouble passing urine or change in the amount of  urine  signs and symptoms of liver injury like dark yellow or brown urine; general ill feeling or flu-like symptoms; light-colored stools; loss of appetite; nausea; right upper belly pain; unusually weak or tired; yellowing of the eyes or skin Side effects that usually do not require medical attention (report to your doctor or health care professional if they continue or are bothersome):  dizziness  hair loss  headache  stomach pain  upset stomach  vomiting This list may not describe all possible side effects. Call your doctor for medical advice about side effects. You may report side effects to FDA at 1-800-FDA-1088. Where should I keep my medicine? Keep out of the reach of children. You will be instructed on how to store  this medicine. Throw away any unused medicine after the expiration date on the label. NOTE: This sheet is a summary. It may not cover all possible information. If you have questions about this medicine, talk to your doctor, pharmacist, or health care provider.  2020 Elsevier/Gold Standard (2016-11-27 13:37:10)

## 2019-03-30 ENCOUNTER — Telehealth: Payer: Self-pay | Admitting: *Deleted

## 2019-03-30 ENCOUNTER — Telehealth: Payer: Self-pay | Admitting: Rheumatology

## 2019-03-30 NOTE — Telephone Encounter (Signed)
Attempted to contact patient and left message on machine to advise patient to call the office.  

## 2019-03-30 NOTE — Telephone Encounter (Signed)
After consent, MTX 0.4 ml sq wk x 2 wk, then increase to 0.6, prescription vial and syringe, MTX, folic acid 1 mg daily.

## 2019-03-30 NOTE — Telephone Encounter (Signed)
Per patient, she does not need the injection training. Patient states she is a diabetic and is familiar with vial and syringe injections. Patient will come by the office today to sign consent form.

## 2019-03-30 NOTE — Telephone Encounter (Signed)
Patient was told to call office, and find out when the best time would be to come in to be shown how to inject MTX today. Please call to advise.

## 2019-03-31 MED ORDER — METHOTREXATE SODIUM CHEMO INJECTION 50 MG/2ML: INTRAMUSCULAR | 0 refills | Status: DC

## 2019-03-31 MED ORDER — "TUBERCULIN SYRINGE 27G X 1/2"" 1 ML MISC": 2 refills | Status: DC

## 2019-03-31 MED ORDER — FOLIC ACID 1 MG PO TABS: 1.0000 mg | ORAL_TABLET | Freq: Every day | ORAL | 2 refills | Status: DC

## 2019-03-31 NOTE — Telephone Encounter (Signed)
Patient signed MTX consent in the office on 03/30/2019.   MTX, folic acid and syringes have been sent to the pharmacy ([facility], per patients request).

## 2019-03-31 NOTE — Addendum Note (Signed)
Addended by: Earnestine Mealing on: 03/31/2019 09:36 AM   Modules accepted: Orders

## 2019-04-01 ENCOUNTER — Other Ambulatory Visit: Payer: Self-pay | Admitting: Rheumatology

## 2019-04-01 DIAGNOSIS — L4050 Arthropathic psoriasis, unspecified: Secondary | ICD-10-CM

## 2019-04-04 NOTE — Telephone Encounter (Signed)
Last Visit: 03/29/2019 telemedicine  Next Visit: 06/20/2019 Labs: 01/10/2019 CBC and CMP WNL Eye exam: 12/29/16  Per note on 03/29/2019 "She can discontinue PLQ 2 weeks after starting MTX." will send in refill for a 2 week supply.   Okay to refill per Dr. Estanislado Pandy.

## 2019-04-06 ENCOUNTER — Other Ambulatory Visit: Payer: Self-pay | Admitting: Rheumatology

## 2019-04-06 NOTE — Telephone Encounter (Signed)
Last Visit: 03/29/2019 telemedicine  Next Visit: 06/20/2019 Labs: 01/10/2019 CBC and CMP WNL TB Gold: 01/10/2019 negative   Okay to refill per Dr. Estanislado Pandy.

## 2019-04-11 ENCOUNTER — Telehealth: Payer: Medicare Other | Admitting: Physician Assistant

## 2019-04-21 ENCOUNTER — Other Ambulatory Visit: Payer: Self-pay | Admitting: Rheumatology

## 2019-04-21 DIAGNOSIS — L4050 Arthropathic psoriasis, unspecified: Secondary | ICD-10-CM

## 2019-05-04 ENCOUNTER — Other Ambulatory Visit: Payer: Self-pay | Admitting: Rheumatology

## 2019-05-04 DIAGNOSIS — L4050 Arthropathic psoriasis, unspecified: Secondary | ICD-10-CM

## 2019-05-04 DIAGNOSIS — Z79899 Other long term (current) drug therapy: Secondary | ICD-10-CM

## 2019-05-04 NOTE — Telephone Encounter (Signed)
Lab orders released for [facility].   Patient has been injecting methotrexate 0.9mL once weekly and has not increased dose to 0.21mL once weekly. Patient has not updated labs since starting methotrexate. I advised patient we need labs done prior to increasing the dose. Patient verbalized understanding.

## 2019-05-04 NOTE — Telephone Encounter (Signed)
Patient going to [facility] in [place] for labs. Please release orders. Patient also will be due for MTX on Saturday. Patient has used all of lower dose, and request refill to be called into [facility].

## 2019-05-06 ENCOUNTER — Telehealth: Payer: Self-pay | Admitting: Rheumatology

## 2019-05-06 NOTE — Telephone Encounter (Signed)
Patient called stating she went to [facility] today and was told she has an outstanding balance.  Patient states she has 2 insurances and usually doesn't have a bill.  Patient states she was told "we can charge whatever we want."  Patient is requesting her labwork orders be faxed to her PCP Dr. Cyndia Diver.  Fax #[phone]  Patient states she will be going tomorrow, 05/07/19.

## 2019-05-06 NOTE — Telephone Encounter (Signed)
Lab orders faxed to Dr.McFadden's office at number provided by patient.

## 2019-05-12 NOTE — Telephone Encounter (Signed)
Patient reminded she still needs labs. Patient states she will be getting her labs this evening.

## 2019-05-16 NOTE — Telephone Encounter (Signed)
Spoke with patient and she states she had her lab work completed 05/13/19. Patient states she will call and have them faxed over.

## 2019-05-17 MED ORDER — METHOTREXATE SODIUM CHEMO INJECTION 50 MG/2ML: 15.0000 mg | INTRAMUSCULAR | 0 refills | Status: DC

## 2019-05-17 NOTE — Addendum Note (Signed)
Addended by: Henriette Combs on: 05/17/2019 09:15 AM   Modules accepted: Orders

## 2019-05-17 NOTE — Telephone Encounter (Signed)
Last Visit: 03/29/19 Next Visit: 06/20/19 Labs: 05/12/19 CO2 31, BUN 6 Glucose 122, Calcium 8.4, RBC 3.57, Hgb 11.4, Hct 32.9  Okay to increase MTX to 0.6 mL weekly?

## 2019-05-17 NOTE — Telephone Encounter (Signed)
Labs reviewed in care everywhere. Labs have been stable. Ok to increase MTX to 0.6 ml once weekly.

## 2019-06-14 NOTE — Progress Notes (Deleted)
Office Visit Note  Patient: Michelle [name]             Date of Birth: 04/02/1976           MRN: [id number]             PCP: Lester Oaks., MD Referring: Lester ., MD Visit Date: 06/20/2019 Occupation: @GUAROCC @  Subjective:  No chief complaint on file.   History of Present Illness: Michelle [name] is a 44 y.o. female ***   Activities of Daily Living:  Patient reports morning stiffness for *** {minute/hour:19697}.   Patient {ACTIONS;DENIES/REPORTS:21021675::"Denies"} nocturnal pain.  Difficulty dressing/grooming: {ACTIONS;DENIES/REPORTS:21021675::"Denies"} Difficulty climbing stairs: {ACTIONS;DENIES/REPORTS:21021675::"Denies"} Difficulty getting out of chair: {ACTIONS;DENIES/REPORTS:21021675::"Denies"} Difficulty using hands for taps, buttons, cutlery, and/or writing: {ACTIONS;DENIES/REPORTS:21021675::"Denies"}  No Rheumatology ROS completed.   PMFS History:  Patient Active Problem List   Diagnosis Date Noted  . Interstitial cystitis 11/24/2016  . High risk medication use 11/02/2016  . History of depression 11/02/2016  . History of asthma 11/02/2016  . Prolonged QT interval 11/02/2016  . History of gastric bypass 11/02/2016  . History of hypothyroidism 11/02/2016  . History of renal calcinosis 11/02/2016  . Psoriatic arthropathy ([facility]) 04/17/2016  . Psoriasis 04/17/2016  . ANA positive 04/17/2016  . Other fatigue 04/17/2016  . DDD (degenerative disc disease), cervical 04/17/2016  . DDD (degenerative disc disease), lumbar 04/17/2016  . History of diabetes mellitus 04/17/2016  . History of migraine 04/17/2016  . Fatty liver 04/17/2016  . Diabetes ([facility])   . Depression   . Fibromyalgia   . Muscle cramps     Past Medical History:  Diagnosis Date  . Depression   . Diabetes ([facility])   . Fibromyalgia   . Migraine   . Muscle cramps     No family history on file. Past Surgical History:  Procedure Laterality Date  . ABDOMINAL HYSTERECTOMY    . CESAREAN  SECTION    . CHOLECYSTECTOMY    . FACET JOINT INJECTION    . GASTRIC BYPASS    . SI Joint Injection    . tummy tuck  03/24/2018   Social History   Social History Narrative   Patient lives at home alone with her husband and she works full time disability .    Right handed.   Caffeine one times per day.   Immunization History  Administered Date(s) Administered  . Influenza Inj Mdck Quad Pf 02/13/2017  . Influenza,inj,Quad PF,6+ Mos 01/23/2016, 01/27/2018  . Influenza-Unspecified 01/23/2016  . Pneumococcal Polysaccharide-23 01/20/2012  . Tdap 07/28/2012     Objective: Vital Signs: There were no vitals taken for this visit.   Physical Exam   Musculoskeletal Exam: ***  CDAI Exam: CDAI Score: -- Patient Global: --; Provider Global: -- Swollen: --; Tender: -- Joint Exam 06/20/2019   No joint exam has been documented for this visit   There is currently no information documented on the homunculus. Go to the Rheumatology activity and complete the homunculus joint exam.  Investigation: No additional findings.  Imaging: No results found.  Recent Labs: Lab Results  Component Value Date   WBC 5.6 01/10/2019   HGB 12.5 01/10/2019   PLT 188 01/10/2019   NA 138 01/10/2019   K 4.5 01/10/2019   CL 102 01/10/2019   CO2 27 01/10/2019   GLUCOSE 90 01/10/2019   BUN 5 (L) 01/10/2019   CREATININE 0.60 01/10/2019   BILITOT 0.3 01/10/2019   ALKPHOS 113 10/29/2016   AST 20 01/10/2019   ALT 20 01/10/2019  PROT 6.8 01/10/2019   ALBUMIN 4.3 10/29/2016   CALCIUM 9.1 01/10/2019   GFRAA 128 01/10/2019   QFTBGOLDPLUS NEGATIVE 01/10/2019    Speciality Comments: PLQ Eye Exam: 12/29/16 WNL at [facility]. Follow up 1 year.   Procedures:  No procedures performed Allergies: Reclast [zoledronic acid], Duloxetine, Flunisolide, Vilazodone, Latex, and Tape   Assessment / Plan:     Visit Diagnoses: No diagnosis found.  Orders: No orders of the defined types were placed in  this encounter.  No orders of the defined types were placed in this encounter.   Face-to-face time spent with patient was *** minutes. Greater than 50% of time was spent in counseling and coordination of care.  Follow-Up Instructions: No follow-ups on file.   Earnestine Mealing, CMA  Note - This record has been created using Editor, commissioning.  Chart creation errors have been sought, but may not always  have been located. Such creation errors do not reflect on  the standard of medical care.

## 2019-06-20 ENCOUNTER — Ambulatory Visit: Payer: Medicare Other | Admitting: Physician Assistant

## 2019-06-20 NOTE — Progress Notes (Signed)
Virtual Visit via Telephone Note  I connected with Michelle [name] on 06/23/19 at  4:00 PM EST by a video enabled telemedicine application and verified that I am speaking with the correct person using two identifiers.  Location: Patient: Home Provider: Clinic  This service was conducted via virtual visit.  The patient was located at home. I was located in my office.  Consent was obtained prior to the virtual visit and is aware of possible charges through their insurance for this visit.  The patient is an established patient.  Dr. Corliss Skains, MD conducted the virtual visit and Sherron Ales, PA-C acted as scribe during the service.  Office staff helped with scheduling follow up visits after the service was conducted.     I discussed the limitations of evaluation and management by telemedicine and the availability of in person appointments. The patient expressed understanding and agreed to proceed.  CC: Joint stiffness  History of Present Illness: Patient is a 44 year old female with a past medical history of psoriatic arthritis and fibromyalgia. She is on Enbrel 50 mg sq injections once weekly and MTX 0.6 ml sq once weekly.  She discontinued PLQ after starting on MTX in December 2020.  She states she found PLQ to be more effective than MTX and would like to restart on Plaquenil.  She states her joint stiffness has been lasting all day. She states she is having pain and swelling in both hands, both wrist joints, and both ankle joints.  She continues have generalized muscle aches muscle tenderness due to fibromyalgia.  She continues have chronic fatigue but has been sleeping better overall.  Her anxiety and depression is also been better managed recently.  Review of Systems  Constitutional: Positive for malaise/fatigue. Negative for fever.  Eyes: Negative for photophobia, pain, discharge and redness.  Respiratory: Negative for cough, shortness of breath and wheezing.   Cardiovascular: Negative for chest  pain and palpitations.  Gastrointestinal: Negative for blood in stool, constipation and diarrhea.  Genitourinary: Negative for dysuria.  Musculoskeletal: Positive for joint pain and myalgias. Negative for back pain and neck pain.       +Joint stiffness   Skin: Negative for rash.  Neurological: Negative for dizziness and headaches.  Psychiatric/Behavioral: Negative for depression. The patient is not nervous/anxious and does not have insomnia.       Observations/Objective: Physical Exam  Constitutional: She is oriented to person, place, and time and well-developed, well-nourished, and in no distress.  HENT:  Head: Normocephalic and atraumatic.  Eyes: Conjunctivae are normal.  Pulmonary/Chest: Effort normal.  Neurological: She is alert and oriented to person, place, and time.  Psychiatric: Mood, memory, affect and judgment normal.   Patient reports morning stiffness for 1 hour.   Patient reports nocturnal pain.  Difficulty dressing/grooming: Denies Difficulty climbing stairs: Reports Difficulty getting out of chair: Reports Difficulty using hands for taps, buttons, cutlery, and/or writing: Reports   Assessment and Plan: Visit Diagnoses:Psoriatic arthropathy (HCC)- She has been experiencing increased joint stiffness and arthralgias over the past several months.  She is on Enbrel 50 mg subcutaneous injections once weekly and has been taking methotrexate 6 tablets by mouth once weekly since December 2020.  She discontinued Plaquenil after starting on methotrexate and feels that her joint pain and stiffness has worsened.  She would like to restart on Plaquenil 200 mg 1 tablet a morning half tablet in the evening and discontinue methotrexate.  If she does not notice clinical improvement after 1 month of starting on  Plaquenil she will restart on methotrexate in combination with Enbrel and Plaquenil.  She was advised to notify us if she continues to have recurrent flares.  She will follow-up in  the office in 3 months.  Psoriasis -She has no psoriasis at this time.   High risk medication use - Enbrel 50 mg subcutaneous injections every week and methotrexate 6 tablets by mouth once weekly (started in December 2020 and will be discontinuing in March 2021)  She will be restarting on Plaquenil 200 mg 1 tablet a morning half tablet in the evening.  CBC and CMP were drawn on 05/13/2019.  She will be due to update lab work in April and every 3 months.  TB gold negative on 05/13/2019.  DDD (degenerative disc disease), cervical-She has chronic neck pain and stiffness.  No symptoms of radiculopathy.  She has trapezius muscle tension and muscle tenderness bilaterally.  We discussed the importance of performing neck exercises.  DDD (degenerative disc disease), lumbar -Chronic pain.    Other fatigue -She has chronic fatigue which has been stable overall.  She has been sleeping better at night recently.  Trapezius muscle spasm -She has ongoing trapezius muscle tension and muscle tenderness bilaterally.  We discussed the importance of correct posture and performing stretching exercises.  Other medical conditions are listed as follows:  History of diabetes mellitus  Interstitial cystitis   History of asthma   History of depression  History of hypothyroidism   History of gastric bypass  History of prolonged Q-T interval  Follow Up Instructions: She will follow up in 3 months   I discussed the assessment and treatment plan with the patient. The patient was provided an opportunity to ask questions and all were answered. The patient agreed with the plan and demonstrated an understanding of the instructions.   The patient was advised to call back or seek an in-person evaluation if the symptoms worsen or if the condition fails to improve as anticipated.  I provided 20 minutes of non-face-to-face time during this encounter.   Bo Merino, MD   Scribed by-  Hazel Sams, PA-C

## 2019-06-23 ENCOUNTER — Other Ambulatory Visit: Payer: Self-pay

## 2019-06-23 ENCOUNTER — Encounter: Payer: Self-pay | Admitting: Rheumatology

## 2019-06-23 ENCOUNTER — Telehealth (INDEPENDENT_AMBULATORY_CARE_PROVIDER_SITE_OTHER): Payer: BC Managed Care – PPO | Admitting: Rheumatology

## 2019-06-23 DIAGNOSIS — R5383 Other fatigue: Secondary | ICD-10-CM

## 2019-06-23 DIAGNOSIS — M62838 Other muscle spasm: Secondary | ICD-10-CM

## 2019-06-23 DIAGNOSIS — Z79899 Other long term (current) drug therapy: Secondary | ICD-10-CM | POA: Diagnosis not present

## 2019-06-23 DIAGNOSIS — M5136 Other intervertebral disc degeneration, lumbar region: Secondary | ICD-10-CM

## 2019-06-23 DIAGNOSIS — Z8659 Personal history of other mental and behavioral disorders: Secondary | ICD-10-CM

## 2019-06-23 DIAGNOSIS — L409 Psoriasis, unspecified: Secondary | ICD-10-CM | POA: Diagnosis not present

## 2019-06-23 DIAGNOSIS — Z8709 Personal history of other diseases of the respiratory system: Secondary | ICD-10-CM

## 2019-06-23 DIAGNOSIS — M51369 Other intervertebral disc degeneration, lumbar region without mention of lumbar back pain or lower extremity pain: Secondary | ICD-10-CM

## 2019-06-23 DIAGNOSIS — M797 Fibromyalgia: Secondary | ICD-10-CM | POA: Diagnosis not present

## 2019-06-23 DIAGNOSIS — N3010 Interstitial cystitis (chronic) without hematuria: Secondary | ICD-10-CM

## 2019-06-23 DIAGNOSIS — L4050 Arthropathic psoriasis, unspecified: Secondary | ICD-10-CM

## 2019-06-23 DIAGNOSIS — Z8639 Personal history of other endocrine, nutritional and metabolic disease: Secondary | ICD-10-CM

## 2019-06-23 DIAGNOSIS — M5030 Other cervical disc degeneration, unspecified cervical region: Secondary | ICD-10-CM

## 2019-06-23 DIAGNOSIS — Z87898 Personal history of other specified conditions: Secondary | ICD-10-CM

## 2019-06-23 DIAGNOSIS — Z9884 Bariatric surgery status: Secondary | ICD-10-CM

## 2019-06-27 ENCOUNTER — Telehealth: Payer: Self-pay | Admitting: Rheumatology

## 2019-06-27 NOTE — Telephone Encounter (Signed)
-----   Message from Audrie Lia, RT sent at 06/23/2019  5:04 PM EST ----- Regarding: 3 MONTH F/U

## 2019-06-27 NOTE — Telephone Encounter (Signed)
Called patient to schedule 3 month follow-up appointment.  Patient stated she was currently in a meeting and would call back to schedule the appointment.

## 2019-07-13 ENCOUNTER — Other Ambulatory Visit: Payer: Self-pay | Admitting: Rheumatology

## 2019-07-13 DIAGNOSIS — L4050 Arthropathic psoriasis, unspecified: Secondary | ICD-10-CM

## 2019-07-13 MED ORDER — HYDROXYCHLOROQUINE SULFATE 200 MG PO TABS: ORAL_TABLET | ORAL | 0 refills | Status: DC

## 2019-07-13 NOTE — Telephone Encounter (Signed)
Patient calling to see if you received Plaquenil Eye exam report from [facility] in [place] office.  Patient needs Plaquenil sent into [facility]. Please call patient to advise.If patient does not answer, please leave a detailed message.

## 2019-07-13 NOTE — Telephone Encounter (Signed)
Ok to give 30-day supply.

## 2019-07-13 NOTE — Telephone Encounter (Signed)
Spoke with patient and advised we have received the notes from the visit. Patient advised we have faxed our PLQ eye exam form for them to fill out and fax back. Patient will call the eye doctor's office and have them fax the form.  Last Visit: 06/23/19 Next Visit: due June 2021 Labs: 05/12/19  Eye exam: 12/29/16 WNL (awaiting eye doctor to send PLQ eye exam form from current eye exam)  Current dose per office note on 06/23/19:  Plaquenil 200 mg 1 tablet a morning half tablet in the evening  Okay to refill Plaquenil?

## 2019-07-29 ENCOUNTER — Telehealth: Payer: Self-pay | Admitting: Rheumatology

## 2019-07-29 NOTE — Telephone Encounter (Signed)
Left message to advise patient to get tested for covid-19 if she develops signs or symptoms of Covid-19.

## 2019-07-29 NOTE — Telephone Encounter (Signed)
Please advise patient to get tested for covid-19 if she develops signs or symptoms of Covid-19.

## 2019-07-29 NOTE — Telephone Encounter (Signed)
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 Person ended up being positive for COVID. Patient wants to know if doctor recommends her being tested even though she has had both COVID vaccines? Please call to advise.

## 2019-08-10 ENCOUNTER — Telehealth: Payer: Self-pay | Admitting: Rheumatology

## 2019-08-10 ENCOUNTER — Other Ambulatory Visit: Payer: Self-pay | Admitting: Rheumatology

## 2019-08-10 DIAGNOSIS — Z79899 Other long term (current) drug therapy: Secondary | ICD-10-CM

## 2019-08-10 NOTE — Telephone Encounter (Signed)
-----   Message from Ellen Henri, CMA sent at 08/10/2019  8:38 AM EDT ----- Please call patient to schedule follow up, patient is due 09/2019. Thanks!

## 2019-08-10 NOTE — Telephone Encounter (Signed)
Called patient to schedule follow-up appointment in June.  Patient states she is not able to schedule the appointment at this time, but will call back next week to schedule.

## 2019-08-10 NOTE — Telephone Encounter (Signed)
Lab orders faxed to Dr. Annett Gula office.  Attempted to contact patient and left message on machine to advise patient that lab orders have been faxed.

## 2019-08-10 NOTE — Telephone Encounter (Signed)
Patient called requesting her labwork orders be sent to Dr. Annett Gula office.  Patient states once they receive the orders she will be able to schedule an appointment.  Please fax orders to #[phone]

## 2019-08-16 ENCOUNTER — Other Ambulatory Visit: Payer: Self-pay | Admitting: Rheumatology

## 2019-08-16 DIAGNOSIS — L4050 Arthropathic psoriasis, unspecified: Secondary | ICD-10-CM

## 2019-08-16 NOTE — Telephone Encounter (Signed)
Last Visit: 06/23/2019 telemedicine  Next Visit: left message on machine for patient to call to schedule.  Labs: 05/13/2019  Eye exam: 02/17/2019  Left message on machine to advise patient she needs a follow up appointment in June 2021, updated labs are needed and also need to clarify if she has restarted methotrexate.

## 2019-08-19 ENCOUNTER — Other Ambulatory Visit: Payer: Self-pay

## 2019-08-19 DIAGNOSIS — L4050 Arthropathic psoriasis, unspecified: Secondary | ICD-10-CM

## 2019-08-19 MED ORDER — HYDROXYCHLOROQUINE SULFATE 200 MG PO TABS: ORAL_TABLET | ORAL | 0 refills | Status: DC

## 2019-08-19 NOTE — Telephone Encounter (Signed)
Attempted to contact patient and left message on machine to advise patient to call the office.  

## 2019-08-19 NOTE — Progress Notes (Signed)
Last Visit: 06/23/2019 Next Visit: left message for patient to call and schedule.  Labs: 05/13/2019 Eye exam: 02/17/2019   Okay to refill per Dr. Corliss Skains.

## 2019-08-24 ENCOUNTER — Telehealth: Payer: Self-pay | Admitting: Rheumatology

## 2019-08-24 NOTE — Telephone Encounter (Signed)
Noted  

## 2019-08-24 NOTE — Telephone Encounter (Signed)
FYI: Patient went for labs this morning. Results from Dr. Annett Gula office to be sent here later today, or tomorrow. Please advise patient if we do not receive results.

## 2019-08-25 NOTE — Telephone Encounter (Signed)
Patient advised we have note received lab results. Patient will contact Dr. Annett Gula office and have them fax results. Patient provided with fax number.

## 2019-08-26 ENCOUNTER — Telehealth: Payer: Self-pay | Admitting: Rheumatology

## 2019-08-26 NOTE — Telephone Encounter (Signed)
FYI: Patient just spoke with her PCP. They do have her records, and will faxing them to [place] probably Monday.

## 2019-08-26 NOTE — Telephone Encounter (Signed)
Noted  

## 2019-08-30 ENCOUNTER — Telehealth: Payer: Self-pay | Admitting: *Deleted

## 2019-08-30 NOTE — Telephone Encounter (Signed)
Received lab results from Dr. Annett Gula office Labs drawn on 08/24/2019 Reviewed by Sherron Ales, PA-C Labs are in Care Everywhere  RBC 3.69 Hct 35.8 MCV 97.1 MCH 33.4 CO2 34 BUN 5 Glucose 63  Patient in on PLQ 200 mg one tablet in the morning and half a tablet at bedtime. MTX 0.6 mL weekly and Enbrel 50 mg weekly.

## 2019-09-23 ENCOUNTER — Other Ambulatory Visit: Payer: Self-pay | Admitting: Rheumatology

## 2019-09-23 DIAGNOSIS — L4050 Arthropathic psoriasis, unspecified: Secondary | ICD-10-CM

## 2019-09-23 NOTE — Telephone Encounter (Signed)
Last Visit: 06/23/2019 Next Visit: due June 2021. Message sent to the front to schedule  Labs:  08/24/2019 RBC 3.69 ,Hct 35.8 MCV 97.1 MCH 33.4 CO2 34 BUN 5 Glucose 63 Eye exam: 02/17/19 WNL  Current Dose per office note 06/23/2019: Plaquenil 200 mg 1 tablet a morning half tablet in the evening   Okay to refill per Dr. Corliss Skains

## 2019-09-23 NOTE — Telephone Encounter (Signed)
Please schedule patient for a follow up visit. Patient due June 2021. Thanks! 

## 2019-10-13 ENCOUNTER — Ambulatory Visit: Payer: BC Managed Care – PPO | Admitting: Physician Assistant

## 2019-11-02 ENCOUNTER — Telehealth: Payer: Self-pay | Admitting: Rheumatology

## 2019-11-02 IMAGING — CT CT ANGIO HEAD
2 of 12 series · 6 of 33 positions shown · IV contrast (iopamidol)
Comparison: MRI 10/13/2013

CLINICAL DATA: Syncope during class.  Persistent dizziness.

EXAM:
CT ANGIOGRAPHY HEAD AND NECK
TECHNIQUE: Multidetector CT imaging of the head and neck was performed using
the standard protocol during bolus administration of intravenous
contrast. Multiplanar CT image reconstructions and MIPs were
obtained to evaluate the vascular anatomy. Carotid stenosis
measurements (when applicable) are obtained utilizing NASCET
criteria, using the distal internal carotid diameter as the
denominator.
CONTRAST:  100mL ZBN0FK-X1T IOPAMIDOL (ZBN0FK-[id number]) INJECTION 76%

[Series 10: cta head neck thins · axial · 0.35mm/px · z∈[+1291,+1488]mm · 4 of 659 slices shown]
[im 132/659  soft-tissue]
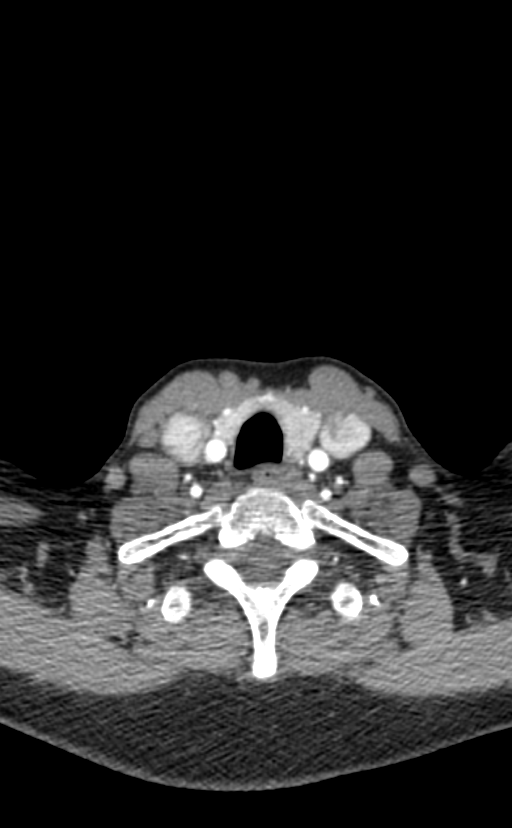
[im 264/659  bone]
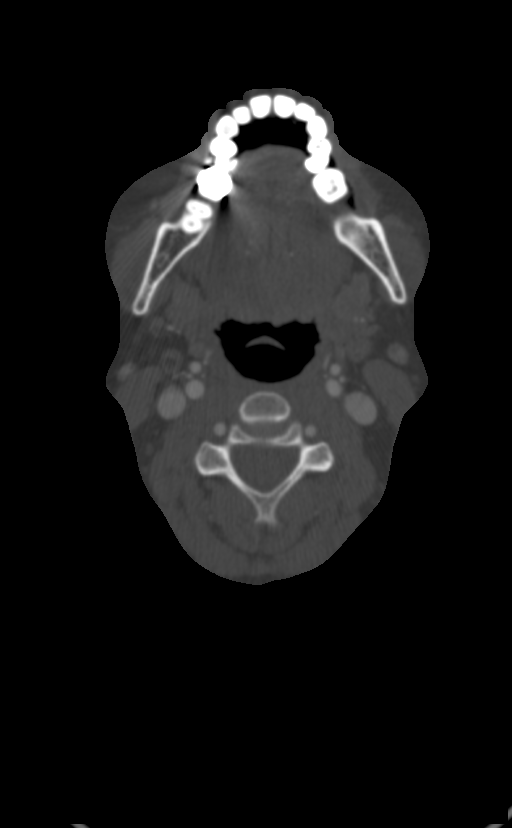
[im 395/659  soft-tissue]
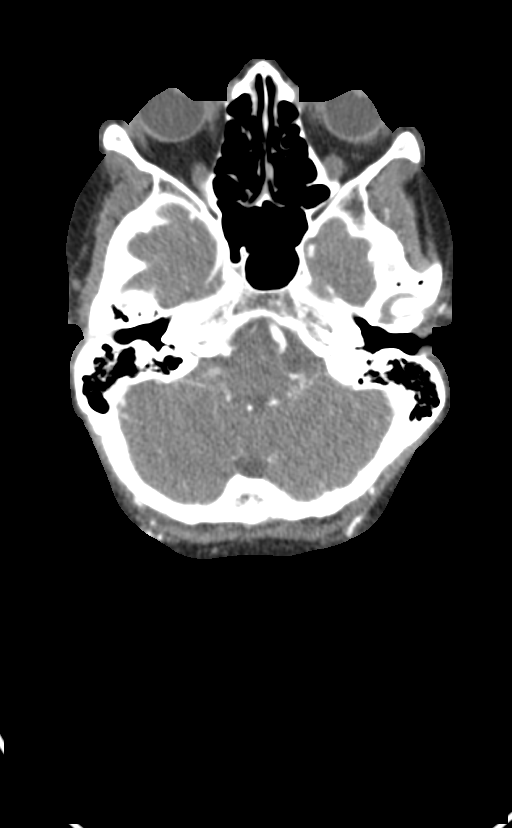
[im 527/659  bone]
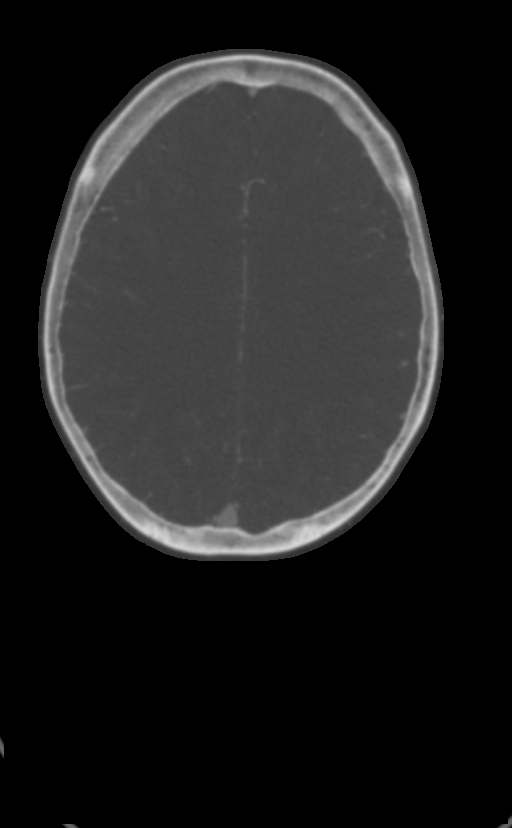

[Series 11: ax thin · axial · 0.35mm/px · z∈[+1334,+1444]mm · 2 of 330 slices shown]
[im 110/330  soft-tissue]
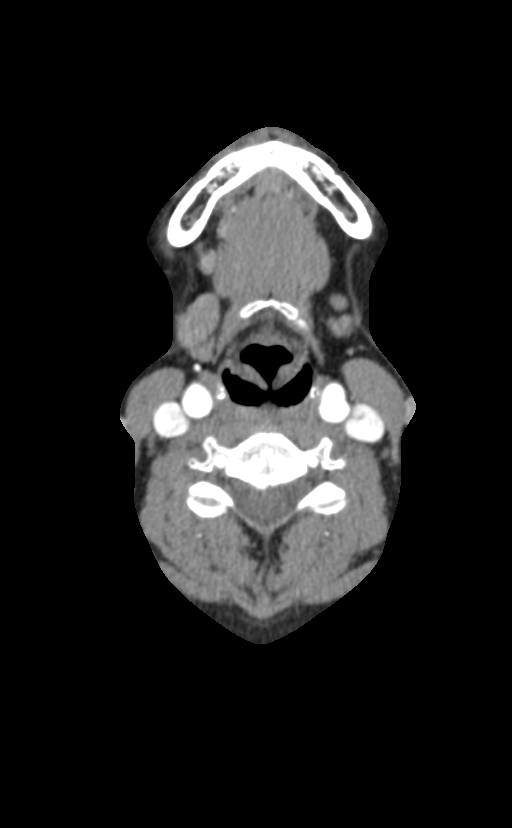
[im 220/330  soft-tissue]
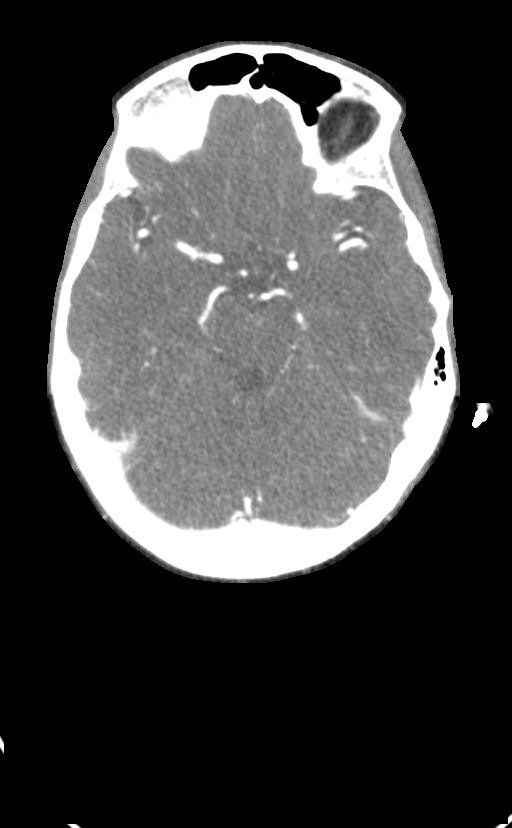

[6 of 33 positions shown; findings below may reference images not displayed]

FINDINGS: CT HEAD FINDINGS

Brain: The brain shows a normal appearance without evidence of
malformation, atrophy, old or acute small or large vessel
infarction, mass lesion, hemorrhage, hydrocephalus or extra-axial
collection.

Vascular: No hyperdense vessel. No evidence of atherosclerotic
calcification.

Skull: Normal.  No traumatic finding.  No focal bone lesion.

Sinuses/Orbits: Sinuses are clear. Orbits appear normal. Mastoids
are clear.

Other: None significant

CTA NECK FINDINGS

Aortic arch: Normal

Right carotid system: Normal

Left carotid system: Normal

Vertebral arteries: Normal

Skeleton: Minimal spondylosis C5-6.  No significant finding.

Other neck: No mass or lymphadenopathy.

Upper chest: Normal

Review of the MIP images confirms the above findings

CTA HEAD FINDINGS

Anterior circulation: Both internal carotid arteries are widely
patent through the skull base and siphon regions. No siphon
calcification. The anterior and middle cerebral vessels are normal
without proximal stenosis, aneurysm or vascular malformation.

Posterior circulation: Both vertebral arteries are widely patent to
the basilar. No basilar stenosis. Posterior circulation branch
vessels are normal.

Venous sinuses: Normal

Anatomic variants: None

Delayed phase: No abnormal enhancement

Review of the MIP images confirms the above findings
IMPRESSION: Normal head CT.

Normal CT angiography of the neck and head.

## 2019-11-02 NOTE — Telephone Encounter (Signed)
Patient advised her labs are due in August 2021. Patient will wait to have labs performed.

## 2019-11-02 NOTE — Telephone Encounter (Signed)
Patient called requesting labwork orders be sent to her PCP Dr. Annett Gula office.  Patient states she plans to go this weekend.

## 2019-11-03 ENCOUNTER — Ambulatory Visit: Payer: BC Managed Care – PPO | Admitting: Physician Assistant

## 2019-11-16 ENCOUNTER — Telehealth: Payer: Self-pay | Admitting: Rheumatology

## 2019-11-16 NOTE — Telephone Encounter (Signed)
Attempted to contact patient and per patient's husband she was unavailable at the moment, she will call back in the morning.

## 2019-11-16 NOTE — Telephone Encounter (Signed)
Patient called stating she was exposed to COVID at work earlier this week.  Patient states she is experiencing fever, sore throat, and body aches.  Patient went to the PCP where she was diagnosed with Bronchitis and took a rapid COVID test which was negative.  Patient has been prescribed Zpak.   Patient states she had both vaccines in January.  Patient is requesting a return call to discuss the medications that Dr. Corliss Skains prescribes.

## 2019-11-16 NOTE — Telephone Encounter (Signed)
Please advise the patient to hold Enbrel and MTX until she has completed the Zpak and her symptoms have completely resolved.

## 2019-11-17 ENCOUNTER — Ambulatory Visit: Payer: BC Managed Care – PPO | Admitting: Physician Assistant

## 2019-11-17 NOTE — Telephone Encounter (Signed)
We have received a fax for a refill on Enbrel for patient. We were due to discuss this with patient and her follow up visit. Records reviewed with Amber Yopp and it was shown that patient has not had a refill on her Enbrel since 04/06/2019. Patient is supposed to be taking the Enbrel weekly.

## 2019-11-18 NOTE — Telephone Encounter (Signed)
Attempted to contact the patient and left message for patient to call the office.  

## 2019-11-18 NOTE — Telephone Encounter (Signed)
Patient advised to hold Enbrel and MTX until she has completed the Zpak and her symptoms have completely resolved. Patient states she has been taking her Enbrel weekly and has received it from the pharmacy. Patient states she has not missed any doses.

## 2019-11-21 ENCOUNTER — Other Ambulatory Visit: Payer: Self-pay | Admitting: Rheumatology

## 2019-11-23 ENCOUNTER — Telehealth: Payer: Self-pay | Admitting: *Deleted

## 2019-11-23 ENCOUNTER — Other Ambulatory Visit: Payer: Self-pay | Admitting: *Deleted

## 2019-11-23 DIAGNOSIS — Z79899 Other long term (current) drug therapy: Secondary | ICD-10-CM

## 2019-11-23 MED ORDER — ENBREL SURECLICK 50 MG/ML ~~LOC~~ SOAJ: SUBCUTANEOUS | 0 refills | Status: DC

## 2019-11-23 NOTE — Telephone Encounter (Signed)
Patient contacted the office regarding MTX and Enbrel. Needs labs faxed to Dr. Cyndia Diver  Attempted to contact the patient and left message to advise patient as she was recently prescribed a Zpak she is to be hold her MTX and Enbrel. Patient advised she is to resume once she has completed the Zpak and her symptoms have resolved. Patient advised to contact the office.

## 2019-11-23 NOTE — Telephone Encounter (Signed)
Patient states she is feeling better from the recently diagnosed Bronchitis. Patient states she still has an occasional cough. Patient is requesting a refill on MTX and Enbrel. Please advise.   Last Visit: 06/23/2019 Next Visit: 12/29/2019 Labs:  08/24/2019 RBC 3.69 ,Hct 35.8 MCV 97.1 MCH 33.4 CO2 34 BUN 5 Glucose 63 TB Gold: 01/10/2019 Neg   Current Dose per office note on 06/23/2019: Enbrel 50 mg sq injections once weekly and MTX 0.6 ml sq once weekly  Okay to refill Enbrel and MTX?

## 2019-11-23 NOTE — Telephone Encounter (Signed)
Patient should be treated for bronchitis.  If her infection is completely resolved then she may resume Enbrel and methotrexate.  Please discuss Covid vaccination recommendations and precautions.

## 2019-11-24 NOTE — Telephone Encounter (Signed)
Patient returned call to the office.  attempted to contact the patient and left message of patient to call the office.

## 2019-11-24 NOTE — Telephone Encounter (Signed)
Attempted to contact the patient and left message for patient to call the office.  

## 2019-11-30 MED ORDER — METHOTREXATE SODIUM CHEMO INJECTION 50 MG/2ML: 15.0000 mg | INTRAMUSCULAR | 0 refills | Status: DC

## 2019-11-30 NOTE — Addendum Note (Signed)
Addended by: Henriette Combs on: 11/30/2019 04:53 PM   Modules accepted: Orders

## 2019-11-30 NOTE — Telephone Encounter (Signed)
Attempted to contact the patient and left message for patient to call the office.  

## 2019-11-30 NOTE — Telephone Encounter (Signed)
Patient advised per Dr. Corliss Skains, she should be treated for bronchitis.  If her infection is completely resolved then she may resume Enbrel and methotrexate.   Reviewed the following:   There is no direct evidence about the efficacy of the COVID-19 vaccine in individuals who are on medications that suppress the immune system.   Even if you are fully vaccinated, and you are on any medications that suppress your immune system, please continue to wear a mask, maintain at least six feet social distance and practice hand hygiene.   If you develop a COVID-19 infection, please contact your PCP or our office to determine if you need antibody infusion.  We anticipate that a booster vaccine will be available soon for immunosuppressed individuals. Please call our office before receiving your booster dose to make adjustments to your medication regimen.   [email or web address]

## 2019-12-28 ENCOUNTER — Other Ambulatory Visit: Payer: Self-pay | Admitting: Rheumatology

## 2019-12-28 DIAGNOSIS — L4050 Arthropathic psoriasis, unspecified: Secondary | ICD-10-CM

## 2019-12-28 NOTE — Telephone Encounter (Signed)
Last Visit:06/23/2019 Next Visit:12/29/2019 Labs:5/5/2021RBC 3.69,Hct 35.8 MCV 97.1 MCH 33.4 CO2 34 BUN 5 Glucose 63 PLQ Eye Exam:  02/17/19 WNL   Current Dose per office note on 06/23/2019:  Plaquenil 200 mg 1 tablet a morning half tablet in the evening   Okay to refill PLQ?

## 2019-12-29 ENCOUNTER — Ambulatory Visit (INDEPENDENT_AMBULATORY_CARE_PROVIDER_SITE_OTHER): Payer: BC Managed Care – PPO | Admitting: Physician Assistant

## 2019-12-29 ENCOUNTER — Other Ambulatory Visit: Payer: Self-pay

## 2019-12-29 ENCOUNTER — Encounter: Payer: Self-pay | Admitting: Physician Assistant

## 2019-12-29 VITALS — BP 117/80 | HR 72 | Resp 13 | Ht 64.0 in | Wt 142.2 lb

## 2019-12-29 DIAGNOSIS — M797 Fibromyalgia: Secondary | ICD-10-CM

## 2019-12-29 DIAGNOSIS — Z79899 Other long term (current) drug therapy: Secondary | ICD-10-CM

## 2019-12-29 DIAGNOSIS — L4050 Arthropathic psoriasis, unspecified: Secondary | ICD-10-CM | POA: Diagnosis not present

## 2019-12-29 DIAGNOSIS — M5030 Other cervical disc degeneration, unspecified cervical region: Secondary | ICD-10-CM

## 2019-12-29 DIAGNOSIS — Z9884 Bariatric surgery status: Secondary | ICD-10-CM

## 2019-12-29 DIAGNOSIS — M51369 Other intervertebral disc degeneration, lumbar region without mention of lumbar back pain or lower extremity pain: Secondary | ICD-10-CM

## 2019-12-29 DIAGNOSIS — Z8639 Personal history of other endocrine, nutritional and metabolic disease: Secondary | ICD-10-CM

## 2019-12-29 DIAGNOSIS — R5383 Other fatigue: Secondary | ICD-10-CM

## 2019-12-29 DIAGNOSIS — M62838 Other muscle spasm: Secondary | ICD-10-CM

## 2019-12-29 DIAGNOSIS — Z8709 Personal history of other diseases of the respiratory system: Secondary | ICD-10-CM

## 2019-12-29 DIAGNOSIS — M5136 Other intervertebral disc degeneration, lumbar region: Secondary | ICD-10-CM

## 2019-12-29 DIAGNOSIS — N3010 Interstitial cystitis (chronic) without hematuria: Secondary | ICD-10-CM

## 2019-12-29 DIAGNOSIS — L409 Psoriasis, unspecified: Secondary | ICD-10-CM | POA: Diagnosis not present

## 2019-12-29 DIAGNOSIS — Z8659 Personal history of other mental and behavioral disorders: Secondary | ICD-10-CM

## 2019-12-29 MED ORDER — TRIAMCINOLONE ACETONIDE 40 MG/ML IJ SUSP
10.0000 mg | INTRAMUSCULAR | Status: AC | PRN
  Administered 2019-12-29: 10 mg via INTRAMUSCULAR

## 2019-12-29 MED ORDER — LIDOCAINE HCL 1 % IJ SOLN
0.5000 mL | INTRAMUSCULAR | Status: AC | PRN
  Administered 2019-12-29: .5 mL

## 2019-12-29 NOTE — Patient Instructions (Signed)

## 2019-12-29 NOTE — Progress Notes (Signed)
Office Visit Note  Patient: Michelle [name]             Date of Birth: 03-17-1976           MRN: [id number]             PCP: Lester Owyhee., MD Referring: Lester Kaunakakai., MD Visit Date: 12/29/2019 Occupation: @GUAROCC @  Subjective:  Generalized pain  History of Present Illness: Michelle [name] is a 44 y.o. female with history of psoriatic arthritis and fibromyalgia.  She is currently on Enbrel 50 mg subcutaneous injections once weekly, Abitrexate 0.6 mL subcutaneous injections once weekly, folic acid 2 mg by mouth daily, and Plaquenil 200 mg 1 tablet in the morning and 1/2 tablet in the evening.  She has not missed any doses Enbrel, methotrexate, Plaquenil recently.  She states that she has been experiencing increased myalgias and arthralgias over the past several months.  She has also been experiencing profound fatigue.  She has only been sleeping about 4 to 6 hours per night.  She has been under a lot of stress since she has started her new job and is working 40 hours/week.  She has been having frequent fibromyalgia flares.  She is having severe generalized myalgias and muscle tenderness.  She has ongoing trochanter bursitis bilaterally.  Today she is having trapezius muscle tension and muscle tenderness and would like trigger point injections.  She continues to take Cymbalta 20 mg 1 capsule by mouth every other day.  She cannot increase the dose of Cymbalta due to experiencing urinary retention at the higher dose.  She continues to follow closely with pain management.  Activities of Daily Living:  Patient reports morning stiffness for 5-6 hours.   Patient Reports nocturnal pain.  Difficulty dressing/grooming: Denies Difficulty climbing stairs: Reports Difficulty getting out of chair: Reports Difficulty using hands for taps, buttons, cutlery, and/or writing: Reports  Review of Systems  Constitutional: Positive for fatigue.  HENT: Positive for mouth dryness. Negative for mouth sores  and nose dryness.   Eyes: Positive for dryness. Negative for pain and visual disturbance.  Respiratory: Negative for cough, hemoptysis, shortness of breath and difficulty breathing.   Cardiovascular: Negative for chest pain, palpitations, hypertension and swelling in legs/feet.  Gastrointestinal: Positive for constipation and diarrhea. Negative for blood in stool.  Endocrine: Negative for increased urination.  Genitourinary: Positive for difficulty urinating.  Musculoskeletal: Positive for arthralgias, joint pain, myalgias, morning stiffness, muscle tenderness and myalgias. Negative for joint swelling and muscle weakness.  Skin: Positive for color change. Negative for pallor, rash, hair loss, nodules/bumps, skin tightness, ulcers and sensitivity to sunlight.  Allergic/Immunologic: Negative for susceptible to infections.  Neurological: Positive for headaches and weakness. Negative for dizziness and numbness.  Hematological: Positive for bruising/bleeding tendency. Negative for swollen glands.  Psychiatric/Behavioral: Negative for depressed mood. The patient is not nervous/anxious.     PMFS History:  Patient Active Problem List   Diagnosis Date Noted  . Interstitial cystitis 11/24/2016  . High risk medication use 11/02/2016  . History of depression 11/02/2016  . History of asthma 11/02/2016  . Prolonged QT interval 11/02/2016  . History of gastric bypass 11/02/2016  . History of hypothyroidism 11/02/2016  . History of renal calcinosis 11/02/2016  . Psoriatic arthropathy (HCC) 04/17/2016  . Psoriasis 04/17/2016  . ANA positive 04/17/2016  . Other fatigue 04/17/2016  . DDD (degenerative disc disease), cervical 04/17/2016  . DDD (degenerative disc disease), lumbar 04/17/2016  . History of diabetes mellitus 04/17/2016  .  History of migraine 04/17/2016  . Fatty liver 04/17/2016  . Diabetes (HCC)   . Depression   . Fibromyalgia   . Muscle cramps     Past Medical History:  Diagnosis  Date  . Depression   . Diabetes (HCC)   . Fibromyalgia   . Migraine   . Muscle cramps     History reviewed. No pertinent family history. Past Surgical History:  Procedure Laterality Date  . ABDOMINAL HYSTERECTOMY    . CESAREAN SECTION    . CHOLECYSTECTOMY    . FACET JOINT INJECTION    . GASTRIC BYPASS    . SI Joint Injection    . tummy tuck  03/24/2018   Social History   Social History Narrative   Patient lives at home alone with her husband and she works full time disability .    Right handed.   Caffeine one times per day.   Immunization History  Administered Date(s) Administered  . Influenza Inj Mdck Quad Pf 02/13/2017  . Influenza,inj,Quad PF,6+ Mos 01/23/2016, 01/27/2018  . Influenza-Unspecified 01/23/2016  . Moderna SARS-COVID-2 Vaccination 04/17/2019, 05/21/2019  . Pneumococcal Polysaccharide-23 01/20/2012  . Tdap 07/28/2012     Objective: Vital Signs: BP 117/80 (BP Location: Left Arm, Patient Position: Sitting, Cuff Size: Normal)   Pulse 72   Resp 13   Ht 5\' 4"  (1.626 m)   Wt 142 lb 3.2 oz (64.5 kg)   BMI 24.41 kg/m    Physical Exam Vitals and nursing note reviewed.  Constitutional:      Appearance: She is well-developed.  HENT:     Head: Normocephalic and atraumatic.  Eyes:     Conjunctiva/sclera: Conjunctivae normal.  Pulmonary:     Effort: Pulmonary effort is normal.  Abdominal:     Palpations: Abdomen is soft.  Musculoskeletal:     Cervical back: Normal range of motion.  Skin:    General: Skin is warm and dry.     Capillary Refill: Capillary refill takes less than 2 seconds.  Neurological:     Mental Status: She is alert and oriented to person, place, and time.  Psychiatric:        Behavior: Behavior normal.      Musculoskeletal Exam: Generalized hyperalgesia and positive tender points on exam.  C-spine, thoracic spine, lumbar spine have good range of motion.  No SI joint tenderness. Trapezius muscle tension and tenderness.  Shoulder  joints, elbow joints, wrist joints, MCPs, PIPs, DIPs have good range of motion with no synovitis. Tenderness over the medial epicondyle of both elbows. She has complete fist formation bilaterally.  Hip joints, knee joints, ankle joints, MTPs, PIPs, DIPs have good range of motion with no synovitis.  No warmth or effusion of bilateral knee joints noted.  She has tenderness of both ankle joints.  No achilles tendonitis or plantar fasciitis.    CDAI Exam: CDAI Score: -- Patient Global: --; Provider Global: -- Swollen: --; Tender: -- Joint Exam 12/29/2019   No joint exam has been documented for this visit   There is currently no information documented on the homunculus. Go to the Rheumatology activity and complete the homunculus joint exam.  Investigation: No additional findings.  Imaging: No results found.  Recent Labs: Lab Results  Component Value Date   WBC 5.6 01/10/2019   HGB 12.5 01/10/2019   PLT 188 01/10/2019   NA 138 01/10/2019   K 4.5 01/10/2019   CL 102 01/10/2019   CO2 27 01/10/2019   GLUCOSE 90 01/10/2019  BUN 5 (L) 01/10/2019   CREATININE 0.60 01/10/2019   BILITOT 0.3 01/10/2019   ALKPHOS 113 10/29/2016   AST 20 01/10/2019   ALT 20 01/10/2019   PROT 6.8 01/10/2019   ALBUMIN 4.3 10/29/2016   CALCIUM 9.1 01/10/2019   GFRAA 128 01/10/2019   QFTBGOLDPLUS NEGATIVE 01/10/2019    Speciality Comments: PLQ Eye Exam:  02/17/19 WNL at [facility]. Follow up 1 year.   Procedures:  Trigger Point Inj  Date/Time: 12/29/2019 4:52 PM Performed by: Gearldine Bienenstockale, Margarethe Virgen M, PA-C Authorized by: Gearldine Bienenstockale, Quinnton Bury M, PA-C   Consent Given by:  Patient Site marked: the procedure site was marked   Timeout: prior to procedure the correct patient, procedure, and site was verified   Indications:  Pain Total # of Trigger Points:  2 Location: neck   Needle Size:  27 G Approach:  Dorsal Medications #1:  0.5 mL lidocaine 1 %; 10 mg triamcinolone acetonide 40 MG/ML Medications #2:  0.5  mL lidocaine 1 %; 10 mg triamcinolone acetonide 40 MG/ML Patient tolerance:  Patient tolerated the procedure well with no immediate complications   Allergies: Zoledronic acid, Duloxetine, Flunisolide, Vilazodone, Latex, and Tape   Assessment / Plan:     Visit Diagnoses: Psoriatic arthropathy (HCC): She has no synovitis or dactylitis on exam.  She has intermittent pain and stiffness in multiple joints. Overall she is clinically doing well on Enbrel 50 mg subcutaneous injections once weekly, methotrexate 0.6 mL subcu injections once weekly, folic acid 2 mg by mouth daily, Plaquenil 200 mg 1 tablet in the morning and half tablet in the evening.  She has not missed any doses of these medications recently.  She has been experiencing increased myalgias and arthralgias over the past several months.  She has not had any Achilles tendinitis or plantar fasciitis.  She has no SI joint tenderness palpation on exam today.  She has some tenderness in both ankle joints but no swelling was noted.  She is occasional bouts of psoriasis and uses clobetasol cream topically as needed.  Her psoriatic arthritis seems to be well controlled on the current treatment regimen.  She does not want to make any medication changes at this time. She does not need any refills at this time.  She was advised to notify us if she develops increased joint pain or joint swelling.  She will follow up in 5 months.   Psoriasis: She does not have any active patches of psoriasis at this time.  She uses clobetasol cream topically as needed.  High risk medication use -CBC and CMP were drawn on 12/19/2019.  Hemoglobin was 12.0 at that time.  White blood cell count was within normal limits.  GFR and LFTs were within normal limits.  She will be due to update lab work in November and every 3 months to monitor for drug toxicity.  TB gold was negative on 01/10/2019.  Future order for TB gold was placed today.  Plan: QuantiFERON-TB Gold Plus  Fibromyalgia:  She has generalized hyperalgesia and positive tender points on exam.  She has been having more frequent and severe fibromyalgia flares over the past several months.  She attributes worsening myalgias and fatigue due to the increased dose she has been under as well as frequent weather changes.  She presents today with trapezius muscle tension and muscle tenderness bilaterally.  She has been experiencing muscle spasms more frequently.  She uses a TENS unit on a regular basis and typically has pain relief for about 2  hours.  She requested trigger point injections today.  She tolerated the procedure well.  The procedure note was completed above.  She continues to have trochanter bursitis bilaterally and perform stretching exercises on a regular basis.  We discussed the importance of regular exercise and good sleep hygiene.  She will be referred to integrative therapies.  She will continue to follow up with pain management.   Other fatigue: She has been experiencing profound fatigue.  She experiences excessive daytime drowsiness while working.  She has only been sleeping about 4 to 6 hours per night.  We discussed the importance of regular exercise and good sleep hygiene.  Trapezius muscle spasm: She has trapezius muscle tension and muscle tenderness bilaterally.  She has been experiencing muscle spasms more frequently.  She uses a TENS unit on occasion as well as flector patches as needed.  She requested trigger point injections today.  She tolerated the procedure well.  The procedure note was completed above.  Aftercare was discussed.  DDD (degenerative disc disease), cervical: She has been experiencing some increased discomfort and stiffness in her neck.  She has no symptoms of radiculopathy at this time.  She is experiencing trapezius muscle tension and muscle tenderness bilaterally.  She trigger point injections today.  DDD (degenerative disc disease), lumbar: She has chronic lower back pain.  Midline spinal  tenderness in lumbar region noted.  No SI joint tenderness noted.  We discussed the importance of using her stand-up desk and changing positions frequently throughout the day.  Core strengthening was also discussed.  Other medical conditions are listed as follows:   Interstitial cystitis  History of diabetes mellitus  History of asthma  History of depression  History of hypothyroidism  History of gastric bypass  Orders: Orders Placed This Encounter  Procedures  . Trigger Point Inj  . QuantiFERON-TB Gold Plus   No orders of the defined types were placed in this encounter.     Follow-Up Instructions: Return in about 5 months (around 05/30/2020) for Psoriatic arthritis, Fibromyalgia.   Gearldine Bienenstock, PA-C  Note - This record has been created using Dragon software.  Chart creation errors have been sought, but may not always  have been located. Such creation errors do not reflect on  the standard of medical care.

## 2019-12-30 ENCOUNTER — Telehealth: Payer: Self-pay | Admitting: *Deleted

## 2019-12-30 NOTE — Telephone Encounter (Signed)
Labs received from Dr. Cyndia Diver Drawn on 12/19/2019 (results in Care Everywhere) Reviewed by Sherron Ales, PA-C  Ferritin 16 Vitamin B 12 >4,500 CO2 32 BUN 5 Glucose 58 RBC 3.75 Hgb 12 Hct 34.7  Patient in on Enbrel 50 mg SQ weekly, MTX 0.6 mL weekly and PLQ 1 tab po AM and 1/2 tab po PM

## 2020-02-06 ENCOUNTER — Other Ambulatory Visit: Payer: Self-pay | Admitting: Rheumatology

## 2020-02-06 NOTE — Telephone Encounter (Signed)
Last Visit: 12/29/2019 Next Visit: 05/31/2020 Labs: 12/19/2019 CO2 32 BUN 5 Glucose 58 RBC 3.75 Hgb 12 Hct 34.7 TB Gold: 01/10/2019 Neg   Current Dose per office note 9/96/2021: Enbrel 50 mg subcutaneous injections once weekly  DX: Psoriatic arthropathy   Okay to refill Enbrel?

## 2020-02-27 ENCOUNTER — Other Ambulatory Visit: Payer: Self-pay | Admitting: Physician Assistant

## 2020-02-27 ENCOUNTER — Other Ambulatory Visit: Payer: Self-pay | Admitting: Rheumatology

## 2020-02-27 DIAGNOSIS — L4050 Arthropathic psoriasis, unspecified: Secondary | ICD-10-CM

## 2020-02-27 NOTE — Telephone Encounter (Signed)
Last Visit: 12/29/2019 Next Visit: 05/31/2020 Labs: 12/19/2019 CO2 32 BUN 5 Glucose 58 RBC 3.75 Hgb 12 Hct 34.7  Current Dose per office note on 12/29/2019: methotrexate 0.6 mL subcu injections once weekly Dx: Psoriatic arthropathy   Okay to refill MTX?

## 2020-03-16 ENCOUNTER — Other Ambulatory Visit: Payer: Self-pay | Admitting: Physician Assistant

## 2020-03-19 NOTE — Telephone Encounter (Addendum)
Last Visit: 12/29/2019 Next Visit: 05/31/2020 Labs: 12/19/2019 CO2 32 BUN 5 Glucose 58 RBC 3.75 Hgb 12 Hct 34.7  Current Dose per office note 12/29/2019: methotrexate 0.6 mL subcu injections once weekly Dx: Psoriatic arthropathy   Attempted to contact the patient and left message to advise patient she is due to update labs.  Okay to refill 30 day supply MTX?

## 2020-03-30 ENCOUNTER — Other Ambulatory Visit: Payer: Self-pay | Admitting: Physician Assistant

## 2020-03-30 DIAGNOSIS — L4050 Arthropathic psoriasis, unspecified: Secondary | ICD-10-CM

## 2020-03-30 NOTE — Telephone Encounter (Addendum)
Last Visit:12/29/2019 Next Visit:05/31/2020 Labs:8/30/2021CO2 32 BUN 5 Glucose 58 RBC 3.75 Hgb 12 Hct 34.7 PLQ Eye Exam:  Current Dose per office note on 12/29/2019:  Plaquenil 200 mg 1 tablet in the morning and half tablet in the evening. Dx: Psoriatic arthropathy   Left message to advise patient she is due to update her PLQ eye exam.   Okay to refill PLQ?

## 2020-05-05 ENCOUNTER — Other Ambulatory Visit: Payer: Self-pay | Admitting: Physician Assistant

## 2020-05-05 DIAGNOSIS — L4050 Arthropathic psoriasis, unspecified: Secondary | ICD-10-CM

## 2020-05-07 NOTE — Telephone Encounter (Signed)
Patient has PLQ eye exam at [facility] on 06/15/2020

## 2020-05-07 NOTE — Telephone Encounter (Signed)
Last Visit:12/29/2019 Next Visit: 05/31/2020 Labs: Labs received from Dr. Cyndia Diver Drawn on 12/19/2019 (results in Care Everywhere) Reviewed by Sherron Ales, PA-C  Ferritin 16 Vitamin B 12 >4,500 CO2 32 BUN 5 Glucose 58 RBC 3.75 Hgb 12 Hct 34.7  Eye exam: 02/17/2019 WNL, LMOM PLQ eye exam due.  Current Dose per office note 12/29/2019, Plaquenil 200 mg 1 tablet in the morning and half tablet in the evening  TA:[id number] arthropathy   Okay to refill Plaquenil?

## 2020-05-07 NOTE — Telephone Encounter (Signed)
No further refills on PLQ will be given until the eye exam.

## 2020-05-07 NOTE — Telephone Encounter (Signed)
Patient is on Enbrel and MTX. Labs were due in 03/2020. She should get labs ASAP to monitor for drug toxicity.

## 2020-05-09 ENCOUNTER — Other Ambulatory Visit: Payer: Self-pay | Admitting: Rheumatology

## 2020-05-09 NOTE — Telephone Encounter (Signed)
Last Visit:12/29/2019 Next Visit:05/31/2020  Okay to refill per Dr. Corliss Skains

## 2020-05-29 ENCOUNTER — Other Ambulatory Visit: Payer: Self-pay | Admitting: *Deleted

## 2020-05-29 NOTE — Telephone Encounter (Signed)
Left message to advise patient she is overdue for labs. Advised unable to refill unable labs are updated.

## 2020-05-31 ENCOUNTER — Ambulatory Visit: Payer: BC Managed Care – PPO | Admitting: Physician Assistant

## 2020-06-04 NOTE — Progress Notes (Signed)
Virtual Visit via Telephone Note  I connected with Michelle [name] on 06/05/20 at  4:00 PM EST by telephone and verified that I am speaking with the correct person using two identifiers.  Location: Patient: Home  Provider: Clinic  This service was conducted via virtual visit.  The patient was located at home. I was located in my office.  Consent was obtained prior to the virtual visit and is aware of possible charges through their insurance for this visit.  The patient is an established patient.  Dr. Corliss Skains, MD conducted the virtual visit and Sherron Ales, PA-C acted as scribe during the service.  Office staff helped with scheduling follow up visits after the service was conducted.     I discussed the limitations, risks, security and privacy concerns of performing an evaluation and management service by telephone and the availability of in person appointments. I also discussed with the patient that there may be a patient responsible charge related to this service. The patient expressed understanding and agreed to proceed.  CC: Medication monitoring  History of Present Illness: Patient is a 45 year old female with a past medical history of psoriatic arthritis, fibromyalgia, and DDD. She is prescribed Enbrel 50 mg subcutaneous injections once weekly, methotrexate 0.6 mL sq injections once weekly, folic acid 2 mg by mouth daily, Plaquenil 200 mg 1 tablet in the morning and half tablet in the evening.  She states this combination of medications have been every effective at managing her RA.  She has occasional arthralgias and stiffness due to weather changes.  She has intermittent swelling in both knee joints and both ankles.  She denies any achilles tendonitis or plantar fasciitis.  She has been using a tennis ball to stretch her plantar fascia daily.  She has ongoing eye dryness but no eye pain or photophobia.  She uses OTC eye drops daily for symptomatic relief.  She has a few small patches of psoriasis  on her feet and uses topical agents as needed.  She noticed a significant improvement in her fibromyalgia symptoms while going to integrative therapist. Her neck pain has improved with PT and massage therapy.  She has been doing stretch and flow yoga on a regular basis.  Review of Systems  Constitutional: Positive for malaise/fatigue.  HENT: Positive for congestion.   Eyes: Negative for redness.  Respiratory: Negative for shortness of breath.   Cardiovascular: Positive for leg swelling.  Gastrointestinal: Positive for constipation and diarrhea.  Genitourinary: Positive for frequency and urgency.  Musculoskeletal: Positive for joint pain.  Skin: Positive for rash.  Neurological: Negative for weakness.  Endo/Heme/Allergies: Bruises/bleeds easily.  Psychiatric/Behavioral: The patient has insomnia.       Observations/Objective: Physical Exam Neurological:     Mental Status: She is alert and oriented to person, place, and time.  Psychiatric:        Mood and Affect: Mood and affect normal.        Cognition and Memory: Memory normal.        Judgment: Judgment normal.    Patient reports morning stiffness for 2 hours.   Patient reports nocturnal pain.  Difficulty dressing/grooming: Reports Difficulty climbing stairs: Reports Difficulty getting out of chair: Reports Difficulty using hands for taps, buttons, cutlery, and/or writing: Reports   Assessment and Plan: Visit Diagnoses: Psoriatic arthropathy (HCC): She has not had any recent psoriatic arthritis flares.  She is clinically doing well on Enbrel 50 mg sq injections once weekly, Methotrexate 0.6 ml sq injections once weekly, folic  acid 2 mg by mouth daily, and plaquenil 200 mg 1.5 tablets by mouth daily.  She is tolerating these medications without any side effects.  She is found this current regimen to be effective at managing her psoriatic arthritis.  She has occasional plantar fasciitis and has been using a tennis ball on a daily  basis to stretch the plantar fascia.  She has not had any Achilles tendinitis.  She has no SI joint discomfort at this time.  She is a few small patches of psoriasis on both feet and uses topical agents as needed.  She will continue on the current treatment regimen.  She was advised to notify us if she develops increased joint pain or joint swelling.  She will follow-up in the office in 3 to 4 months.  Psoriasis: She has a few small patches of psoriasis on her feet. She uses topical agents as needed.   High risk medication use -Enbrel 50 mg subcutaneous injections once weekly, methotrexate 0.6 mL sq injections once weekly, folic acid 2 mg by mouth daily, Plaquenil 200 mg 1 tablet in the morning and half tablet in the evening. CBC updated on 05/03/20.  She had a recent iron infusion at [facility]. She is due to update CMP and TB gold. Orders are in place and will be faxed to PCPs office.  She has not had any recent infections. She has received 2 moderna covid-19 vaccine doses. She was encouraged to receive the booster dose.  She was advised to hold MTX for one week after receiving the booster dose.   She was advised to hold enbrel and MTX if she develops signs or symptoms of an infection and to resume once the infection has completely cleared.  Fibromyalgia: She has been experiencing less frequent and severe flares since going to physical therapy at integrative therapies.  She noticed a significant imporovemen in her generalized pain.  She has occasional flares caused by weather changes. Discussed the importance of regular exercise and good sleep hygiene.  She was encouraged to continue home exercises. She will remain on cymbalta 20 mg 1 capsule by mouth every other day.   Other fatigue: Stable.  She was encouraged to continue home exercises.   Trapezius muscle spasm: She has trapezius muscle tension and tenderness bilaterally. Her discomfort was alleviated while going to integrative therapies.  She was  encouraged to perform home exercises.    DDD (degenerative disc disease), cervical: She has been performing stretching exercises daily, which has alleviated her discomfort. She noticed a significant improvement in her neck pain while going to PT at integrative therapy.  She has also been going to massage therapy regularly.   DDD (degenerative disc disease), lumbar: She has intermittent discomfort in her lower back.     Other medical conditions are listed as follows:   Interstitial cystitis  History of diabetes mellitus  History of asthma  History of depression  History of hypothyroidism  History of gastric bypass  Follow Up Instructions: She will follow up in 3-4 months.    I discussed the assessment and treatment plan with the patient. The patient was provided an opportunity to ask questions and all were answered. The patient agreed with the plan and demonstrated an understanding of the instructions.   The patient was advised to call back or seek an in-person evaluation if the symptoms worsen or if the condition fails to improve as anticipated.  I provided  30 minutes of non-face-to-face time during this encounter.  Scribed by-  Sherron Ales, PA-C   I reviewed the above note. I attest to the accuracy of the document.  [name], MD

## 2020-06-05 ENCOUNTER — Other Ambulatory Visit: Payer: Self-pay

## 2020-06-05 ENCOUNTER — Encounter: Payer: Self-pay | Admitting: Rheumatology

## 2020-06-05 ENCOUNTER — Telehealth (INDEPENDENT_AMBULATORY_CARE_PROVIDER_SITE_OTHER): Payer: BC Managed Care – PPO | Admitting: Rheumatology

## 2020-06-05 VITALS — Ht 64.5 in

## 2020-06-05 DIAGNOSIS — M5136 Other intervertebral disc degeneration, lumbar region: Secondary | ICD-10-CM

## 2020-06-05 DIAGNOSIS — Z79899 Other long term (current) drug therapy: Secondary | ICD-10-CM

## 2020-06-05 DIAGNOSIS — M797 Fibromyalgia: Secondary | ICD-10-CM

## 2020-06-05 DIAGNOSIS — Z8639 Personal history of other endocrine, nutritional and metabolic disease: Secondary | ICD-10-CM

## 2020-06-05 DIAGNOSIS — L4050 Arthropathic psoriasis, unspecified: Secondary | ICD-10-CM | POA: Diagnosis not present

## 2020-06-05 DIAGNOSIS — Z9884 Bariatric surgery status: Secondary | ICD-10-CM

## 2020-06-05 DIAGNOSIS — N3010 Interstitial cystitis (chronic) without hematuria: Secondary | ICD-10-CM

## 2020-06-05 DIAGNOSIS — Z8709 Personal history of other diseases of the respiratory system: Secondary | ICD-10-CM

## 2020-06-05 DIAGNOSIS — M62838 Other muscle spasm: Secondary | ICD-10-CM

## 2020-06-05 DIAGNOSIS — Z87898 Personal history of other specified conditions: Secondary | ICD-10-CM

## 2020-06-05 DIAGNOSIS — Z8659 Personal history of other mental and behavioral disorders: Secondary | ICD-10-CM

## 2020-06-05 DIAGNOSIS — R5383 Other fatigue: Secondary | ICD-10-CM

## 2020-06-05 DIAGNOSIS — M5030 Other cervical disc degeneration, unspecified cervical region: Secondary | ICD-10-CM

## 2020-06-05 DIAGNOSIS — L409 Psoriasis, unspecified: Secondary | ICD-10-CM | POA: Diagnosis not present

## 2020-06-05 DIAGNOSIS — M51369 Other intervertebral disc degeneration, lumbar region without mention of lumbar back pain or lower extremity pain: Secondary | ICD-10-CM

## 2020-06-07 ENCOUNTER — Telehealth: Payer: Self-pay | Admitting: Rheumatology

## 2020-06-07 NOTE — Telephone Encounter (Signed)
I tried contacting patient to schedule a 3 month follow up (around 08/31/2020). Patient did not answer, and voicemail was full.

## 2020-06-09 ENCOUNTER — Other Ambulatory Visit: Payer: Self-pay | Admitting: Rheumatology

## 2020-06-11 NOTE — Telephone Encounter (Signed)
Last Visit: 06/05/2020 Next Visit: Due July 2022, Message sent to the front to schedule patient.   Current Dose per office note on 06/05/2020: not discussed.   Last Fill: 07/20/2018   Okay to refill per Dr. Corliss Skains

## 2020-06-12 ENCOUNTER — Telehealth: Payer: Self-pay | Admitting: *Deleted

## 2020-06-12 NOTE — Telephone Encounter (Signed)
Labs received from: Jacinta Shoe, MD Drawn on:06/08/2020 Reviewed by: Sherron Ales, PA-C  Labs drawn: CMP, TB Gold,   Results:Calcium 8.4 Total Protein 5.9 TB Gold Negative  Labs in Care Everywhere

## 2020-06-13 ENCOUNTER — Other Ambulatory Visit: Payer: Self-pay

## 2020-06-13 DIAGNOSIS — L4050 Arthropathic psoriasis, unspecified: Secondary | ICD-10-CM

## 2020-06-13 MED ORDER — ENBREL SURECLICK 50 MG/ML ~~LOC~~ SOAJ: 50.0000 mg | SUBCUTANEOUS | 0 refills | Status: DC

## 2020-06-13 MED ORDER — HYDROXYCHLOROQUINE SULFATE 200 MG PO TABS
ORAL_TABLET | ORAL | 0 refills | Status: DC
Start: 2020-06-13 — End: 2020-09-18

## 2020-06-13 MED ORDER — METHOTREXATE SODIUM CHEMO INJECTION (PF) 50 MG/2ML: INTRAMUSCULAR | 0 refills | Status: DC

## 2020-06-13 NOTE — Telephone Encounter (Signed)
Patient called requesting prescription refills of Enbrel, Plaquenil and Methotrexate.  Methotrexate and Plaquenil to be sent to [facility].  Embrel to be sent to [facility]

## 2020-06-13 NOTE — Telephone Encounter (Addendum)
Last Visit: 06/05/2020 Next Visit: 11/15/2020 Labs in Care Everywhere: Labs drawn: CMP, TB Gold,  Results:Calcium 8.4 Total Protein 5.9 TB Gold Negative  Eye exam: 02/17/2019, scheduled 06/15/2020    Current Dose per office note 06/05/2020, Enbrel 50 mg subcutaneous injections once weekly, methotrexate 0.6 mL sq injections once weekly, Plaquenil 200 mg 1 tablet in the morning and half tablet in the evening.  DX: Psoriatic arthropathy   Last Fill: Enbrel - 03/19/2020, MTX  11/08/221 , PLQ 05/07/2020  Okay to refill Plaquenil, MTX, Enbrel?

## 2020-06-15 ENCOUNTER — Telehealth: Payer: Self-pay | Admitting: *Deleted

## 2020-06-15 NOTE — Telephone Encounter (Signed)
Received notification from [facility] regarding a prior authorization for Diclofenac Gel. Authorization has been APPROVED from  :05/14/2020;Coverage End Date:06/13/2021;

## 2020-08-06 ENCOUNTER — Other Ambulatory Visit: Payer: Self-pay | Admitting: *Deleted

## 2020-08-06 ENCOUNTER — Telehealth: Payer: Self-pay

## 2020-08-06 DIAGNOSIS — Z79899 Other long term (current) drug therapy: Secondary | ICD-10-CM

## 2020-08-06 DIAGNOSIS — Z8639 Personal history of other endocrine, nutritional and metabolic disease: Secondary | ICD-10-CM

## 2020-08-06 DIAGNOSIS — N3010 Interstitial cystitis (chronic) without hematuria: Secondary | ICD-10-CM

## 2020-08-06 DIAGNOSIS — L409 Psoriasis, unspecified: Secondary | ICD-10-CM

## 2020-08-06 DIAGNOSIS — Z9225 Personal history of immunosupression therapy: Secondary | ICD-10-CM

## 2020-08-06 DIAGNOSIS — L4050 Arthropathic psoriasis, unspecified: Secondary | ICD-10-CM

## 2020-08-06 NOTE — Telephone Encounter (Signed)
Patient called stating she took a home COVID test this morning and found out she is positive.  Patient states her symptoms began yesterday which include fever (102 deg), cough, congestion, nausea, diarrhea, bad headache.  Patient states she took Tylenol and cold and flu medication.  Patient requested a return call to let her know if she needs to discontinue her medication.

## 2020-08-06 NOTE — Telephone Encounter (Signed)
Please advise the patient to hold Enbrel and methotrexate for 2-3 weeks after her symptoms have completely resolved.  She can continue on PLQ as prescribed.   Please refer the patient to the covid-19 infusion clinic in order to see if she will qualify for the monoclonal antibody infusion.

## 2020-08-06 NOTE — Progress Notes (Signed)
For post Covid exposure:  Date of Symptom Onset: 08/05/2020 Medical Conditions: Diabetes, Psoriatic arthropathy, Psoriasis, ANA positive Is patient receiving IV therapy for chronic conditions? Iron infusion at Hematology and [facility]col[facility] at [facility] Has patient had an organ transplant or bone marrow transplant in the last 6 month? No Vaccination Status: Vaccinated Name of Vaccine Received: Moderna Number of doses: 2   Patient advised to hold Enbrel and methotrexate for 2-3 weeks after her symptoms have completely resolved.   Patient advised it may take up to 48 hours for the infusion center to contact them if (he/she) qualifies. Patient advised if symptoms worsen to go to the emergency room for evaluation.

## 2020-08-06 NOTE — Telephone Encounter (Signed)
Please advise. Thank you

## 2020-08-06 NOTE — Telephone Encounter (Signed)
I called patient, patient verbalized understanding, referral placed for monoclonal antibody infusion.

## 2020-08-07 ENCOUNTER — Telehealth: Payer: Self-pay

## 2020-08-07 ENCOUNTER — Other Ambulatory Visit: Payer: Self-pay | Admitting: Oncology

## 2020-08-07 DIAGNOSIS — U071 COVID-19: Secondary | ICD-10-CM

## 2020-08-07 MED ORDER — NIRMATRELVIR/RITONAVIR (PAXLOVID)TABLET: 3.0000 | ORAL_TABLET | Freq: Two times a day (BID) | ORAL | 0 refills | Status: AC

## 2020-08-07 NOTE — Progress Notes (Signed)
Outpatient Oral COVID Treatment Note  I connected with Michelle [name] on 08/07/2020/10:58 AM by telephone and verified that I am speaking with the correct person using two identifiers.  I discussed the limitations, risks, security, and privacy concerns of performing an evaluation and management service by telephone and the availability of in person appointments. I also discussed with the patient that there may be a patient responsible charge related to this service. The patient expressed understanding and agreed to proceed.  Patient location: Home Provider location: Clinic   Diagnosis: COVID-19 infection  Purpose of visit: Discussion of potential use of Molnupiravir or Paxlovid, a new treatment for mild to moderate COVID-19 viral infection in non-hospitalized patients.   Subjective: Patient is a 45 y.o. female who has been diagnosed with COVID 19 viral infection.  Their symptoms began on 08/05/20.  Past Medical History:  Diagnosis Date  . Depression   . Diabetes (HCC)   . Fibromyalgia   . Migraine   . Muscle cramps     Allergies  Allergen Reactions  . Zoledronic Acid Other (See Comments)  . Duloxetine Swelling    Can only take a 20 or 30 mg dosage.  . Flunisolide Nausea Only  . Vilazodone Other (See Comments)    Ask pt for reaction and enter  . Latex Rash    redness  . Tape Rash     Current Outpatient Medications:  .  albuterol (PROVENTIL) (5 MG/ML) 0.5% nebulizer solution, Take 2.5 mg by nebulization every 6 (six) hours as needed for wheezing or shortness of breath., Disp: , Rfl:  .  albuterol (VENTOLIN HFA) 108 (90 Base) MCG/ACT inhaler, Inhale 1-2 puffs into the lungs every 6 (six) hours as needed for wheezing or shortness of breath. Take as directed., Disp: , Rfl:  .  BELBUCA 750 MCG FILM, USE 1 FILM INSIDE CHEECK EVERY 12 HOURS AS DIRECTED (Patient not taking: Reported on 06/05/2020), Disp: , Rfl:  .  Buprenorphine HCl (BELBUCA) 900 MCG FILM, Place inside cheek., Disp: ,  Rfl:  .  calcipotriene (DOVONOX) 0.005 % cream, Apply 1 application topically 2 (two) times daily as needed (psoriasis). , Disp: , Rfl:  .  Calcium Carb-Cholecalciferol (CALCIUM 600 + D PO), Take 1 tablet by mouth daily., Disp: , Rfl:  .  calcium carbonate (OS-CAL) 600 MG TABS tablet, 600 mg., Disp: , Rfl:  .  Calcium Carbonate Antacid (TUMS PO), as needed., Disp: , Rfl:  .  cetirizine (ZYRTEC) 10 MG tablet, Take 10 mg by mouth daily., Disp: , Rfl:  .  clobetasol cream (TEMOVATE) 0.05 %, Apply 1 application topically 2 (two) times daily., Disp: 30 g, Rfl: 0 .  cyclobenzaprine (FLEXERIL) 10 MG tablet, Take one tab TID PRN.  Please call [phone] to schedule appt to continue refills., Disp: 90 tablet, Rfl: 0 .  dexamethasone (DECADRON) 2 MG tablet, Take by mouth., Disp: , Rfl:  .  diclofenac (FLECTOR) 1.3 % PTCH, Place 1 patch onto the skin 2 (two) times daily., Disp: , Rfl:  .  diclofenac Sodium (VOLTAREN) 1 % GEL, APPLY 2 GRAMS TO 4 GRAMS TOPICALLY TO AFFECTED JOINTS UP TO 4 TIMES A DAY, Disp: 400 g, Rfl: 2 .  doxepin (SINEQUAN) 10 MG capsule, Take 10 mg by mouth at bedtime., Disp: , Rfl:  .  DULoxetine (CYMBALTA) 20 MG capsule, TAKE 1 CAPSULE BY MOUTH EVERY OTHER DAY, Disp: 45 capsule, Rfl: 0 .  ENBREL SURECLICK 50 MG/ML injection, Inject 50 mg into the skin once a week.,  Disp: 12 mL, Rfl: 0 .  escitalopram (LEXAPRO) 5 MG tablet, Take 5 mg by mouth at bedtime. , Disp: , Rfl:  .  esomeprazole (NEXIUM) 40 MG capsule, Take 40 mg by mouth every evening. , Disp: , Rfl:  .  estradiol (ESTRACE) 0.1 MG/GM vaginal cream, Place 1 Applicatorful vaginally 3 (three) times a week., Disp: , Rfl:  .  famotidine (PEPCID) 20 MG tablet, Take 40 mg by mouth 2 (two) times daily.  (Patient not taking: Reported on 06/05/2020), Disp: , Rfl:  .  famotidine (PEPCID) 40 MG tablet, Take by mouth., Disp: , Rfl:  .  Fe Fum-FePoly-Vit C-Vit B3 (INTEGRA PO), Take 1 tablet by mouth 2 (two) times daily. , Disp: , Rfl:  .  ferrous  gluconate (FERGON) 324 MG tablet, Take by mouth., Disp: , Rfl:  .  folic acid (FOLVITE) 1 MG tablet, TAKE 1 TABLET BY MOUTH EACH DAY, Disp: 90 tablet, Rfl: 2 .  furosemide (LASIX) 20 MG tablet, Take 20 mg by mouth daily as needed for fluid or edema. , Disp: , Rfl:  .  Gabapentin Enacarbil (HORIZANT) 600 MG TBCR, Take 1 tablet by mouth daily., Disp: , Rfl:  .  Gabapentin Enacarbil ER 300 MG TBCR, Take 300 mg by mouth every evening.  (Patient not taking: Reported on 06/05/2020), Disp: , Rfl:  .  glucose blood (UNISTRIP1 GENERIC) test strip, Inject 1 Units as directed 2 times daily., Disp: , Rfl:  .  hydroxychloroquine (PLAQUENIL) 200 MG tablet, Take 1 tablet in the morning and half tablet in the evening., Disp: 135 tablet, Rfl: 0 .  hydrOXYzine (ATARAX/VISTARIL) 25 MG tablet, Take 25 mg by mouth 3 (three) times daily., Disp: , Rfl:  .  levothyroxine (SYNTHROID, LEVOTHROID) 150 MCG tablet, Take 150 mcg by mouth every evening. , Disp: , Rfl:  .  lidocaine (LIDODERM) 5 %, Place 1 patch onto the skin daily as needed (pain). Remove & Discard patch within 12 hours or as directed by MD, Disp: 30 patch, Rfl: 1 .  LINZESS 290 MCG CAPS capsule, Take 290 mcg by mouth every morning., Disp: , Rfl:  .  meclizine (ANTIVERT) 25 MG tablet, Take 1 tablet (25 mg total) by mouth 3 (three) times daily as needed for dizziness., Disp: 30 tablet, Rfl: 0 .  Meth-Hyo-M Bl-Na Phos-Ph Sal (URIBEL) 118 MG CAPS, Take 1 capsule by mouth daily. , Disp: , Rfl:  .  Methotrexate Sodium (METHOTREXATE, PF,) 50 MG/2ML injection, INJECT 0.6 MLS INTO THE SKIN ONCE A WEEK, Disp: 8 mL, Rfl: 0 .  mometasone (ELOCON) 0.1 % cream, Apply 1 application topically daily., Disp: , Rfl:  .  mometasone (NASONEX) 50 MCG/ACT nasal spray, Place 2 sprays into the nose daily as needed (allergies). , Disp: , Rfl:  .  mometasone-formoterol (DULERA) 200-5 MCG/ACT AERO, Inhale 2 puffs into the lungs 2 (two) times daily., Disp: , Rfl:  .  montelukast (SINGULAIR)  10 MG tablet, Take 10 mg by mouth at bedtime., Disp: , Rfl:  .  Multiple Vitamin (MULTI-VITAMIN PO), Take 1 tablet by mouth daily. , Disp: , Rfl:  .  omeprazole (PRILOSEC) 40 MG capsule, 40 mg., Disp: , Rfl:  .  ondansetron (ZOFRAN) 4 MG tablet, Take 4 mg by mouth every 8 (eight) hours as needed for nausea or vomiting., Disp: , Rfl:  .  OXcarbazepine (TRILEPTAL) 150 MG tablet, Take 1 tablet (150 mg total) by mouth 2 (two) times daily., Disp: 180 tablet, Rfl: 4 .  oxyCODONE-acetaminophen (  PERCOCET) 10-325 MG tablet, Take 1 tablet by mouth every 6 (six) hours as needed for pain. , Disp: , Rfl:  .  pentosan polysulfate (ELMIRON) 100 MG capsule, Take 100 mg by mouth 2 (two) times daily., Disp: , Rfl:  .  pimecrolimus (ELIDEL) 1 % cream, Apply 1 application topically 2 (two) times daily as needed (psoriasis). , Disp: , Rfl:  .  sertraline (ZOLOFT) 50 MG tablet, Take 50 mg by mouth daily., Disp: , Rfl:  .  tamsulosin (FLOMAX) 0.4 MG CAPS capsule, Take 0.4 mg by mouth every evening., Disp: , Rfl:  .  tiZANidine (ZANAFLEX) 4 MG tablet, Take 2 tabs at bedtime as needed.  Please call [phone] to schedule appt for continued refills. (Patient taking differently: Take 8 mg by mouth at bedtime. Please call [phone] to schedule appt for continued refills.), Disp: 60 tablet, Rfl: 0 .  traZODone (DESYREL) 50 MG tablet, Take 100 mg by mouth at bedtime., Disp: , Rfl:  .  TUBERCULIN SYR 1CC/27GX1/2" 27G X 1/2" 1 ML MISC, Use to inject methotrexate once weekly., Disp: 12 each, Rfl: 2 .  Vitamin D, Ergocalciferol, (DRISDOL) 50000 UNITS CAPS capsule, Take 50,000 Units by mouth 2 (two) times a week. , Disp: , Rfl:  .  zolmitriptan (ZOMIG) 5 MG nasal solution, Place 1 spray into the nose daily as needed for migraine., Disp: 6 Units, Rfl: 11  Objective: Patient sounds stable.  They are in no apparent distress.  Breathing is non labored.  Mood and behavior are normal.  Laboratory Data:  No results found for this or any  previous visit (from the past 2160 hour(s)).   Assessment: 45 y.o. female with mild/moderate COVID 19 viral infection diagnosed on 08/05/20 at high risk for progression to severe COVID 19.  Plan:  This patient is a 45 y.o. female that meets the following criteria for Emergency Use Authorization of: Paxlovid 1. Age >12 yr AND > 40 kg 2. SARS-COV-2 positive test 3. Symptom onset < 5 days 4. Mild-to-moderate COVID disease with high risk for severe progression to hospitalization or death  I have spoken and communicated the following to the patient or parent/caregiver regarding: 1. Paxlovid is an unapproved drug that is authorized for use under an Emergency Use Authorization.  2. There are no adequate, approved, available products for the treatment of COVID-19 in adults who have mild-to-moderate COVID-19 and are at high risk for progressing to severe COVID-19, including hospitalization or death. 3. Other therapeutics are currently authorized. For additional information on all products authorized for treatment or prevention of COVID-19, please see [email or web address]-[email or web address]  4. There are benefits and risks of taking this treatment as outlined in the "Fact Sheet for Patients and Caregivers."  5. "Fact Sheet for Patients and Caregivers" was reviewed with patient. A hard copy will be provided to patient from pharmacy prior to the patient receiving treatment. 6. Patients should continue to self-isolate and use infection control measures (e.g., wear mask, isolate, social distance, avoid sharing personal items, clean and disinfect "high touch" surfaces, and frequent handwashing) according to CDC guidelines.  7. The patient or parent/caregiver has the option to accept or refuse treatment. 8. Patient medication history was reviewed for potential drug interactions:No drug interactions 9. Patient's GFR was calculated  to be > 90, and they were therefore prescribed Normal dose (GFR>60) - nirmatrelvir 150mg  tab (2 tablet) by mouth twice daily AND ritonavir 100mg  tab (1 tablet) by mouth twice daily   After reviewing above information with the patient,  the patient agrees to receive Paxlovid.  Follow up instructions:    . Take prescription BID x 5 days as directed . Reach out to pharmacist for counseling on medication if desired . For concerns regarding further COVID symptoms please follow up with your PCP or urgent care . For urgent or life-threatening issues, seek care at your local emergency department  The patient was provided an opportunity to ask questions, and all were answered. The patient agreed with the plan and demonstrated an understanding of the instructions.   Script sent to   The patient was advised to call their PCP or seek an in-person evaluation if the symptoms worsen or if the condition fails to improve as anticipated.   I provided 15 minutes of non face-to-face telephone visit time during this encounter, and > 50% was spent counseling as documented under my assessment & plan.  Mauro Kaufmann, NP 08/07/2020 /10:58 AM  I connected by phone with Michelle [name] on 08/07/2020 at 11:13 AM to discuss the potential use of a new treatment for mild to moderate COVID-19 viral infection in non-hospitalized patients.  This patient is a 45 y.o. female that meets the FDA criteria for Emergency Use Authorization of COVID monoclonal antibody bebtelovimab.  Has a (+) direct SARS-CoV-2 viral test result  Has mild or moderate COVID-19   Is NOT hospitalized due to COVID-19  Is within 10 days of symptom onset  Has at least one of the high risk factor(s) for progression to severe COVID-19 and/or hospitalization as defined in EUA.  Specific high risk criteria : BMI > 25, Diabetes and Chronic Lung Disease   I have spoken and communicated the following to the patient or parent/caregiver regarding  COVID monoclonal antibody treatment:  2. FDA has authorized the emergency use for the treatment of mild to moderate COVID-19 in adults and pediatric patients with positive results of direct SARS-CoV-2 viral testing who are 68 years of age and older weighing at least 40 kg, and who are at high risk for progressing to severe COVID-19 and/or hospitalization.  3. The significant known and potential risks and benefits of COVID monoclonal antibody, and the extent to which such potential risks and benefits are unknown.  4. Information on available alternative treatments and the risks and benefits of those alternatives, including clinical trials.  5. Patients treated with COVID monoclonal antibody should continue to self-isolate and use infection control measures (e.g., wear mask, isolate, social distance, avoid sharing personal items, clean and disinfect "high touch" surfaces, and frequent handwashing) according to CDC guidelines.   6. The patient or parent/caregiver has the option to accept or refuse COVID monoclonal antibody treatment.  7. Discussion about the monoclonal antibody infusion does not ensure treatment. The patient will be placed on a list and scheduled according to risk, symptom onset and availability. A scheduler will reach to the patient to let them know if we can accommodate their infusion or not.  After reviewing this information with the patient, the patient has agreed to receive one of the available covid 19 monoclonal antibodies and will be provided an appropriate fact sheet prior to infusion. Mauro Kaufmann, NP 08/07/2020 11:13 AM

## 2020-08-07 NOTE — Telephone Encounter (Signed)
Called to discuss with patient about COVID-19 symptoms and the use of one of the available treatments for those with mild to moderate Covid symptoms and at a high risk of hospitalization.  Pt appears to qualify for outpatient treatment due to co-morbid conditions and/or a member of an at-risk group in accordance with the FDA Emergency Use Authorization.    Symptom onset: 08/05/20 Cough,fever,headache,diarrhea Vaccinated: Yes Booster? No Immunocompromised? Yes Qualifiers: Psoriatic arthritis, asthma  Pt. Would like to speak with APP.  Esther Hardy

## 2020-08-08 ENCOUNTER — Other Ambulatory Visit: Payer: Self-pay

## 2020-08-08 ENCOUNTER — Ambulatory Visit (INDEPENDENT_AMBULATORY_CARE_PROVIDER_SITE_OTHER): Payer: BC Managed Care – PPO

## 2020-08-08 VITALS — BP 139/83 | HR 61 | Temp 98.1°F | Resp 24

## 2020-08-08 DIAGNOSIS — U071 COVID-19: Secondary | ICD-10-CM

## 2020-08-08 MED ORDER — FAMOTIDINE IN NACL 20-0.9 MG/50ML-% IV SOLN
20.0000 mg | Freq: Once | INTRAVENOUS | Status: AC | PRN
Start: 2020-08-08 — End: 2020-08-08

## 2020-08-08 MED ORDER — SODIUM CHLORIDE 0.9 % IV SOLN: INTRAVENOUS | Status: AC | PRN

## 2020-08-08 MED ORDER — METHYLPREDNISOLONE SODIUM SUCC 125 MG IJ SOLR: 125.0000 mg | Freq: Once | INTRAMUSCULAR | Status: AC | PRN

## 2020-08-08 MED ORDER — BEBTELOVIMAB 175 MG/2 ML IV (EUA)
175.0000 mg | Freq: Once | INTRAMUSCULAR | Status: AC
Start: 2020-08-08 — End: 2020-08-08
  Administered 2020-08-08: 175 mg via INTRAVENOUS

## 2020-08-08 NOTE — Patient Instructions (Signed)

## 2020-08-08 NOTE — Progress Notes (Signed)
Diagnosis: Covid  Provider:  Chilton Greathouse, MD  Procedure: Infusion  IV Type: Peripheral, IV Location: L Forearm  bebtelovimab, Dose: 175mg   Infusion Start Time: 1450  Infusion Stop Time: 1553  Post Infusion IV Care: Observation period completed and Peripheral IV Discontinued  Discharge: Condition: Stable, Destination: Home . AVS provided to patient.   Performed by:  , RN

## 2020-08-13 ENCOUNTER — Other Ambulatory Visit: Payer: Self-pay | Admitting: Physician Assistant

## 2020-08-13 DIAGNOSIS — L4050 Arthropathic psoriasis, unspecified: Secondary | ICD-10-CM

## 2020-08-13 NOTE — Telephone Encounter (Signed)
Patient is scheduled for 08/2020.

## 2020-08-13 NOTE — Telephone Encounter (Signed)
Call patient to get PLQ eye exam.

## 2020-09-11 ENCOUNTER — Other Ambulatory Visit: Payer: Self-pay | Admitting: Physician Assistant

## 2020-09-11 NOTE — Telephone Encounter (Signed)
Next Visit: 11/15/2020  Last Visit: 06/05/2020 telemedicine   Last Fill: 06/13/2020  DX: Psoriatic arthropathy  Current Dose per office note on 06/05/2020: Enbrel 50 mg subcutaneous injections once weekly  Labs: 07/13/2020 RBC 3.66, hemoglobin 11.7, hematocrit 34.2   TB Gold: 06/08/2020 negative (in Care Everywhere)   Okay to refill enbrel?

## 2020-09-18 ENCOUNTER — Other Ambulatory Visit: Payer: Self-pay | Admitting: Physician Assistant

## 2020-09-18 DIAGNOSIS — L4050 Arthropathic psoriasis, unspecified: Secondary | ICD-10-CM

## 2020-09-18 NOTE — Telephone Encounter (Addendum)
Last Visit: 06/05/2020 telemedicine  Next Visit: 11/15/2020 Labs: 07/13/2020 RBC 3.66, Hgb 11.7, Hct 34.2 Eye exam: 02/17/19 WNL  Current Dose per office note 06/05/2020: methotrexate 0.6 mL sq injections once, Plaquenil 200 mg 1 tablet in the morning and half tablet in the evening weekly DX: Psoriatic arthropathy  Last Fill: PLQ and MTX: 06/13/2020  Left message to advise patient we need her updated PLQ eye exam. Patient was scheduled to have it done this month.  Okay to refill MTX and PLQ?

## 2020-09-20 ENCOUNTER — Telehealth: Payer: Self-pay

## 2020-09-20 NOTE — Telephone Encounter (Signed)
Patient called to let Dr. Corliss Skains know that [facility] will be faxing her Plaquenil Eye Exam today.

## 2020-10-05 ENCOUNTER — Telehealth: Payer: Self-pay

## 2020-10-05 NOTE — Telephone Encounter (Signed)
Patient left a voicemail stating she has a bad infection and wants Dr. Fatima Sanger advise.

## 2020-10-05 NOTE — Telephone Encounter (Signed)
Patient states she was bitten by a dog several weeks ago and was prescribed antibiotics. Patient then followed up with the doctor today and was prescribed a second round of antibiotics since the infected bite has not healed. Patient states she took enbrel and MTX last weekend. I have advised patient to hold both MTX and enbrel while on antibiotics and until the infection has resolved. Patient verbalized understanding.

## 2020-10-19 ENCOUNTER — Encounter: Payer: Self-pay | Admitting: Rheumatology

## 2020-10-19 LAB — HM DIABETES EYE EXAM

## 2020-10-31 ENCOUNTER — Telehealth: Payer: Self-pay | Admitting: *Deleted

## 2020-10-31 NOTE — Telephone Encounter (Signed)
Attempted to contact the patient and left message for patient to call the office.  

## 2020-10-31 NOTE — Telephone Encounter (Signed)
Gearldine Bienenstock, PA-C  Michelle Combs, LPN Please clarify if the patient has been taking her medications as prescribed.

## 2020-11-01 ENCOUNTER — Telehealth: Payer: Self-pay | Admitting: Rheumatology

## 2020-11-01 NOTE — Telephone Encounter (Signed)
Patient states she does take her medication as prescribed. Patient did have to hold medication for about 3 weeks due to an infected dog bite.

## 2020-11-01 NOTE — Telephone Encounter (Signed)
Patient called back in reference to a message she noticed on her phone from [place]. Patient states she spoke to [place] earlier today, and did not know if message was from earlier or after they had spoken. Please call patient back if you need anything further post conversation from this afternoon.

## 2020-11-02 NOTE — Telephone Encounter (Signed)
Left message to advise patient the message she received must have been the one I had previously left before actually speaking with her. Advised there was nothing further we needed to discuss.

## 2020-11-15 ENCOUNTER — Other Ambulatory Visit: Payer: Self-pay

## 2020-11-15 ENCOUNTER — Encounter: Payer: Self-pay | Admitting: Rheumatology

## 2020-11-15 ENCOUNTER — Ambulatory Visit: Payer: BC Managed Care – PPO | Admitting: Rheumatology

## 2020-11-15 VITALS — BP 106/68 | HR 80 | Resp 14 | Ht 64.0 in | Wt 141.0 lb

## 2020-11-15 DIAGNOSIS — M5030 Other cervical disc degeneration, unspecified cervical region: Secondary | ICD-10-CM | POA: Diagnosis not present

## 2020-11-15 DIAGNOSIS — Z8639 Personal history of other endocrine, nutritional and metabolic disease: Secondary | ICD-10-CM

## 2020-11-15 DIAGNOSIS — N3010 Interstitial cystitis (chronic) without hematuria: Secondary | ICD-10-CM

## 2020-11-15 DIAGNOSIS — Z79899 Other long term (current) drug therapy: Secondary | ICD-10-CM | POA: Diagnosis not present

## 2020-11-15 DIAGNOSIS — Z9884 Bariatric surgery status: Secondary | ICD-10-CM

## 2020-11-15 DIAGNOSIS — Z13820 Encounter for screening for osteoporosis: Secondary | ICD-10-CM

## 2020-11-15 DIAGNOSIS — Z8709 Personal history of other diseases of the respiratory system: Secondary | ICD-10-CM

## 2020-11-15 DIAGNOSIS — L409 Psoriasis, unspecified: Secondary | ICD-10-CM | POA: Diagnosis not present

## 2020-11-15 DIAGNOSIS — M797 Fibromyalgia: Secondary | ICD-10-CM

## 2020-11-15 DIAGNOSIS — L4050 Arthropathic psoriasis, unspecified: Secondary | ICD-10-CM | POA: Diagnosis not present

## 2020-11-15 DIAGNOSIS — M62838 Other muscle spasm: Secondary | ICD-10-CM

## 2020-11-15 DIAGNOSIS — M5136 Other intervertebral disc degeneration, lumbar region: Secondary | ICD-10-CM

## 2020-11-15 DIAGNOSIS — S92414D Nondisplaced fracture of proximal phalanx of right great toe, subsequent encounter for fracture with routine healing: Secondary | ICD-10-CM

## 2020-11-15 DIAGNOSIS — Z780 Asymptomatic menopausal state: Secondary | ICD-10-CM

## 2020-11-15 DIAGNOSIS — Z8659 Personal history of other mental and behavioral disorders: Secondary | ICD-10-CM

## 2020-11-15 DIAGNOSIS — R5383 Other fatigue: Secondary | ICD-10-CM

## 2020-11-15 NOTE — Patient Instructions (Addendum)
Standing Labs We placed an order today for your standing lab work.   Please have your standing labs drawn in October and every 3 months  If possible, please have your labs drawn 2 weeks prior to your appointment so that the provider can discuss your results at your appointment.  Please note that you may see your imaging and lab results in MyChart before we have reviewed them. We may be awaiting multiple results to interpret others before contacting you. Please allow our office up to 72 hours to thoroughly review all of the results before contacting the office for clarification of your results.  We have open lab daily: Monday through Thursday from 1:30-4:30 PM and Friday from 1:30-4:00 PM at the office of Dr. [name], [facility].   Please be advised, all patients with office appointments requiring lab work will take precedent over walk-in lab work.  If possible, please come for your lab work on Monday and Friday afternoons, as you may experience shorter wait times. The office is located at [address], [address], [place], [place] [address] No appointment is necessary.   Labs are drawn by [facility]. Please bring your co-pay at the time of your lab draw.  You may receive a bill from [facility] for your lab work.  If you wish to have your labs drawn at another location, please call the office 24 hours in advance to send orders.  If you have any questions regarding directions or hours of operation,  please call [phone].   As a reminder, please drink plenty of water prior to coming for your lab work. Thanks!   COVID-19 vaccine recommendations:   COVID-19 vaccine is recommended for everyone (unless you are allergic to a vaccine component), even if you are on a medication that suppresses your immune system.   If you are on Methotrexate, Cellcept (mycophenolate), Rinvoq, Harriette Ohara, and Olumiant- hold the medication for 1 week after each vaccine. Hold Methotrexate for 2 weeks  after the single dose COVID-19 vaccine.   If you are on Orencia subcutaneous injection - hold medication one week prior to and one week after the first COVID-19 vaccine dose (only).   If you are on Orencia IV infusions- time vaccination administration so that the first COVID-19 vaccination will occur four weeks after the infusion and postpone the subsequent infusion by one week.   If you are on Cyclophosphamide or Rituxan infusions please contact your doctor prior to receiving the COVID-19 vaccine.   Do not take Tylenol or any anti-inflammatory medications (NSAIDs) 24 hours prior to the COVID-19 vaccination.   There is no direct evidence about the efficacy of the COVID-19 vaccine in individuals who are on medications that suppress the immune system.   Even if you are fully vaccinated, and you are on any medications that suppress your immune system, please continue to wear a mask, maintain at least six feet social distance and practice hand hygiene.   If you develop a COVID-19 infection, please contact your PCP or our office to determine if you need monoclonal antibody infusion.  The booster vaccine is now available for immunocompromised patients.   Please see the following web sites for updated information.   [email or web address]   Vaccines You are taking a medication(s) that can suppress your immune system.  The following immunizations are recommended: Flu annually Covid-19  Td/Tdap (tetanus, diphtheria, pertussis) every 10 years Pneumonia (Prevnar 15 then Pneumovax 23 at least 1 year apart.  Alternatively, can take Prevnar 20 without needing  additional dose) Shingrix (after age 109): 2 doses from 4 weeks to 6 months apart  Please check with your PCP to make sure you are up to date.   If you test POSITIVE for COVID19 and have MILD to MODERATE symptoms: First, call your PCP if you would like to receive COVID19  treatment AND Hold your medications during the infection and for at least 1 week after your symptoms have resolved: Injectable medication (Benlysta, Cimzia, Cosentyx, Enbrel, Humira, Orencia, Remicade, Simponi, Stelara, Taltz, Tremfya) Methotrexate Leflunomide (Arava) Azathioprine Mycophenolate (Cellcept) Osborne Oman, or Rinvoq Otezla If you take Actemra or Kevzara, you DO NOT need to hold these for COVID19 infection.  If you test POSITIVE for COVID19 and have NO symptoms: First, call your PCP if you would like to receive COVID19 treatment AND Hold your medications for at least 10 days after the day that you tested positive Injectable medication (Benlysta, Cimzia, Cosentyx, Enbrel, Humira, Orencia, Remicade, Simponi, Stelara, Taltz, Tremfya) Methotrexate Leflunomide (Arava) Azathioprine Mycophenolate (Cellcept) Osborne Oman, or Rinvoq Otezla If you take Actemra or Kevzara, you DO NOT need to hold these for COVID19 infection.  If you have signs or symptoms of an infection or start antibiotics: First, call your PCP for workup of your infection. Hold your medication through the infection, until you complete your antibiotics, and until symptoms resolve if you take the following: Injectable medication (Actemra, Benlysta, Cimzia, Cosentyx, Enbrel, Humira, Kevzara, Orencia, Remicade, Simponi, Stelara, Taltz, Tremfya) Methotrexate Leflunomide (Arava) Mycophenolate (Cellcept) Osborne Oman, or Rinvoq   Please get annual skin examination by a dermatologist to screen for nonmelanoma skin cancer   Heart Disease Prevention   Your inflammatory disease increases your risk of heart disease which includes heart attack, stroke, atrial fibrillation (irregular heartbeats), high blood pressure, heart failure and atherosclerosis (plaque in the arteries).  It is important to reduce your risk by:   Keep blood pressure, cholesterol, and blood sugar at healthy levels   Smoking Cessation    Maintain a healthy weight  BMI 20-25   Eat a healthy diet  Plenty of fresh fruit, vegetables, and whole grains  Limit saturated fats, foods high in sodium, and added sugars  DASH and Mediterranean diet   Increase physical activity  Recommend moderate physically activity for 150 minutes per week/ 30 minutes a day for five days a week These can be broken up into three separate ten-minute sessions during the day.   Reduce Stress  Meditation, slow breathing exercises, yoga, coloring books  Dental visits twice a year

## 2020-11-15 NOTE — Progress Notes (Signed)
Office Visit Note  Patient: Michelle [name]             Date of Birth: 1975-08-04           MRN: [id number]             PCP: Lester [place]., MD Referring: Lester ., MD Visit Date: 11/15/2020 Occupation: @GUAROCC @  Subjective:  Psoriasis (Doing good)   History of Present Illness: Michelle [name] is a 45 y.o. female with a history of psoriatic arthritis, psoriasis, osteoarthritis and degenerative disc disease.  She states that she is doing quite well on the combination of current medications.  She denies any joint swelling.  She had a) psoriasis patch which is resolving.  She states that the weekend before July 4 she fractured her right great toe as a plate fell on her foot.  She also had history of rib fractures in the past.  She states those were from the stress fracture.  She was tried on Reclast in the past and had an allergic reaction to it.  She is interested in starting on Prolia.  Activities of Daily Living:  Patient reports morning stiffness for 24 hours.   Patient Denies nocturnal pain.  Difficulty dressing/grooming: Reports Difficulty climbing stairs: Reports Difficulty getting out of chair: Reports Difficulty using hands for taps, buttons, cutlery, and/or writing: Reports  Review of Systems  Constitutional:  Positive for fatigue.  HENT:  Positive for mouth dryness.   Eyes:  Positive for dryness.  Cardiovascular:  Positive for swelling in legs/feet.  Gastrointestinal:  Positive for constipation.  Endocrine: Positive for cold intolerance and heat intolerance.  Musculoskeletal:  Positive for joint pain, gait problem, joint pain, morning stiffness and muscle tenderness. Negative for joint swelling and muscle weakness.  Skin:  Positive for rash.  Allergic/Immunologic: Positive for susceptible to infections.  Neurological:  Positive for numbness and weakness.  Hematological:  Positive for bruising/bleeding tendency.  Psychiatric/Behavioral:  Positive for sleep  disturbance.    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 No pertinent family history. Past Surgical History:  Procedure Laterality Date   ABDOMINAL HYSTERECTOMY     CESAREAN SECTION     CHOLECYSTECTOMY     FACET JOINT INJECTION     GASTRIC BYPASS     SI Joint Injection     tummy tuck  03/24/2018   Social History   Social History Narrative   Patient lives at home alone with her husband and she works full time disability .    Right  handed.   Caffeine one times per day.   Immunization History  Administered Date(s) Administered   Influenza Inj Mdck Quad Pf 02/13/2017   Influenza,inj,Quad PF,6+ Mos 01/23/2016, 01/27/2018   Influenza-Unspecified 01/23/2016   Moderna Sars-Covid-2 Vaccination 04/17/2019, 05/21/2019, 07/24/2020   Pneumococcal Polysaccharide-23 01/20/2012   Tdap 07/28/2012     Objective: Vital Signs: BP 106/68 (BP Location: Left Arm, Patient Position: Sitting, Cuff Size: Normal)   Pulse 80   Resp 14   Ht 5\' 4"  (1.626 m)   Wt 141 lb (64 kg)   BMI 24.20 kg/m    Physical Exam Vitals and nursing  note reviewed.  Constitutional:      Appearance: She is well-developed.  HENT:     Head: Normocephalic and atraumatic.  Eyes:     Conjunctiva/sclera: Conjunctivae normal.  Cardiovascular:     Rate and Rhythm: Normal rate and regular rhythm.     Heart sounds: Normal heart sounds.  Pulmonary:     Effort: Pulmonary effort is normal.     Breath sounds: Normal breath sounds.  Abdominal:     General: Bowel sounds are normal.     Palpations: Abdomen is soft.  Musculoskeletal:     Cervical back: Normal range of motion.  Lymphadenopathy:     Cervical: No cervical adenopathy.  Skin:    General: Skin is warm and dry.     Capillary Refill: Capillary refill takes less than 2 seconds.  Neurological:     Mental Status: She is alert and oriented to person, place, and time.  Psychiatric:        Behavior: Behavior normal.     Musculoskeletal Exam: C-spine was in good range of motion.  Shoulder joints, elbow joints, wrist joints, MCPs and PIPs with good range of motion.  There was no DIP and PIP thickening or synovitis.  Hip joints and knee joints with good range of motion.  She was in a boot due to right toe fracture.  Left ankle and foot was in good range of motion with no tenderness or synovitis.  CDAI Exam: CDAI Score: -- Patient Global: --; Provider Global: -- Swollen: --; Tender: -- Joint Exam 11/15/2020   No joint exam has been documented for this visit   There is currently no information documented on the homunculus. Go to the Rheumatology activity and complete the homunculus joint exam.  Investigation: No additional findings.  Imaging: No results found.  Recent Labs: Lab Results  Component Value Date   WBC 5.6 01/10/2019   HGB 12.5 01/10/2019   PLT 188 01/10/2019   NA 138 01/10/2019   K 4.5 01/10/2019   CL 102 01/10/2019   CO2 27 01/10/2019   GLUCOSE 90 01/10/2019   BUN 5 (L) 01/10/2019   CREATININE 0.60 01/10/2019   BILITOT 0.3 01/10/2019   ALKPHOS 113 10/29/2016    AST 20 01/10/2019   ALT 20 01/10/2019   PROT 6.8 01/10/2019   ALBUMIN 4.3 10/29/2016   CALCIUM 9.1 01/10/2019   GFRAA 128 01/10/2019   QFTBGOLDPLUS NEGATIVE 01/10/2019   October 26, 2020 CBC WBC 5.1 hemoglobin 11.9, platelets 167,  May 31st 2022 CMP normal, vitamin D 44 July 20, 2020 TSH 0.41 (low)  June 08, 2020 TB Gold negative at [facility] Comments: [facility] Eye Exam:  10/19/2020 WNL at [facility]. Follow up 1 year.   Procedures:  No procedures performed Allergies: Zoledronic acid, Duloxetine, Flunisolide, Vilazodone, Latex, and Tape   Assessment / Plan:  Visit Diagnoses: Psoriatic arthropathy (HCC)-she is doing well with no joint pain or joint swelling.  Psoriasis-she had recent psoriasis patch which is resolving.  High risk medication use - Enbrel 50 mg once weekly, methotrexate 0.6 mL sq injections once weekly, folic acid 2 mg, Plaquenil 295 mg 1 tablet in the morning and half tablet in the evenin - Plan: COMPLETE METABOLIC PANEL WITH GFR,  She had recent CBC at [facility].  TB gold was negative on June 08, 2020.  She was advised to stop Enbrel in case she develops an infection.  Instructions were placed in the AVS.  Instructions regarding immunization was also placed in the AVS.  She has been advised to get annual skin examination to screen for nonmelanoma skin cancer.  DDD (degenerative disc disease), cervical-she had good range of motion without discomfort.  DDD (degenerative disc disease), lumbar-she continues to have lower back pain.  Fibromyalgia -she is on cymbalta 20 mg 1 capsule by mouth every other day.  Closed nondisplaced fracture of proximal phalanx of right great toe with routine healing, subsequent encounter-she developed recent fracture from a plate falling on her foot.  She is in a boot.  Other fatigue-she states the fatigue is tolerable currently.  Trapezius muscle spasm-she continues to have some  trapezius spasm.  Stretching exercises were discussed.  History of diabetes mellitus-increased risk of heart disease with psoriatic arthritis was discussed.  Dietary modifications and recommendations regarding exercise were placed in the AVS.  Interstitial cystitis  History of hypothyroidism  History of gastric bypass  History of asthma  History of depression  Osteoporosis screening-patient states that she has history of osteoporosis and stress fracture in the past.  I do not have previous DEXA results to review.  We will schedule DEXA scan.  She states she was given Reclast infusion and had a reaction to it.  She is interested in starting on Prolia.  Will obtain labs today.  Postmenopausal  Orders: Orders Placed This Encounter  Procedures   COMPLETE METABOLIC PANEL WITH GFR   Parathyroid hormone, intact (no Ca)   Serum protein electrophoresis with reflex   No orders of the defined types were placed in this encounter.    Follow-Up Instructions: Return in about 3 months (around 02/15/2021) for Psoriatic arthritis.   [name], MD  Note - This record has been created using Animal nutritionist.  Chart creation errors have been sought, but may not always  have been located. Such creation errors do not reflect on  the standard of medical care.

## 2020-11-20 LAB — COMPLETE METABOLIC PANEL WITH GFR
AG Ratio: 1.8 (calc) (ref 1.0–2.5)
ALT: 41 U/L — ABNORMAL HIGH (ref 6–29)
AST: 34 U/L (ref 10–35)
Albumin: 4.6 g/dL (ref 3.6–5.1)
Alkaline phosphatase (APISO): 95 U/L (ref 31–125)
BUN: 11 mg/dL (ref 7–25)
CO2: 29 mmol/L (ref 20–32)
Calcium: 9 mg/dL (ref 8.6–10.2)
Chloride: 104 mmol/L (ref 98–110)
Creat: 0.56 mg/dL (ref 0.50–0.99)
Globulin: 2.5 g/dL (calc) (ref 1.9–3.7)
Glucose, Bld: 82 mg/dL (ref 65–99)
Potassium: 5 mmol/L (ref 3.5–5.3)
Sodium: 140 mmol/L (ref 135–146)
Total Bilirubin: 0.3 mg/dL (ref 0.2–1.2)
Total Protein: 7.1 g/dL (ref 6.1–8.1)
eGFR: 113 mL/min/{1.73_m2} (ref >=60)

## 2020-11-20 LAB — PROTEIN ELECTROPHORESIS, SERUM, WITH REFLEX
Albumin ELP: 4.5 g/dL (ref 3.8–4.8)
Alpha 1: 0.3 g/dL (ref 0.2–0.3)
Alpha 2: 0.7 g/dL (ref 0.5–0.9)
Beta 2: 0.3 g/dL (ref 0.2–0.5)
Beta Globulin: 0.5 g/dL (ref 0.4–0.6)
Gamma Globulin: 0.9 g/dL (ref 0.8–1.7)
Total Protein: 7.2 g/dL (ref 6.1–8.1)

## 2020-11-20 LAB — PARATHYROID HORMONE, INTACT (NO CA): PTH: 148 pg/mL — ABNORMAL HIGH (ref 16–77)

## 2020-11-20 NOTE — Progress Notes (Signed)
ALT is elevated.  Please advise patient to avoid all NSAIDs and alcohol use.  Parathyroid level is elevated.  Her vitamin D was normal recently.  Please refer her to  endocrinology for evaluation of hyperparathyroidism.

## 2020-11-22 ENCOUNTER — Other Ambulatory Visit: Payer: Self-pay | Admitting: Physician Assistant

## 2020-11-22 ENCOUNTER — Other Ambulatory Visit: Payer: Self-pay | Admitting: Rheumatology

## 2020-11-22 DIAGNOSIS — L4050 Arthropathic psoriasis, unspecified: Secondary | ICD-10-CM

## 2020-11-22 NOTE — Telephone Encounter (Signed)
Next Visit: 02/14/2021  Last Visit: 11/15/2020  Last Fill: 05/09/2020  Dx: Psoriatic arthropathy  Current Dose per office note on 11/15/2020: folic acid 2 mg  Okay to refill Folic Acid?

## 2020-11-22 NOTE — Telephone Encounter (Signed)
Last Visit: 02/14/2021  Next Visit: 11/15/2020  Labs: 10/26/2020 CBC: RBC 3.70, Hgb 11.9, Hct 34.1, Neutrophil Absolute 1.7, CMP 11/15/2020 ALT is elevated.  Eye exam:  10/19/2020 WNL   Current Dose per office note 11/15/2020: Plaquenil 200 mg 1 tablet in the morning and half tablet in the evening  GU:[id number] arthropathy  Last Fill: 09/18/2020  Okay to refill Plaquenil?

## 2020-11-30 ENCOUNTER — Encounter: Payer: Self-pay | Admitting: Rheumatology

## 2020-12-04 ENCOUNTER — Telehealth: Payer: Self-pay | Admitting: *Deleted

## 2020-12-04 NOTE — Telephone Encounter (Signed)
Received DEXA results from [facility].  Date of Scan: 11/30/2020  Lowest T-score: -2.1  BMD: 0.617  Lowest site measured:Left Femoral Neck  Significant changes in BMD and site measured (5% and above):n/a  Current Regimen:Calcium and Vitamin D   Recommendation:Osteopenia. Discuss results at follow up visit.   Reviewed by:Dr. [name]   Next Appointment:  02/14/2021

## 2020-12-14 ENCOUNTER — Telehealth: Payer: Self-pay

## 2020-12-14 NOTE — Telephone Encounter (Signed)
Patient called stating she tested positive for Covid this morning.  Patient states she started developing symptoms yesterday, 12/13/20.  Patient requested a return call to let her know if she needs to discontinue any of her medication that Dr. Corliss Skains prescribes.

## 2020-12-14 NOTE — Telephone Encounter (Signed)
Patient advised of the following:   If you test POSITIVE for COVID19 and have MILD to MODERATE symptoms: First, call your PCP if you would like to receive COVID19 treatment AND Hold your medications during the infection and for at least 1 week after your symptoms have resolved: Injectable medication (Benlysta, Cimzia, Cosentyx, Enbrel, Humira, Orencia, Remicade, Simponi, Stelara, Taltz, Tremfya) Methotrexate Leflunomide (Arava) Azathioprine Mycophenolate (Cellcept) Osborne Oman, or Rinvoq Otezla If you take Actemra or Kevzara, you DO NOT need to hold these for COVID19 infection.  Patient will hold her Enbrel and MTX for week after symptoms resolve.

## 2020-12-28 ENCOUNTER — Telehealth: Payer: Self-pay | Admitting: *Deleted

## 2020-12-28 NOTE — Telephone Encounter (Signed)
Patient contacted the office about her bone density scan. Patient advised we have received results and Dr. Corliss Skains will review in detail at her follow up visit in October. Patient expressed understanding.

## 2021-01-11 ENCOUNTER — Other Ambulatory Visit: Payer: Self-pay | Admitting: Physician Assistant

## 2021-01-11 NOTE — Telephone Encounter (Signed)
Last Visit: 02/14/2021   Next Visit: 11/15/2020   Labs: 10/26/2020 CBC: RBC 3.70, Hgb 11.9, Hct 34.1, Neutrophil Absolute 1.7, CMP 11/15/2020 ALT is elevated.  Current Dose per office note 11/15/2020: Enbrel 50 mg once weekly  ZD:[id number] arthropathy  Last Fill: 09/11/2020  Okay to refill Enbrel?

## 2021-02-13 NOTE — Progress Notes (Deleted)
Office Visit Note  Patient: Michelle [name]             Date of Birth: 1975/06/28           MRN: [id number]             PCP: Lester [place]., MD Referring: Lester [place]., MD Visit Date: 02/14/2021 Occupation: @GUAROCC @  Subjective:    History of Present Illness: Michelle [name] is a 45 y.o. female with history of psoriatic arthritis, fibromyalgia, and DDD.  She is on Enbrel 50 mg sq injections once weekly, methotrexate 0.6 mL sq injections once weekly, folic acid 2 mg, Plaquenil 57 mg 1 tablet in the morning and half tablet in the evening.    PLQ Eye Exam: 10/19/2020 WNL at [facility]. Follow up 1 year.  CBC updated on 10/26/20 and CMP drawn on 11/15/20.  TB gold negative on 06/08/20 and will continue to be monitored yearly.  Discussed the importance of holding enbrel and MTX if she develops signs or symptoms of an infection and to resume once the infection has completely cleared.  Discussed the importance of yearly skin examinations while on enbrel due to the increased risk for non-melanoma skin cancer.  Activities of Daily Living:  Patient reports morning stiffness for *** {minute/hour:19697}.   Patient {ACTIONS;DENIES/REPORTS:21021675::"Denies"} nocturnal pain.  Difficulty dressing/grooming: {ACTIONS;DENIES/REPORTS:21021675::"Denies"} Difficulty climbing stairs: {ACTIONS;DENIES/REPORTS:21021675::"Denies"} Difficulty getting out of chair: {ACTIONS;DENIES/REPORTS:21021675::"Denies"} Difficulty using hands for taps, buttons, cutlery, and/or writing: {ACTIONS;DENIES/REPORTS:21021675::"Denies"}  No Rheumatology ROS completed.   PMFS History:  Patient Active Problem List   Diagnosis Date Noted   Interstitial cystitis 11/24/2016   High risk medication use 11/02/2016   History of depression 11/02/2016   History of asthma 11/02/2016   Prolonged QT interval 11/02/2016   History of gastric bypass 11/02/2016   History of hypothyroidism 11/02/2016   History of renal calcinosis  11/02/2016   Psoriatic arthropathy (HCC) 04/17/2016   Psoriasis 04/17/2016   ANA positive 04/17/2016   Other fatigue 04/17/2016   DDD (degenerative disc disease), cervical 04/17/2016   DDD (degenerative disc disease), lumbar 04/17/2016   History of diabetes mellitus 04/17/2016   History of migraine 04/17/2016   Fatty liver 04/17/2016   Diabetes (HCC)    Depression    Fibromyalgia    Muscle cramps     Past Medical History:  Diagnosis Date   Depression    Diabetes (HCC)    Fibromyalgia    Migraine    Muscle cramps    Psoriasis     No family history on file. Past Surgical History:  Procedure Laterality Date   ABDOMINAL HYSTERECTOMY     CESAREAN SECTION     CHOLECYSTECTOMY     FACET JOINT INJECTION     GASTRIC BYPASS     SI Joint Injection     tummy tuck  03/24/2018   Social History   Social History Narrative   Patient lives at home alone with her husband and she works full time disability .    Right handed.   Caffeine one times per day.   Immunization History  Administered Date(s) Administered   Influenza Inj Mdck Quad Pf 02/13/2017   Influenza,inj,Quad PF,6+ Mos 01/23/2016, 01/27/2018   Influenza-Unspecified 01/23/2016   Moderna Sars-Covid-2 Vaccination 04/17/2019, 05/21/2019, 07/24/2020   Pneumococcal Polysaccharide-23 01/20/2012   Tdap 07/28/2012     Objective: Vital Signs: There were no vitals taken for this visit.   Physical Exam Vitals and nursing note reviewed.  Constitutional:  Appearance: She is well-developed.  HENT:     Head: Normocephalic and atraumatic.  Eyes:     Conjunctiva/sclera: Conjunctivae normal.  Pulmonary:     Effort: Pulmonary effort is normal.  Abdominal:     Palpations: Abdomen is soft.  Musculoskeletal:     Cervical back: Normal range of motion.  Skin:    General: Skin is warm and dry.     Capillary Refill: Capillary refill takes less than 2 seconds.  Neurological:     Mental Status: She is alert and oriented to  person, place, and time.  Psychiatric:        Behavior: Behavior normal.     Musculoskeletal Exam: ***  CDAI Exam: CDAI Score: -- Patient Global: --; Provider Global: -- Swollen: --; Tender: -- Joint Exam 02/14/2021   No joint exam has been documented for this visit   There is currently no information documented on the homunculus. Go to the Rheumatology activity and complete the homunculus joint exam.  Investigation: No additional findings.  Imaging: No results found.  Recent Labs: Lab Results  Component Value Date   WBC 5.6 01/10/2019   HGB 12.5 01/10/2019   PLT 188 01/10/2019   NA 140 11/15/2020   K 5.0 11/15/2020   CL 104 11/15/2020   CO2 29 11/15/2020   GLUCOSE 82 11/15/2020   BUN 11 11/15/2020   CREATININE 0.56 11/15/2020   BILITOT 0.3 11/15/2020   ALKPHOS 113 10/29/2016   AST 34 11/15/2020   ALT 41 (H) 11/15/2020   PROT 7.2 11/15/2020   PROT 7.1 11/15/2020   ALBUMIN 4.3 10/29/2016   CALCIUM 9.0 11/15/2020   GFRAA 128 01/10/2019   QFTBGOLDPLUS NEGATIVE 01/10/2019    Speciality Comments: PLQ Eye Exam:  10/19/2020 WNL at [facility]. Follow up 1 year.   Procedures:  No procedures performed Allergies: Zoledronic acid, Duloxetine, Flunisolide, Vilazodone, Latex, and Tape   Assessment / Plan:     Visit Diagnoses: Psoriatic arthropathy (HCC)  Psoriasis  High risk medication use  DDD (degenerative disc disease), cervical  DDD (degenerative disc disease), lumbar  Fibromyalgia  Closed nondisplaced fracture of proximal phalanx of right great toe with routine healing, subsequent encounter  Other fatigue  Trapezius muscle spasm  History of diabetes mellitus  Interstitial cystitis  History of hypothyroidism  History of gastric bypass  History of asthma  History of depression  History of prolonged Q-T interval  Orders: No orders of the defined types were placed in this encounter.  No orders of the defined types were placed in  this encounter.     Follow-Up Instructions: No follow-ups on file.   Gearldine Bienenstock, PA-C  Note - This record has been created using Dragon software.  Chart creation errors have been sought, but may not always  have been located. Such creation errors do not reflect on  the standard of medical care.

## 2021-02-14 ENCOUNTER — Ambulatory Visit: Payer: BC Managed Care – PPO | Admitting: Physician Assistant

## 2021-02-27 NOTE — Progress Notes (Signed)
Office Visit Note  Patient: Michelle [name]             Date of Birth: 1975-12-31           MRN: [id number]             PCP: Lester [place]., MD Referring: Lester Real., MD Visit Date: 02/28/2021 Occupation: @GUAROCC @  Subjective:  Discuss DEXA results   History of Present Illness: Michelle [name] is a 45 y.o. female with history of psoriatic arthritis, fibromyalgia, and DDD.  She is currently on Enbrel 50 mg sq injections once weekly, methotrexate 0.6 mL sq injections once weekly, folic acid 2 mg by mouth daily, and Plaquenil 200 mg 1 tablet in the morning and half tablet in the evening.  Patient reports that she missed her dose of methotrexate last week due to the pharmacy being out of syringes.  She states that prior to then she was off of her medications in August/September after being diagnosed with COVID-19.  She states that it took several weeks for her joint pain and inflammation to settle down once she had resumed therapy.  She states over the past several weeks has been experiencing increased joint pain and stiffness.  Her morning stiffness has been lasting longer and she has been having increased myalgias and muscle tenderness.  It has been difficult for her to tease out if her symptoms have been due to underlying psoriatic arthritis or fibromyalgia.  She denies any joint swelling at this time.  She denies any Achilles tendinitis or plantar fasciitis.  She has occasional discomfort in both feet especially in her toes.  She experiences occasional discomfort in both SI joints especially the right side.  She continues to follow-up with pain management.  She states about 1 month ago she had a flare of psoriasis on bilateral lower extremities.  She uses calcipotriene cream topically twice daily as needed for management of psoriasis.     Activities of Daily Living:  Patient reports morning stiffness for all day. Patient Reports nocturnal pain.  Difficulty dressing/grooming:  Reports Difficulty climbing stairs: Reports Difficulty getting out of chair: Reports Difficulty using hands for taps, buttons, cutlery, and/or writing: Reports  Review of Systems  Constitutional:  Positive for fatigue.  HENT:  Positive for mouth dryness and nose dryness. Negative for mouth sores.   Eyes:  Positive for pain, itching and dryness. Negative for visual disturbance.  Respiratory:  Negative for cough, hemoptysis and difficulty breathing.   Cardiovascular:  Negative for chest pain, palpitations, hypertension and swelling in legs/feet.  Gastrointestinal:  Positive for constipation. Negative for blood in stool and diarrhea.  Endocrine: Negative for increased urination.  Genitourinary:  Positive for difficulty urinating. Negative for painful urination.  Musculoskeletal:  Positive for joint pain, joint pain, joint swelling, myalgias, morning stiffness, muscle tenderness and myalgias. Negative for muscle weakness.  Skin:  Positive for color change. Negative for pallor, rash, hair loss, nodules/bumps, redness, skin tightness, ulcers and sensitivity to sunlight.  Allergic/Immunologic: Positive for susceptible to infections.  Neurological:  Positive for numbness and headaches. Negative for dizziness and memory loss.  Hematological:  Positive for bruising/bleeding tendency. Negative for swollen glands.  Psychiatric/Behavioral:  Negative for depressed mood, confusion and sleep disturbance. The patient is not nervous/anxious.    PMFS History:  Patient Active Problem List   Diagnosis Date Noted   Interstitial cystitis 11/24/2016   High risk medication use 11/02/2016   History of depression 11/02/2016   History of asthma 11/02/2016  Prolonged QT interval 11/02/2016   History of gastric bypass 11/02/2016   History of hypothyroidism 11/02/2016   History of renal calcinosis 11/02/2016   Psoriatic arthropathy (HCC) 04/17/2016   Psoriasis 04/17/2016   ANA positive 04/17/2016   Other  fatigue 04/17/2016   DDD (degenerative disc disease), cervical 04/17/2016   DDD (degenerative disc disease), lumbar 04/17/2016   History of diabetes mellitus 04/17/2016   History of migraine 04/17/2016   Fatty liver 04/17/2016   Diabetes (HCC)    Depression    Fibromyalgia    Muscle cramps     Past Medical History:  Diagnosis Date   Depression    Diabetes (HCC)    Fibromyalgia    Migraine    Muscle cramps    Psoriasis     History reviewed. No pertinent family history. Past Surgical History:  Procedure Laterality Date   ABDOMINAL HYSTERECTOMY     CESAREAN SECTION     CHOLECYSTECTOMY     FACET JOINT INJECTION     GASTRIC BYPASS     SI Joint Injection     tummy tuck  03/24/2018   Social History   Social History Narrative   Patient lives at home alone with her husband and she works full time disability .    Right handed.   Caffeine one times per day.   Immunization History  Administered Date(s) Administered   Influenza Inj Mdck Quad Pf 02/13/2017   Influenza,inj,Quad PF,6+ Mos 01/23/2016, 01/27/2018   Influenza-Unspecified 01/23/2016   Moderna Sars-Covid-2 Vaccination 04/17/2019, 05/21/2019, 07/24/2020   Pneumococcal Polysaccharide-23 01/20/2012   Tdap 07/28/2012     Objective: Vital Signs: BP 125/83 (BP Location: Left Arm, Patient Position: Sitting, Cuff Size: Normal)   Pulse 74   Ht 5' 4.25" (1.632 m)   Wt 142 lb 9.6 oz (64.7 kg)   BMI 24.29 kg/m    Physical Exam Vitals and nursing note reviewed.  Constitutional:      Appearance: She is well-developed.  HENT:     Head: Normocephalic and atraumatic.  Eyes:     Conjunctiva/sclera: Conjunctivae normal.  Pulmonary:     Effort: Pulmonary effort is normal.  Abdominal:     Palpations: Abdomen is soft.  Musculoskeletal:     Cervical back: Normal range of motion.  Skin:    General: Skin is warm and dry.     Capillary Refill: Capillary refill takes less than 2 seconds.  Neurological:     Mental Status:  She is alert and oriented to person, place, and time.  Psychiatric:        Behavior: Behavior normal.     Musculoskeletal Exam: Generalized hyperalgesia and positive tender points on examination today.  C-spine has painful ROM.  Shoulder joints, elbow joints, wrist joints, MCPs, PIPs, DIPs have good range of motion with no synovitis.  PIP and DIP thickening consistent with osteoarthritis of both hands.  Complete fist formation bilaterally.  Hip joints, knee joints, ankle joints have good range of motion with no discomfort.  No warmth or effusion of knee joints noted.  No tenderness or swelling of ankle joints.  No evidence of Achilles tendinitis or plantar fasciitis.  No tenderness over MTP joints. Tenderness of the right 2nd PIP and left 2nd and 3rd PIP joints.   CDAI Exam: CDAI Score: -- Patient Global: --; Provider Global: -- Swollen: 0 ; Tender: 3  Joint Exam 02/28/2021      Right  Left  PIP 2 (toe)   Tender   Tender  PIP 3 (toe)      Tender     Investigation: No additional findings.  Imaging: No results found.  Recent Labs: Lab Results  Component Value Date   WBC 5.6 01/10/2019   HGB 12.5 01/10/2019   PLT 188 01/10/2019   NA 140 11/15/2020   K 5.0 11/15/2020   CL 104 11/15/2020   CO2 29 11/15/2020   GLUCOSE 82 11/15/2020   BUN 11 11/15/2020   CREATININE 0.56 11/15/2020   BILITOT 0.3 11/15/2020   ALKPHOS 113 10/29/2016   AST 34 11/15/2020   ALT 41 (H) 11/15/2020   PROT 7.2 11/15/2020   PROT 7.1 11/15/2020   ALBUMIN 4.3 10/29/2016   CALCIUM 9.0 11/15/2020   GFRAA 128 01/10/2019   QFTBGOLDPLUS NEGATIVE 01/10/2019    Speciality Comments: PLQ Eye Exam:  10/19/2020 WNL at [place]. Follow up 1 year.   Procedures:  No procedures performed Allergies: Zoledronic acid, Duloxetine, Flunisolide, Vilazodone, Latex, and Tape      Assessment / Plan:     Visit Diagnoses: Psoriatic arthropathy ([place]): She has no synovitis or dactylitis on examination today.   She has been experiencing increased joint stiffness and aching on a daily basis over the past several weeks.  She has been under increased stress since she recently moved and has also contributed to the worsening of her symptoms due to weather changes.  She remains on Enbrel 50 mg sq injections once weekly, methotrexate 0.6 mL sq injections once weekly, Plaquenil 200 mg 1 tablet in the morning and half tablet in the evening.  She missed a dose of methotrexate last week due to the pharmacy being out of syringes but otherwise has been on therapy without any recent interruptions.  She attributes most of her chronic pain due to underlying fibromyalgia.  She continues to follow-up with pain management and takes Percocet every 6 hours as needed for pain relief as well as Belbuca as prescribed.  Her dose of Seroquel was recently increased to improve her insomnia.  She has been experiencing increased nocturnal pain despite her current treatment regimen.  On examination she has some tenderness over the right SI joint.  No evidence of Achilles tendinitis or plantar fasciitis.  She had a recent flare of psoriasis which has since resolved after using clobetasol and calcipotriene topically as needed.  Lab work from 02/21/2021 was reviewed with the patient today in the office: ESR 10 and CRP< 5.  Discussed that most of her discomfort is to underlying fibromyalgia and osteoarthritis.  Discussed the list of natural anti-inflammatories in detail and all questions were addressed.  She was advised to notify us if she continues to experience increased breakthrough pain or if she has increased stiffness or joint swelling going forward.  She will remain on the current treatment regimen.  She will follow-up in the office in 5 months.  Psoriasis - She had a flare of psoriasis on bilateral lower extremities about 1 month ago.  She uses calcipotriene clobetasol cream topically as needed to manage her psoriasis.  A refill of calcipotriene  was sent to the pharmacy today.  She was encouraged to continue on Enbrel and methotrexate as prescribed.  Plan: calcipotriene (DOVONOX) 0.005 % cream  High risk medication use -Enbrel 50 mg sq injections once weekly, methotrexate 0.6 mL sq injections once weekly, folic acid 2 mg a mouth daily, Plaquenil 200 mg 1 tablet in the morning and half tablet in the evening. CBC and CMP were drawn on 02/21/2021.  Her next lab work will be due in February and every 3 months to monitor for drug toxicity.  Standing orders for CBC and CMP placed today.  TB Gold negative on 06/08/2020 and will continue to be monitored yearly.  Future order for TB gold will be placed today.  PLQ Eye Exam: 10/19/2020 WNL at [facility]. Follow up 1 year. Plan: QuantiFERON-TB Gold Plus, CBC with Differential/Platelet, COMPLETE METABOLIC PANEL WITH GFR Diagnosed with COVID-19 in August 2022.  She has not had any other recent infections.  Discussed the importance of holding Enbrel and methotrexate if she develops signs or symptoms of an infection and to resume once the infection has completely cleared. Discussed the importance of yearly skin examinations while on Enbrel due to the increased risk for nonmelanoma skin cancer.  Referral to dermatology was placed today.  Screening for tuberculosis - Future order for TB gold released today. Plan: QuantiFERON-TB Gold Plus  DDD (degenerative disc disease), cervical: Chronic pain.  She continues to experience chronic pain and stiffness in the C-spine.  She has good range of motion on examination today with some discomfort.  No symptoms of radiculopathy at this time.  She has trapezius muscle tension and tenderness bilaterally.  She takes Flexeril or tizanidine as needed for muscle spasms.  DDD (degenerative disc disease), lumbar: She experiences intermittent discomfort in her lower back.  No midline spinal tenderness noted.  She follows up closely with pain management.  Fibromyalgia: She has  generalized hyperalgesia and positive tender points on examination.  She continues to experience myalgias on a daily basis.  Her muscle tenderness and myalgias have been more severe over the past several weeks and she is unsure if this is due to weather changes or being under increased stress due to recently moving.  Discussed the importance of joint protection and muscle strengthening.  She will remain on Cymbalta as prescribed.  She continues to follow-up with pain management.  Other fatigue: Chronic but stable.  Discussed the importance of regular exercise.  Trapezius muscle spasm: She experiences intermittent muscle tension and tenderness bilaterally.  She takes Flexeril 10 mg 3 times daily as needed for muscle spasms and tizanidine 4 mg 2 tablets at bedtime for muscle spasms.  Osteopenia of multiple sites: DEXA ordered for screening of osteoporosis and was performed on 11/30/2020: Methylmalonic BMD 0.617 with T score -2.1.  No comparison.  Vitamin D was 55 on 02/21/2021.  She is currently taking vitamin D 50,000 units twice weekly as well as calcium carbonate 600 mg daily. Discussed the importance of resistive exercises. repeat DEXA will be due in 2 years.  Closed nondisplaced fracture of proximal phalanx of right great toe with routine healing, subsequent encounter  Other medical conditions are listed as follows:   History of diabetes mellitus  Interstitial cystitis  History of hypothyroidism  History of gastric bypass  History of asthma  History of depression   Orders: Orders Placed This Encounter  Procedures   QuantiFERON-TB Gold Plus   CBC with Differential/Platelet   COMPLETE METABOLIC PANEL WITH GFR    Meds ordered this encounter  Medications   calcipotriene (DOVONOX) 0.005 % cream    Sig: Apply 1 application topically 2 (two) times daily as needed (psoriasis).    Dispense:  60 g    Refill:  1      Follow-Up Instructions: Return in about 5 months (around 07/29/2021)  for Psoriatic arthritis, Osteoarthritis, Fibromyalgia, DDD.   Ofilia Neas, PA-C  Note - This  record has been created using Dragon software.  Chart creation errors have been sought, but may not always  have been located. Such creation errors do not reflect on  the standard of medical care.  

## 2021-02-28 ENCOUNTER — Other Ambulatory Visit: Payer: Self-pay

## 2021-02-28 ENCOUNTER — Ambulatory Visit: Payer: BC Managed Care – PPO | Admitting: Physician Assistant

## 2021-02-28 ENCOUNTER — Encounter: Payer: Self-pay | Admitting: Physician Assistant

## 2021-02-28 VITALS — BP 125/83 | HR 74 | Ht 64.25 in | Wt 142.6 lb

## 2021-02-28 DIAGNOSIS — N3010 Interstitial cystitis (chronic) without hematuria: Secondary | ICD-10-CM

## 2021-02-28 DIAGNOSIS — L409 Psoriasis, unspecified: Secondary | ICD-10-CM | POA: Diagnosis not present

## 2021-02-28 DIAGNOSIS — Z79899 Other long term (current) drug therapy: Secondary | ICD-10-CM

## 2021-02-28 DIAGNOSIS — Z111 Encounter for screening for respiratory tuberculosis: Secondary | ICD-10-CM

## 2021-02-28 DIAGNOSIS — Z8639 Personal history of other endocrine, nutritional and metabolic disease: Secondary | ICD-10-CM

## 2021-02-28 DIAGNOSIS — M5030 Other cervical disc degeneration, unspecified cervical region: Secondary | ICD-10-CM | POA: Diagnosis not present

## 2021-02-28 DIAGNOSIS — M797 Fibromyalgia: Secondary | ICD-10-CM

## 2021-02-28 DIAGNOSIS — M8589 Other specified disorders of bone density and structure, multiple sites: Secondary | ICD-10-CM

## 2021-02-28 DIAGNOSIS — M62838 Other muscle spasm: Secondary | ICD-10-CM

## 2021-02-28 DIAGNOSIS — Z9884 Bariatric surgery status: Secondary | ICD-10-CM

## 2021-02-28 DIAGNOSIS — S92414D Nondisplaced fracture of proximal phalanx of right great toe, subsequent encounter for fracture with routine healing: Secondary | ICD-10-CM

## 2021-02-28 DIAGNOSIS — L4050 Arthropathic psoriasis, unspecified: Secondary | ICD-10-CM | POA: Diagnosis not present

## 2021-02-28 DIAGNOSIS — R5383 Other fatigue: Secondary | ICD-10-CM

## 2021-02-28 DIAGNOSIS — Z8709 Personal history of other diseases of the respiratory system: Secondary | ICD-10-CM

## 2021-02-28 DIAGNOSIS — Z8659 Personal history of other mental and behavioral disorders: Secondary | ICD-10-CM

## 2021-02-28 DIAGNOSIS — M5136 Other intervertebral disc degeneration, lumbar region: Secondary | ICD-10-CM

## 2021-02-28 MED ORDER — CALCIPOTRIENE 0.005 % EX CREA: 1.0000 "application " | TOPICAL_CREAM | Freq: Two times a day (BID) | CUTANEOUS | 1 refills | Status: AC | PRN

## 2021-02-28 NOTE — Addendum Note (Signed)
Addended by: Ellen Henri on: 02/28/2021 04:25 PM   Modules accepted: Orders

## 2021-02-28 NOTE — Patient Instructions (Signed)
Standing Labs We placed an order today for your standing lab work.   Please have your standing labs drawn in February and every 3 months   If possible, please have your labs drawn 2 weeks prior to your appointment so that the provider can discuss your results at your appointment.  Please note that you may see your imaging and lab results in MyChart before we have reviewed them. We may be awaiting multiple results to interpret others before contacting you. Please allow our office up to 72 hours to thoroughly review all of the results before contacting the office for clarification of your results.  We have open lab daily: Monday through Thursday from 1:30-4:30 PM and Friday from 1:30-4:00 PM at the office of Dr. Shaili Deveshwar, [facility].   Please be advised, all patients with office appointments requiring lab work will take precedent over walk-in lab work.  If possible, please come for your lab work on Monday and Friday afternoons, as you may experience shorter wait times. The office is located at [address], [address], [place], [place] [address] No appointment is necessary.   Labs are drawn by [facility]. Please bring your co-pay at the time of your lab draw.  You may receive a bill from [facility] for your lab work.  If you wish to have your labs drawn at another location, please call the office 24 hours in advance to send orders.  If you have any questions regarding directions or hours of operation,  please call [phone].   As a reminder, please drink plenty of water prior to coming for your lab work. Thanks!  

## 2021-04-01 ENCOUNTER — Other Ambulatory Visit: Payer: Self-pay | Admitting: Physician Assistant

## 2021-04-01 DIAGNOSIS — L4050 Arthropathic psoriasis, unspecified: Secondary | ICD-10-CM

## 2021-04-01 NOTE — Telephone Encounter (Signed)
Next Visit: 08/01/2020  Last Visit: 02/28/2021  Labs: 02/21/2021 WBC 3.82, RBC 12.1, Hct 35.7  Eye exam: 10/19/2020 WNL    Current Dose per office note 02/28/2021:  Plaquenil 200 mg 1 tablet in the morning and half tablet in the evening.  DX: Psoriatic arthropathy   Last Fill: 11/22/2020  Okay to refill Plaquenil?

## 2021-04-09 ENCOUNTER — Other Ambulatory Visit: Payer: Self-pay | Admitting: Rheumatology

## 2021-04-09 NOTE — Telephone Encounter (Signed)
Next Visit: 08/01/2021  Last Visit: 02/28/2021  Last Fill: 01/11/2021  BE:[id number] arthropathy   Current Dose per office note 02/28/2021: Enbrel 50 mg sq injections once weekly  Labs: 03/26/2021 CBC/BMP: Potassium 5.4, CO2 31, RBC 3.79, Hgb 11.8, Hct 35.4  TB Gold: 06/08/2020 neg   Okay to refill Enbrel?

## 2021-05-30 ENCOUNTER — Telehealth: Payer: Self-pay

## 2021-05-30 NOTE — Telephone Encounter (Signed)
PA renewal initiated automatically by CoverMyMeds.  Submitted a Prior Authorization request to [employer] for ENBREL via CoverMyMeds. Will update once we receive a response.   Key: [id number]

## 2021-05-31 ENCOUNTER — Other Ambulatory Visit: Payer: Self-pay | Admitting: Physician Assistant

## 2021-05-31 DIAGNOSIS — L4050 Arthropathic psoriasis, unspecified: Secondary | ICD-10-CM

## 2021-05-31 NOTE — Telephone Encounter (Signed)
Received notification from [employer] regarding a prior authorization for ENBREL. Authorization has been APPROVED from 04/30/21 to 05/30/22.   Patient can continue to fill through [facility]: [phone]  Authorization # [id number]  [name], PharmD, MPH, BCPS Clinical Pharmacist (Rheumatology and Pulmonology)

## 2021-07-04 ENCOUNTER — Other Ambulatory Visit: Payer: Self-pay | Admitting: Physician Assistant

## 2021-07-04 NOTE — Telephone Encounter (Signed)
Next Visit: 08/01/2021 ?  ?Last Visit: 02/28/2021 ?  ?Last Fill: 04/01/2021 ? ?[id number] arthropathy  ?  ?Current Dose per office note 02/28/2021: Plaquenil 200 mg 1 tablet in the morning and half tablet in the evening. ? ?Labs: 03/26/2021 CBC/BMP: Potassium 5.4, CO2 31, RBC 3.79, Hgb 11.8, Hct 35.4 ? ?PLQ Eye Exam:  10/19/2020 WNL ? ?Okay to refill PLQ?  ?

## 2021-07-11 NOTE — Progress Notes (Signed)
? ?Office Visit Note ? ?Patient: Michelle [name]             ?Date of Birth: [birth year]           ?MRN: [id number]             ?PCP: Houston Siren., MD ?Referring: Houston Siren., MD ?Visit Date: 07/25/2021 ?Occupation: @GUAROCC @ ? ?Subjective:  ?Pain and stiffness in multiple joints ? ?History of Present Illness: Michelle [name] is a 46 y.o. female with history of psoriatic arthritis and psoriasis.  She states she continues to have morning stiffness lasting for several hours.  She continues to have some discomfort in her hands and her feet.  She has been having discomfort in her neck and gets cortisone injections.  She also has lower back pain.  She has generalized pain and discomfort from fibromyalgia.  She states she has goals for massage therapy which has been helpful. ? ?Activities of Daily Living:  ?Patient reports morning stiffness for all day. ?Patient Reports nocturnal pain.  ?Difficulty dressing/grooming: Reports ?Difficulty climbing stairs: Reports ?Difficulty getting out of chair: Reports ?Difficulty using hands for taps, buttons, cutlery, and/or writing: Reports ? ?Review of Systems  ?Constitutional:  Positive for fatigue.  ?HENT:  Positive for mouth dryness and nose dryness. Negative for mouth sores.   ?Eyes:  Positive for itching and dryness.  ?Respiratory:  Negative for difficulty breathing.   ?Cardiovascular:  Negative for chest pain and palpitations.  ?Gastrointestinal:  Negative for blood in stool, constipation and diarrhea.  ?Endocrine: Positive for increased urination.  ?Musculoskeletal:  Positive for joint pain, joint pain, joint swelling, myalgias, morning stiffness, muscle tenderness and myalgias.  ?Skin:  Positive for color change. Negative for rash and redness.  ?Allergic/Immunologic: Positive for susceptible to infections.  ?Neurological:  Positive for numbness, headaches and weakness. Negative for dizziness and memory loss.  ?Hematological:  Positive for bruising/bleeding tendency.   ?Psychiatric/Behavioral:  Negative for confusion.   ? ?PMFS History:  ?Patient Active Problem List  ? Diagnosis Date Noted  ? Interstitial cystitis 11/24/2016  ? High risk medication use 11/02/2016  ? History of depression 11/02/2016  ? History of asthma 11/02/2016  ? Prolonged QT interval 11/02/2016  ? 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 History of hypothyroidism 11/02/2016  ? 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 Psoriatic arthropathy ([place]) 04/17/2016  ? Psoriasis 04/17/2016  ? ANA positive 04/17/2016  ? Other fatigue 04/17/2016  ? DDD (degenerative disc disease), cervical 04/17/2016  ? DDD (degenerative disc disease), lumbar 04/17/2016  ? History of diabetes mellitus 04/17/2016  ? History of migraine 04/17/2016  ? Fatty liver 04/17/2016  ? Diabetes ([place])   ? Depression   ? Fibromyalgia   ? Muscle cramps   ?  ?Past Medical History:  ?Diagnosis Date  ? Depression   ? Diabetes ([place])   ? Fibromyalgia   ? Migraine   ? Muscle cramps   ? Psoriasis   ?  ?History reviewed. No pertinent family history. ?Past Surgical History:  ?Procedure Laterality Date  ? ABDOMINAL HYSTERECTOMY    ? CESAREAN SECTION    ? CHOLECYSTECTOMY    ? FACET JOINT INJECTION    ? GASTRIC BYPASS    ? SI Joint Injection    ? tummy tuck  03/24/2018  ? ?Social History  ? ?Social History Narrative  ? Patient lives at home alone with her husband and she works full time disability .   ? Right handed.  ? Caffeine one times  per day.  ? ?Immunization History  ?Administered Date(s) Administered  ? Influenza Inj Mdck Quad Pf 02/13/2017  ? Influenza,inj,Quad PF,6+ Mos 01/23/2016, 01/27/2018  ? Influenza-Unspecified 01/23/2016  ? Moderna Sars-Covid-2 Vaccination 04/17/2019, 05/21/2019, 07/24/2020  ? Pneumococcal Polysaccharide-23 01/20/2012  ? Tdap 07/28/2012  ?  ? ?Objective: ?Vital Signs: BP 121/84 (BP Location: Left Arm, Patient Position: Sitting, Cuff Size: Normal)   Pulse 74   Ht 5\' 4"  (1.626 m)   Wt 150 lb 6.4 oz (68.2 kg)   BMI 25.82 kg/m?    ? ?Physical Exam ?Vitals and nursing note reviewed.  ?Constitutional:   ?   Appearance: She is well-developed.  ?HENT:  ?   Head: Normocephalic and atraumatic.  ?Eyes:  ?   Conjunctiva/sclera: Conjunctivae normal.  ?Cardiovascular:  ?   Rate and Rhythm: Normal rate and regular rhythm.  ?   Heart sounds: Normal heart sounds.  ?Pulmonary:  ?   Effort: Pulmonary effort is normal.  ?   Breath sounds: Normal breath sounds.  ?Abdominal:  ?   General: Bowel sounds are normal.  ?   Palpations: Abdomen is soft.  ?Musculoskeletal:  ?   Cervical back: Normal range of motion.  ?Lymphadenopathy:  ?   Cervical: No cervical adenopathy.  ?Skin: ?   General: Skin is warm and dry.  ?   Capillary Refill: Capillary refill takes less than 2 seconds.  ?Neurological:  ?   Mental Status: She is alert and oriented to person, place, and time.  ?Psychiatric:     ?   Behavior: Behavior normal.  ?  ? ?Musculoskeletal Exam: She has stiffness with range of motion of her cervical spine.  She had discomfort range of motion of her lumbar spine.  Shoulder joints with good range of motion with some discomfort.  Elbow joints in good range of motion.  No synovitis was noted over MCPs PIPs or DIPs.  Hip joints with good range of motion with tenderness over trochanteric bursa.  Knee joints with good range of motion.  There was no tenderness over ankles or MTPs. ? ?CDAI Exam: ?CDAI Score: -- ?Patient Global: --; Provider Global: -- ?Swollen: --; Tender: -- ?Joint Exam 07/25/2021  ? ?No joint exam has been documented for this visit  ? ?There is currently no information documented on the homunculus. Go to the Rheumatology activity and complete the homunculus joint exam. ? ?Investigation: ?No additional findings. ? ?Imaging: ?No results found. ? ?Recent Labs: ?Lab Results  ?Component Value Date  ? WBC 5.6 01/10/2019  ? HGB 12.5 01/10/2019  ? PLT 188 01/10/2019  ? NA 140 11/15/2020  ? K 5.0 11/15/2020  ? CL 104 11/15/2020  ? CO2 29 11/15/2020  ? GLUCOSE 82  11/15/2020  ? BUN 11 11/15/2020  ? CREATININE 0.56 11/15/2020  ? BILITOT 0.3 11/15/2020  ? ALKPHOS 113 10/29/2016  ? AST 34 11/15/2020  ? ALT 41 (H) 11/15/2020  ? PROT 7.2 11/15/2020  ? PROT 7.1 11/15/2020  ? ALBUMIN 4.3 10/29/2016  ? CALCIUM 9.0 11/15/2020  ? GFRAA 128 01/10/2019  ? QFTBGOLDPLUS NEGATIVE 01/10/2019  ? ?04/10/21 CBC normal, CMP normal ? ? ?Speciality Comments: PLQ Eye Exam:  10/19/2020 WNL at [facility]. Follow up 1 year.  ? ?Procedures:  ?No procedures performed ?Allergies: Zoledronic acid, Duloxetine, Flunisolide, Vilazodone, Latex, and Tape  ? ?Assessment / Plan:     ?Visit Diagnoses: Psoriatic arthropathy (HCC)-she complains of pain and discomfort in hands and feet.  No synovitis was noted.  She  complains of a stiffness lasting almost all day.  She has been taking Enbrel, methotrexate, Plaquenil on a regular basis.  She states if she stops any of the medications her symptoms get worse..  X-rays of bilateral hands were obtained today which showed generalized osteopenia and osteoarthritis.  X-rays of bilateral feet were obtained today.  X-rays showed bilateral osteopenia and osteoarthritis.  Bilateral inferior calcaneal spurs were noted.  No erosive changes were noted. ? ?Psoriasis-she had no active lesions of psoriasis. ? ?High risk medication use - Enbrel 50 mg sq injections once weekly, methotrexate 0.6 mL sq injections once weekly, folic acid 2 mg a mouth daily, Plaquenil 200 mg 1 tablet in the morning.  April 10, 2021 CBC was normal and CMP was normal.  Will obtain TB Gold, CBC and CMP today.  Information regarding radiation was placed in the AVS.  She was advised to hold Enbrel and methotrexate in case she develops an infection and resume after the infection resolves.  Annual skin examination by dermatologist was advised while she is on Enbrel to screen for skin cancer. ? ?DDD (degenerative disc disease), cervical-she is followed by spine specialist.  She states she gets frequent  cortisone injections.  She has chronic neck discomfort. ? ?DDD (degenerative disc disease), lumbar-she complains of chronic back pain. ? ?Fibromyalgia-she continues to have generalized pain and discomfort from fib

## 2021-07-25 ENCOUNTER — Ambulatory Visit (INDEPENDENT_AMBULATORY_CARE_PROVIDER_SITE_OTHER): Payer: BC Managed Care – PPO

## 2021-07-25 ENCOUNTER — Encounter: Payer: Self-pay | Admitting: Rheumatology

## 2021-07-25 ENCOUNTER — Ambulatory Visit: Payer: BC Managed Care – PPO | Admitting: Rheumatology

## 2021-07-25 VITALS — BP 121/84 | HR 74 | Ht 64.0 in | Wt 150.4 lb

## 2021-07-25 DIAGNOSIS — N3010 Interstitial cystitis (chronic) without hematuria: Secondary | ICD-10-CM

## 2021-07-25 DIAGNOSIS — Z79899 Other long term (current) drug therapy: Secondary | ICD-10-CM | POA: Diagnosis not present

## 2021-07-25 DIAGNOSIS — Z8709 Personal history of other diseases of the respiratory system: Secondary | ICD-10-CM

## 2021-07-25 DIAGNOSIS — L409 Psoriasis, unspecified: Secondary | ICD-10-CM

## 2021-07-25 DIAGNOSIS — L4059 Other psoriatic arthropathy: Secondary | ICD-10-CM

## 2021-07-25 DIAGNOSIS — M5136 Other intervertebral disc degeneration, lumbar region: Secondary | ICD-10-CM

## 2021-07-25 DIAGNOSIS — M8589 Other specified disorders of bone density and structure, multiple sites: Secondary | ICD-10-CM

## 2021-07-25 DIAGNOSIS — M797 Fibromyalgia: Secondary | ICD-10-CM

## 2021-07-25 DIAGNOSIS — M51369 Other intervertebral disc degeneration, lumbar region without mention of lumbar back pain or lower extremity pain: Secondary | ICD-10-CM

## 2021-07-25 DIAGNOSIS — M5030 Other cervical disc degeneration, unspecified cervical region: Secondary | ICD-10-CM

## 2021-07-25 DIAGNOSIS — L4050 Arthropathic psoriasis, unspecified: Secondary | ICD-10-CM

## 2021-07-25 DIAGNOSIS — R5383 Other fatigue: Secondary | ICD-10-CM

## 2021-07-25 DIAGNOSIS — M79672 Pain in left foot: Secondary | ICD-10-CM

## 2021-07-25 DIAGNOSIS — M62838 Other muscle spasm: Secondary | ICD-10-CM

## 2021-07-25 DIAGNOSIS — Z8639 Personal history of other endocrine, nutritional and metabolic disease: Secondary | ICD-10-CM

## 2021-07-25 DIAGNOSIS — S92414D Nondisplaced fracture of proximal phalanx of right great toe, subsequent encounter for fracture with routine healing: Secondary | ICD-10-CM

## 2021-07-25 DIAGNOSIS — M79671 Pain in right foot: Secondary | ICD-10-CM

## 2021-07-25 DIAGNOSIS — M79641 Pain in right hand: Secondary | ICD-10-CM

## 2021-07-25 DIAGNOSIS — Z9884 Bariatric surgery status: Secondary | ICD-10-CM

## 2021-07-25 DIAGNOSIS — M79642 Pain in left hand: Secondary | ICD-10-CM

## 2021-07-25 DIAGNOSIS — Z8659 Personal history of other mental and behavioral disorders: Secondary | ICD-10-CM

## 2021-07-25 NOTE — Patient Instructions (Signed)
Standing Labs ?We placed an order today for your standing lab work.  ? ?Please have your standing labs drawn in July and every 3 months ? ?If possible, please have your labs drawn 2 weeks prior to your appointment so that the provider can discuss your results at your appointment. ? ?Please note that you may see your imaging and lab results in MyChart before we have reviewed them. ?We may be awaiting multiple results to interpret others before contacting you. ?Please allow our office up to 72 hours to thoroughly review all of the results before contacting the office for clarification of your results. ? ?We have open lab daily: ?Monday through Thursday from 1:30-4:30 PM and Friday from 1:30-4:00 PM ?at the office of Dr. [name], [facility].   ?Please be advised, all patients with office appointments requiring lab work will take precedent over walk-in lab work.  ?If possible, please come for your lab work on Monday and Friday afternoons, as you may experience shorter wait times. ?The office is located at [address], [address], [place], [place] [address] ?No appointment is necessary.   ?Labs are drawn by [facility]. Please bring your co-pay at the time of your lab draw.  You may receive a bill from [facility] for your lab work. ? ?Please note if you are on Hydroxychloroquine and and an order has been placed for a Hydroxychloroquine level, you will need to have it drawn 4 hours or more after your last dose. ? ?If you wish to have your labs drawn at another location, please call the office 24 hours in advance to send orders. ? ?If you have any questions regarding directions or hours of operation,  ?please call [phone].   ?As a reminder, please drink plenty of water prior to coming for your lab work. Thanks!  ? ?Vaccines ?You are taking a medication(s) that can suppress your immune system.  The following immunizations are recommended: ?Flu annually ?Covid-19  ?Td/Tdap (tetanus, diphtheria, pertussis)  every 10 years ?Pneumonia (Prevnar 15 then Pneumovax 23 at least 1 year apart.  Alternatively, can take Prevnar 20 without needing additional dose) ?Shingrix: 2 doses from 4 weeks to 6 months apart ? ?Please check with your PCP to make sure you are up to date.  ?If you have signs or symptoms of an infection or start antibiotics: ?First, call your PCP for workup of your infection. ?Hold your medication through the infection, until you complete your antibiotics, and until symptoms resolve if you take the following: ?Injectable medication (Actemra, Benlysta, Cimzia, Cosentyx, Enbrel, Humira, Kevzara, Orencia, Remicade, Simponi, [place], [place], [place]) ?Methotrexate ?Leflunomide Ranae Plumber) ?Mycophenolate (Cellcept) ?[place]borne [place], or Rinvoq  ? ?Please get annual skin examination by dermatologist while you are on Enbrel to screen for skin cancer. ?

## 2021-07-28 LAB — COMPLETE METABOLIC PANEL WITH GFR
AG Ratio: 1.9 (calc) (ref 1.0–2.5)
ALT: 16 U/L (ref 6–29)
AST: 25 U/L (ref 10–35)
Albumin: 4.5 g/dL (ref 3.6–5.1)
Alkaline phosphatase (APISO): 89 U/L (ref 31–125)
BUN/Creatinine Ratio: 9 (calc) (ref 6–22)
BUN: 6 mg/dL — ABNORMAL LOW (ref 7–25)
CO2: 29 mmol/L (ref 20–32)
Calcium: 9.2 mg/dL (ref 8.6–10.2)
Chloride: 98 mmol/L (ref 98–110)
Creat: 0.66 mg/dL (ref 0.50–0.99)
Globulin: 2.4 g/dL (calc) (ref 1.9–3.7)
Glucose, Bld: 117 mg/dL — ABNORMAL HIGH (ref 65–99)
Potassium: 4.9 mmol/L (ref 3.5–5.3)
Sodium: 133 mmol/L — ABNORMAL LOW (ref 135–146)
Total Bilirubin: 0.3 mg/dL (ref 0.2–1.2)
Total Protein: 6.9 g/dL (ref 6.1–8.1)
eGFR: 109 mL/min/{1.73_m2} (ref >=60)

## 2021-07-28 LAB — CBC WITH DIFFERENTIAL/PLATELET
Absolute Monocytes: 583 cells/uL (ref 200–950)
Basophils Absolute: 28 cells/uL (ref 0–200)
Basophils Relative: 0.5 %
Eosinophils Absolute: 341 cells/uL (ref 15–500)
Eosinophils Relative: 6.2 %
HCT: 35.8 % (ref 35.0–45.0)
Hemoglobin: 12.1 g/dL (ref 11.7–15.5)
Lymphs Abs: 2206 cells/uL (ref 850–3900)
MCH: 31 pg (ref 27.0–33.0)
MCHC: 33.8 g/dL (ref 32.0–36.0)
MCV: 91.8 fL (ref 80.0–100.0)
MPV: 10.1 fL (ref 7.5–12.5)
Monocytes Relative: 10.6 %
Neutro Abs: 2343 cells/uL (ref 1500–7800)
Neutrophils Relative %: 42.6 %
Platelets: 234 10*3/uL (ref 140–400)
RBC: 3.9 10*6/uL (ref 3.80–5.10)
RDW: 12.1 % (ref 11.0–15.0)
Total Lymphocyte: 40.1 %
WBC: 5.5 10*3/uL (ref 3.8–10.8)

## 2021-07-28 LAB — QUANTIFERON-TB GOLD PLUS
Mitogen-NIL: >10 IU/mL
NIL: 0.02 IU/mL
QuantiFERON-TB Gold Plus: NEGATIVE
TB1-NIL: 0.01 IU/mL
TB2-NIL: 0.01 IU/mL

## 2021-08-01 ENCOUNTER — Ambulatory Visit: Payer: BC Managed Care – PPO | Admitting: Rheumatology

## 2021-08-12 ENCOUNTER — Other Ambulatory Visit: Payer: Self-pay | Admitting: Physician Assistant

## 2021-08-12 NOTE — Telephone Encounter (Signed)
Next Visit: 12/26/2021 ? ?Last Visit: 07/25/2021 ? ?Last Fill: 11/22/2020 ? ?Dx: Psoriatic arthropathy  ? ?Current Dose per office note on 07/25/2021: folic acid 2 mg a mouth daily ? ?Okay to refill Folic Acid?   ?

## 2021-09-05 ENCOUNTER — Other Ambulatory Visit: Payer: Self-pay | Admitting: Physician Assistant

## 2021-09-05 DIAGNOSIS — L4050 Arthropathic psoriasis, unspecified: Secondary | ICD-10-CM

## 2021-09-05 NOTE — Telephone Encounter (Signed)
Next Visit: 12/26/2021   Last Visit: 07/25/2021   Last Fill: 07/04/2021  Dx: Psoriatic arthropathy    Current Dose per office note on 07/25/2021: Plaquenil 200 mg 1 tablet in the morning  Labs:09/04/2021 BUN 6, RBC 3.76, Hgb 11.8, Hct 34.5  PLQ Eye Exam:  10/19/2020 WNL   Spoke with patient and she states she is taking PLQ one tablet in the morning and half a tablet at night.   Okay to refill PLQ?

## 2021-09-05 NOTE — Telephone Encounter (Signed)
Attempted to contact the patient and left message for patient to call the office. Need to know if she would prefer to switch to tablets or call other pharmacies to see who has the MTX available.

## 2021-09-06 NOTE — Telephone Encounter (Signed)
Attempted to contact the patient and left message for patient to call the office.  

## 2021-09-17 ENCOUNTER — Other Ambulatory Visit: Payer: Self-pay | Admitting: Rheumatology

## 2021-09-17 NOTE — Telephone Encounter (Signed)
Patient left a voicemail requesting a return call regarding her Methotrexate refill.

## 2021-09-18 MED ORDER — METHOTREXATE 2.5 MG PO TABS: 15.0000 mg | ORAL_TABLET | ORAL | 0 refills | Status: DC

## 2021-09-18 NOTE — Telephone Encounter (Signed)
Patient is currently on MTX vial and syringe. Pharmacy is having trouble getting it in. Patient would prefer to switch to tablets due to this.   Next Visit: 12/26/2021  Last Visit: 07/25/2021  DX: Psoriatic arthropathy   Current Dose per office note 07/25/2021: methotrexate 0.6 mL sq injections once weekly  Labs: 09/04/2021 RBC 3.76, Hgb 11.8, Hct 34.5, BUN 6  Okay to refill switch MTX to tablets?

## 2021-10-07 ENCOUNTER — Other Ambulatory Visit: Payer: Self-pay | Admitting: Physician Assistant

## 2021-10-07 DIAGNOSIS — L4050 Arthropathic psoriasis, unspecified: Secondary | ICD-10-CM

## 2021-11-18 ENCOUNTER — Other Ambulatory Visit: Payer: Self-pay | Admitting: Physician Assistant

## 2021-11-18 NOTE — Telephone Encounter (Signed)
Please clarify if the patient wants tablets or injectable MTX

## 2021-11-18 NOTE — Telephone Encounter (Signed)
Next Visit: 12/26/2021  Last Visit: 07/25/2021  Last Fill: 09/18/2021  DX: Psoriatic arthropathy   Current Dose per office note 07/25/2021: methotrexate 0.6 mL sq injections once weekly  Labs: 09/04/2021 RBC 3.76, Hgb 11.8, Hct 34.5, BUN 6  Attempted to contact the patient and left message for patient to call the office.   Okay to refill MTX?

## 2021-12-06 ENCOUNTER — Other Ambulatory Visit: Payer: Self-pay | Admitting: Physician Assistant

## 2021-12-06 DIAGNOSIS — L4050 Arthropathic psoriasis, unspecified: Secondary | ICD-10-CM

## 2021-12-06 NOTE — Telephone Encounter (Signed)
Next Visit: 12/26/2021   Last Visit: 07/25/2021   Last Fill: 09/05/2021   Dx: Psoriatic arthropathy    Current Dose per office note on 07/25/2021: Plaquenil 200 mg 1 tablet in the morning   Labs: 11/27/2021 RBC 3.77, Hgb 12.1, Hct 34.2, BUN 4, Calcium 8.4   PLQ Eye Exam:  10/19/2020 WNL    Spoke with patient and she states she is taking PLQ one tablet in the morning and half a tablet at night.   Patient advised she is due to update her PLQ eye exam.    Okay to refill PLQ?

## 2021-12-26 ENCOUNTER — Ambulatory Visit: Payer: BC Managed Care – PPO | Admitting: Physician Assistant

## 2022-01-03 ENCOUNTER — Ambulatory Visit: Payer: BC Managed Care – PPO | Attending: Physician Assistant | Admitting: Physician Assistant

## 2022-01-03 ENCOUNTER — Encounter: Payer: Self-pay | Admitting: Physician Assistant

## 2022-01-03 ENCOUNTER — Other Ambulatory Visit: Payer: Self-pay

## 2022-01-03 VITALS — BP 119/84 | HR 77 | Resp 16 | Ht 64.0 in | Wt 148.2 lb

## 2022-01-03 DIAGNOSIS — S92414D Nondisplaced fracture of proximal phalanx of right great toe, subsequent encounter for fracture with routine healing: Secondary | ICD-10-CM

## 2022-01-03 DIAGNOSIS — L409 Psoriasis, unspecified: Secondary | ICD-10-CM | POA: Diagnosis not present

## 2022-01-03 DIAGNOSIS — M8589 Other specified disorders of bone density and structure, multiple sites: Secondary | ICD-10-CM

## 2022-01-03 DIAGNOSIS — M797 Fibromyalgia: Secondary | ICD-10-CM

## 2022-01-03 DIAGNOSIS — Z8659 Personal history of other mental and behavioral disorders: Secondary | ICD-10-CM

## 2022-01-03 DIAGNOSIS — Z9884 Bariatric surgery status: Secondary | ICD-10-CM

## 2022-01-03 DIAGNOSIS — R5383 Other fatigue: Secondary | ICD-10-CM

## 2022-01-03 DIAGNOSIS — N3010 Interstitial cystitis (chronic) without hematuria: Secondary | ICD-10-CM

## 2022-01-03 DIAGNOSIS — L4050 Arthropathic psoriasis, unspecified: Secondary | ICD-10-CM | POA: Diagnosis not present

## 2022-01-03 DIAGNOSIS — M5136 Other intervertebral disc degeneration, lumbar region: Secondary | ICD-10-CM

## 2022-01-03 DIAGNOSIS — M51369 Other intervertebral disc degeneration, lumbar region without mention of lumbar back pain or lower extremity pain: Secondary | ICD-10-CM

## 2022-01-03 DIAGNOSIS — Z79899 Other long term (current) drug therapy: Secondary | ICD-10-CM | POA: Diagnosis not present

## 2022-01-03 DIAGNOSIS — M5030 Other cervical disc degeneration, unspecified cervical region: Secondary | ICD-10-CM

## 2022-01-03 DIAGNOSIS — M62838 Other muscle spasm: Secondary | ICD-10-CM

## 2022-01-03 DIAGNOSIS — Z8639 Personal history of other endocrine, nutritional and metabolic disease: Secondary | ICD-10-CM

## 2022-01-03 DIAGNOSIS — Z8709 Personal history of other diseases of the respiratory system: Secondary | ICD-10-CM

## 2022-01-03 MED ORDER — METHOTREXATE 2.5 MG PO TABS: ORAL_TABLET | ORAL | 0 refills | Status: DC

## 2022-01-03 MED ORDER — TRIAMCINOLONE ACETONIDE 40 MG/ML IJ SUSP
10.0000 mg | INTRAMUSCULAR | Status: AC | PRN
  Administered 2022-01-03: 10 mg via INTRAMUSCULAR

## 2022-01-03 MED ORDER — ENBREL SURECLICK 50 MG/ML ~~LOC~~ SOAJ: SUBCUTANEOUS | 0 refills | Status: DC

## 2022-01-03 MED ORDER — HYDROXYCHLOROQUINE SULFATE 200 MG PO TABS: ORAL_TABLET | ORAL | 0 refills | Status: DC

## 2022-01-03 MED ORDER — LIDOCAINE HCL 1 % IJ SOLN
0.5000 mL | INTRAMUSCULAR | Status: AC | PRN
  Administered 2022-01-03: .5 mL

## 2022-01-03 MED ORDER — CLOBETASOL PROPIONATE 0.05 % EX CREA: 1.0000 | TOPICAL_CREAM | Freq: Two times a day (BID) | CUTANEOUS | 0 refills | Status: AC

## 2022-01-03 NOTE — Telephone Encounter (Signed)
Please review requested refills that are pended and send to the pharmacies. Thanks!

## 2022-01-03 NOTE — Progress Notes (Signed)
Office Visit Note  Patient: Michelle [name]             Date of Birth: [birth year]           MRN: [id number]             PCP: Lester Century., MD Referring: Lester [place]., MD Visit Date: 01/03/2022 Occupation: @GUAROCC @  Subjective:  Trapezius muscle tension tenderness bilaterally.  History of Present Illness: Michelle [name] is a 46 y.o. female with history of psoriatic arthritis, DDD, and osteoarthritis.  She remains on plaquenil 200 mg 1 tablet in the morning and 1/2 tablet in the evening, Enbrel 50 mg sq injections once weekly and methotrexate 6 tablets by mouth once weekly.  She has had several gaps in therapy due to infections but is back on all 3 medications consistently.  She continues to tolerate triple therapy without any side effects.  She states that with the weather starting to change she is noticing increased pain and stiffness in both ankle joints as well as some Achilles tendinitis bilaterally.  She denies any plantar fasciitis.  She denies any SI joint discomfort.  She experiences intermittent pain in both hands, both wrists, both elbow joints.  She has occasional discomfort in her right knee joint.  She uses Voltaren gel topically as needed for pain relief and alternates ice and heat.  She states she is having a flare of psoriasis on her lower legs and requested a prescription for clobetasol cream which she applies topically as needed. She is currently experiencing increased trapezius muscle tension tenderness bilaterally.  She has noticed discomfort with range of motion of her neck and requested trapezius trigger point injections today.   Activities of Daily Living:  Patient reports morning stiffness for 10-60 minutes.   Patient Reports nocturnal pain.  Difficulty dressing/grooming: Reports Difficulty climbing stairs: Reports Difficulty getting out of chair: Reports Difficulty using hands for taps, buttons, cutlery, and/or writing: Reports  Review of Systems   Constitutional:  Positive for fatigue.  HENT:  Positive for mouth dryness. Negative for mouth sores.   Eyes:  Positive for dryness.  Respiratory:  Positive for shortness of breath.   Cardiovascular:  Negative for chest pain and palpitations.  Gastrointestinal:  Positive for constipation. Negative for blood in stool and diarrhea.  Endocrine: Negative for increased urination.  Genitourinary:  Negative for involuntary urination.  Musculoskeletal:  Positive for joint pain, joint pain, joint swelling, myalgias, muscle weakness, morning stiffness, muscle tenderness and myalgias. Negative for gait problem.  Skin:  Positive for color change, rash, hair loss and sensitivity to sunlight.  Allergic/Immunologic: Positive for susceptible to infections.  Neurological:  Positive for dizziness and headaches.  Hematological:  Negative for swollen glands.  Psychiatric/Behavioral:  Positive for sleep disturbance. Negative for depressed mood. The patient is not nervous/anxious.     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   Psoriatic arthropathy (HCC) 04/17/2016   Psoriasis 04/17/2016   ANA positive 04/17/2016   Other fatigue 04/17/2016   DDD (degenerative disc disease), cervical 04/17/2016   DDD (degenerative disc disease), lumbar 04/17/2016   History of diabetes mellitus 04/17/2016   History of migraine 04/17/2016  Fatty liver 04/17/2016   Diabetes (HCC)    Depression    Fibromyalgia    Muscle cramps     Past Medical History:  Diagnosis Date   Depression    Diabetes (HCC)    Fibromyalgia    Migraine    Muscle cramps    Psoriasis     History reviewed. No pertinent family  history. Past Surgical History:  Procedure Laterality Date   ABDOMINAL HYSTERECTOMY     CESAREAN SECTION     CHOLECYSTECTOMY     FACET JOINT INJECTION     GASTRIC BYPASS     radiofrequency injections     neck, pain management did injections   SI Joint Injection     tummy tuck  03/24/2018   Social History   Social History Narrative   Patient lives at home alone with her husband and she works full time disability .    Right handed.   Caffeine one times per day.   Immunization History  Administered Date(s) Administered   Influenza Inj Mdck Quad Pf 02/13/2017   Influenza,inj,Quad PF,6+ Mos 01/23/2016, 01/27/2018   Influenza-Unspecified 01/23/2016   Moderna Sars-Covid-2 Vaccination 04/17/2019, 05/21/2019, 07/24/2020   Pneumococcal Polysaccharide-23 01/20/2012   Tdap 07/28/2012     Objective: Vital Signs: BP 119/84 (BP Location: Left Arm, Patient Position: Sitting, Cuff Size: Normal)   Pulse 77   Resp 16   Ht 5\' 4"  (1.626 m)   Wt 148 lb 3.2 oz (67.2 kg)   BMI 25.44 kg/m    Physical Exam Vitals and nursing note reviewed.  Constitutional:      Appearance: She is well-developed.  HENT:     Head: Normocephalic and atraumatic.  Eyes:     Conjunctiva/sclera: Conjunctivae normal.  Cardiovascular:     Rate and Rhythm: Normal rate and regular rhythm.     Heart sounds: Normal heart sounds.  Pulmonary:     Effort: Pulmonary effort is normal.     Breath sounds: Normal breath sounds.  Abdominal:     General: Bowel sounds are normal.     Palpations: Abdomen is soft.  Musculoskeletal:     Cervical back: Normal range of motion.  Skin:    General: Skin is warm and dry.     Capillary Refill: Capillary refill takes less than 2 seconds.  Neurological:     Mental Status: She is alert and oriented to person, place, and time.  Psychiatric:        Behavior: Behavior normal.      Musculoskeletal Exam: C-spine has painful ROM.  Trapezius muscle tension and tenderness bilaterally.   Shoulder joints, elbow joints, wrist joints, MCPs, PIPs, DIPs have good range of motion with no synovitis.  Tenderness over the right first MCP joint noted.  Complete fist formation bilaterally.  Hip joints have good range of motion with no groin pain.  Knee joints have good range of motion with no warmth or effusion. Tenderness over both ankle joints and along the achilles tendons bilaterally.  No evidence of plantar fasciitis.  No tenderness or synovitis of MTP joints.  No dactylitis noted.   CDAI Exam: CDAI Score: -- Patient Global: --; Provider Global: -- Swollen: --; Tender: -- Joint Exam 01/03/2022   No joint exam has been documented for this visit   There is currently no information documented on the homunculus. Go to the Rheumatology activity and complete the homunculus joint exam.  Investigation: No additional findings.  Imaging: No results found.  Recent Labs: Lab Results  Component Value Date   WBC 5.5 07/25/2021   HGB 12.1 07/25/2021   PLT 234 07/25/2021   NA 133 (L) 07/25/2021   K 4.9 07/25/2021   CL 98 07/25/2021   CO2 29 07/25/2021   GLUCOSE 117 (H) 07/25/2021   BUN 6 (L) 07/25/2021   CREATININE 0.66 07/25/2021   BILITOT 0.3 07/25/2021   ALKPHOS 113 10/29/2016   AST 25 07/25/2021   ALT 16 07/25/2021   PROT 6.9 07/25/2021   ALBUMIN 4.3 10/29/2016   CALCIUM 9.2 07/25/2021   GFRAA 128 01/10/2019   QFTBGOLDPLUS NEGATIVE 07/25/2021    Speciality Comments: PLQ Eye Exam:  10/19/2020 WNL at [facility]. Follow up 1 year.   Procedures:  Trigger Point Inj  Date/Time: 01/03/2022 8:41 AM  Performed by: Gearldine Bienenstock, PA-C Authorized by: Gearldine Bienenstock, PA-C   Consent Given by:  Patient Site marked: the procedure site was marked   Timeout: prior to procedure the correct patient, procedure, and site was verified   Indications:  Pain Total # of Trigger Points:  2 Location: neck   Needle Size:  27 G Approach:  Dorsal Medications #1:  0.5 mL lidocaine  1 %; 10 mg triamcinolone acetonide 40 MG/ML Medications #2:  0.5 mL lidocaine 1 %; 10 mg triamcinolone acetonide 40 MG/ML Patient tolerance:  Patient tolerated the procedure well with no immediate complications  Allergies: Zoledronic acid, Duloxetine, Flunisolide, Vilazodone, Latex, and Tape   Assessment / Plan:     Visit Diagnoses: Psoriatic arthropathy (HCC): She has no synovitis or dactylitis on examination today.  She presents today with increased pain and stiffness in both ankle joints but no synovitis was noted.  She has some tenderness along the Achilles tendon bilaterally but no inflammation or evidence of planter fasciitis noted.  She is not experiencing any increase SI joint discomfort at this time.  She continues to experience occasional arthralgias and joint stiffness especially in her hands, wrists, and right knee.  She uses Voltaren gel topically as needed for pain relief.  Overall her psoriatic arthritis remains well controlled on Enbrel 50 mg subcu injections once weekly, methotrexate 6 tablets once weekly, and Plaquenil 200 mg 1 tab in the morning and half tablet in the evening.  She is tolerating triple therapy without any side effects or injection site reactions from Enbrel.  She has had several short gaps in therapy while recovering from infections but has been on triple therapy consistently recently.  No medication changes will be made at this time.  She was advised to notify us if she develops increased joint pain or joint swelling.  She will follow-up in the office in 5 months or sooner if needed.  Psoriasis: She has a few patches of scattered psoriasis on her lower legs.  She requested a refill of clobetasol cream to be sent to the pharmacy which she applies topically as needed. No fingernail pitting noted.  High risk medication use: Enbrel 50 mg subcutaneous injections once weekly, methotrexate 6 tablets by mouth once weekly, folic acid 2 mg daily, Plaquenil 200 mg 1 tab in the  morning and half tablet in the evening. Tolerating triple therapy without any side effects or injection site reactions from Enbrel. BMP and CBC updated on 11/27/21. AST and ALT WNL 11/25/21.  Her next lab work will be due in November and every 3 months to monitor for drug toxicity.  Standing orders for CBC and CMP remain in place. TB gold negative on 07/25/21 and will  continue to be monitored yearly.  Plaquenil eye exam is scheduled in November 2023.  Discussed the importance of holding Enbrel and methotrexate if she develops signs or symptoms of an infection and to resume once the infection has completely cleared. Discussed the importance of yearly skin cancer screenings while on Enbrel.   DDD (degenerative disc disease), cervical: C-spine is limited range of motion without rotation.  She is experiencing increased trapezius muscle tension tenderness bilaterally.  Trigger point injections were performed today.  DDD (degenerative disc disease), lumbar: No symptoms of radiculopathy at this time.  Fibromyalgia: She continues to experience intermittent myalgias and muscle tenderness due to fibromyalgia.  Her discomfort is usually exacerbated by weather changes.  She requested bilateral trigger point cortisone injections today.  She tolerated the procedure well.  Procedure note was completed above.  Discussed the importance of regular exercise and good sleep hygiene.  Trapezius muscle spasm: She presents today with bilateral trapezius muscle tension and tenderness.  She is been experiencing muscle spasms intermittently.  She has slightly limited range of motion with lateral rotation due to the discomfort.  She requested trigger point injections today.  She tolerated the procedures well.  Procedure note was completed above.  Aftercare was discussed.  Other fatigue: Chronic, stable.  She has been trying to exercise for 15 minutes every single day.  Osteopenia of multiple sites: DEXA 11/30/20: BMD 0.617 with T  score -2.1. no comparison. She has been practicing yoga and Pilates.  She is taking calcium and vitamin D supplement daily.  Other medical conditions are listed as follows:  Closed nondisplaced fracture of proximal phalanx of right great toe with routine healing, subsequent encounter  History of diabetes mellitus  Interstitial cystitis  History of hypothyroidism  History of gastric bypass  History of asthma  History of depression  Orders: Orders Placed This Encounter  Procedures   Trigger Point Inj   No orders of the defined types were placed in this encounter.    Follow-Up Instructions: Return in about 5 months (around 06/05/2022) for Psoriatic arthritis, Fibromyalgia, DDD.   Gearldine Bienenstock, PA-C  Note - This record has been created using Dragon software.  Chart creation errors have been sought, but may not always  have been located. Such creation errors do not reflect on  the standard of medical care.

## 2022-01-03 NOTE — Patient Instructions (Addendum)
Standing Labs We placed an order today for your standing lab work.   Please have your standing labs drawn in November and every 3 months   If possible, please have your labs drawn 2 weeks prior to your appointment so that the provider can discuss your results at your appointment.  Please note that you may see your imaging and lab results in MyChart before we have reviewed them. We may be awaiting multiple results to interpret others before contacting you. Please allow our office up to 72 hours to thoroughly review all of the results before contacting the office for clarification of your results.  We currently have open lab daily: Monday through Thursday from 1:30 PM-4:30 PM and Friday from 1:30 PM- 4:00 PM If possible, please come for your lab work on Monday, Thursday or Friday afternoons, as you may experience shorter wait times.   Effective February 19, 2022 the new lab hours will change to: Monday through Thursday from 1:30 PM-5:00 PM and Friday from 8:30 AM-12:00 PM If possible, please come for your lab work on Monday and Thursday afternoons, as you may experience shorter wait times.  Please be advised, all patients with office appointments requiring lab work will take precedent over walk-in lab work.    The office is located at [address], [address], [place], [place] [address] No appointment is necessary.   Labs are drawn by [facility]. Please bring your co-pay at the time of your lab draw.  You may receive a bill from [facility] for your lab work.  Please note if you are on Hydroxychloroquine and and an order has been placed for a Hydroxychloroquine level, you will need to have it drawn 4 hours or more after your last dose.  If you wish to have your labs drawn at another location, please call the office 24 hours in advance to send orders.  If you have any questions regarding directions or hours of operation,  please call [phone].   As a reminder, please drink plenty of water prior  to coming for your lab work. Thanks! If you have signs or symptoms of an infection or start antibiotics: First, call your PCP for workup of your infection. Hold your medication through the infection, until you complete your antibiotics, and until symptoms resolve if you take the following: Injectable medication (Actemra, Benlysta, Cimzia, Cosentyx, Enbrel, Humira, Kevzara, Orencia, Remicade, Simponi, Stelara, Taltz, Tremfya) Methotrexate Leflunomide (Arava) Mycophenolate (Cellcept) Xeljanz, Olumiant, or Rinvoq  Vaccines You are taking a medication(s) that can suppress your immune system.  The following immunizations are recommended: Flu annually Covid-19  Td/Tdap (tetanus, diphtheria, pertussis) every 10 years Pneumonia (Prevnar 15 then Pneumovax 23 at least 1 year apart.  Alternatively, can take Prevnar 20 without needing additional dose) Shingrix: 2 doses from 4 weeks to 6 months apart  Please check with your PCP to make sure you are up to date.  

## 2022-03-24 ENCOUNTER — Other Ambulatory Visit: Payer: Self-pay | Admitting: Physician Assistant

## 2022-03-24 NOTE — Telephone Encounter (Signed)
Next Visit: 06/05/2021  Last Visit: 01/03/2022  Last Fill: 01/03/2022  UD:[id number] arthropathy   Current Dose per office note 01/03/2022: Enbrel 50 mg subcutaneous injections once weekly   Labs: 03/10/2022 RBC 3.87, 11/27/2021 BMP BUN 4, Calcium 8.4,   TB Gold: 07/25/2021  TB gold negative.   Okay to refill Enbrel?

## 2022-05-02 ENCOUNTER — Other Ambulatory Visit: Payer: Self-pay | Admitting: Physician Assistant

## 2022-05-02 ENCOUNTER — Telehealth: Payer: Self-pay | Admitting: Pharmacist

## 2022-05-02 NOTE — Telephone Encounter (Signed)
Next Visit: 06/05/2022  Last Visit: 01/03/2022  Labs: 03/10/2022 RBC 3.87  Eye exam: 04/09/2022 WNL   Current Dose per office note 01/03/2022: Plaquenil 200 mg 1 tab in the morning and half tablet in the evening.   DX: Psoriatic arthropathy   Last Fill: 01/03/2022  Okay to refill Plaquenil?

## 2022-05-02 NOTE — Telephone Encounter (Signed)
Submitted a Prior Authorization RENEWAL request to [employer] for ENBREL via CoverMyMeds. Will update once we receive a response.  Key: Cherre Blanc, PharmD, MPH, BCPS, CPP Clinical Pharmacist (Rheumatology and Pulmonology)

## 2022-05-02 NOTE — Telephone Encounter (Signed)
Received notification from [employer] regarding a prior authorization for ENBREL. Authorization has been APPROVED from 04/02/2022 to 05/02/2023.   Patient must continue to fill through [facility]: [phone]  Case # [id number]  Knox Saliva, PharmD, MPH, BCPS, CPP Clinical Pharmacist (Rheumatology and Pulmonology)

## 2022-05-23 NOTE — Progress Notes (Signed)
Office Visit Note  Patient: Michelle [name]             Date of Birth: [birth year]           MRN: [id number]             PCP: Houston Siren., MD Referring: Houston Siren., MD Visit Date: 06/05/2022 Occupation: @GUAROCC$ @  Subjective:  Neck pain  History of Present Illness: Michelle [name] is a 47 y.o. female history of psoriatic arthritis, psoriasis, osteoarthritis and degenerative disc disease.  She states she had a upper respiratory tract infection which led to pneumonia.  She was diagnosed with pneumonia 2 weeks ago.  She has been off methotrexate and Enbrel due to recent infections.  Not develop a flare of psoriatic arthritis.  She has a patch of psoriasis on her right ankle for which she has been using topical agents.  Denies any flares of uveitis, Planter fasciitis or Achilles tendinitis.  She has been having pain and discomfort in the trapezius region.  She denies any SI joint pain.  She has been experiencing increased flares of fibromyalgia with the weather change.    Activities of Daily Living:  Patient reports morning stiffness for 2-4 hours.   Patient Reports nocturnal pain.  Difficulty dressing/grooming: Reports Difficulty climbing stairs: Reports Difficulty getting out of chair: Reports Difficulty using hands for taps, buttons, cutlery, and/or writing: Reports  Review of Systems  Constitutional:  Positive for fatigue.  HENT:  Positive for mouth dryness. Negative for mouth sores.   Eyes:  Positive for dryness.  Respiratory:  Positive for shortness of breath.   Cardiovascular: Negative.  Negative for chest pain and palpitations.  Gastrointestinal: Negative.  Negative for blood in stool, constipation and diarrhea.  Endocrine: Negative.  Negative for increased urination.  Genitourinary:  Positive for decreased urine output. Negative for involuntary urination.  Musculoskeletal:  Positive for joint pain, joint pain, myalgias, morning stiffness, muscle tenderness and  myalgias. Negative for gait problem and joint swelling.  Skin:  Positive for rash, hair loss and sensitivity to sunlight. Negative for color change.  Allergic/Immunologic: Positive for susceptible to infections.  Neurological:  Positive for dizziness and headaches.  Hematological: Negative.  Negative for swollen glands.  Psychiatric/Behavioral:  Positive for sleep disturbance. Negative for depressed mood. The patient is nervous/anxious.     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   Psoriatic arthropathy ([place]) 04/17/2016   Psoriasis 04/17/2016   ANA positive 04/17/2016   Other fatigue 04/17/2016   DDD (degenerative disc disease), cervical 04/17/2016   DDD (degenerative disc disease), lumbar 04/17/2016   History of diabetes mellitus 04/17/2016   History of migraine 04/17/2016   Fatty liver 04/17/2016   Diabetes ([place])    Depression    Fibromyalgia    Muscle cramps     Past Medical History:  Diagnosis Date   Depression    Diabetes ([place])    Fibromyalgia    Migraine    Muscle cramps    Psoriasis     Family History  Problem Relation Age of Onset   Cancer Mother    Cancer Father    Diabetes Sister    Cancer Brother  Past Surgical History:  Procedure Laterality Date   ABDOMINAL HYSTERECTOMY     CESAREAN SECTION     CHOLECYSTECTOMY     FACET JOINT INJECTION     GASTRIC BYPASS     radiofrequency injections     neck, pain management did injections   SI Joint Injection     tummy tuck  03/24/2018   Social History   Social History Narrative   Patient lives at home alone with her husband and she works full time disability .    Right handed.   Caffeine  one times per day.   Immunization History  Administered Date(s) Administered   Influenza Inj Mdck Quad Pf 02/13/2017   Influenza,inj,Quad PF,6+ Mos 01/23/2016, 01/27/2018   Influenza-Unspecified 01/23/2016   Moderna Sars-Covid-2 Vaccination 04/17/2019, 05/21/2019, 07/24/2020   Pneumococcal Polysaccharide-23 01/20/2012   Tdap 07/28/2012     Objective: Vital Signs: BP 138/80 (BP Location: Left Arm, Patient Position: Sitting, Cuff Size: Normal)   Pulse 86   Resp 16   Ht 5' 4"$  (1.626 m)   Wt 147 lb 3.2 oz (66.8 kg)   BMI 25.27 kg/m    Physical Exam Vitals and nursing note reviewed.  Constitutional:      Appearance: She is well-developed.  HENT:     Head: Normocephalic and atraumatic.  Eyes:     Conjunctiva/sclera: Conjunctivae normal.  Cardiovascular:     Rate and Rhythm: Normal rate and regular rhythm.     Heart sounds: Normal heart sounds.  Pulmonary:     Effort: Pulmonary effort is normal.     Breath sounds: Normal breath sounds.  Abdominal:     General: Bowel sounds are normal.     Palpations: Abdomen is soft.  Musculoskeletal:     Cervical back: Normal range of motion.  Lymphadenopathy:     Cervical: No cervical adenopathy.  Skin:    General: Skin is warm and dry.     Capillary Refill: Capillary refill takes less than 2 seconds.     Comments: Psoriasis patches noted over right shin.  Neurological:     Mental Status: She is alert and oriented to person, place, and time.  Psychiatric:        Behavior: Behavior normal.      Musculoskeletal Exam: She had good range of motion of the cervical spine with some discomfort.  She had bilateral trapezius spasm.  She had good range of motion of the lumbar spine with discomfort.  Shoulders, elbows, wrist joints, MCPs PIPs and DIPs Juengel range of motion with no synovitis.  Hip joints, knee joints were in good range of motion without any warmth swelling or effusion.  There was no tenderness over ankles or MTPs. CDAI  Exam: CDAI Score: -- Patient Global: --; Provider Global: -- Swollen: --; Tender: -- Joint Exam 06/05/2022   No joint exam has been documented for this visit   There is currently no information documented on the homunculus. Go to the Rheumatology activity and complete the homunculus joint exam.  Investigation: No additional findings.  Imaging: No results found.  Recent Labs: Lab Results  Component Value Date   WBC 5.5 07/25/2021   HGB 12.1 07/25/2021   PLT 234 07/25/2021   NA 133 (L) 07/25/2021   K 4.9 07/25/2021   CL 98 07/25/2021   CO2 29 07/25/2021   GLUCOSE 117 (H) 07/25/2021   BUN 6 (L) 07/25/2021   CREATININE 0.66 07/25/2021   BILITOT 0.3 07/25/2021   ALKPHOS 113 10/29/2016  AST 25 07/25/2021   ALT 16 07/25/2021   PROT 6.9 07/25/2021   ALBUMIN 4.3 10/29/2016   CALCIUM 9.2 07/25/2021   GFRAA 128 01/10/2019   QFTBGOLDPLUS NEGATIVE 07/25/2021       Speciality Comments: PLQ Eye Exam:  04/09/2022 WNL at [facility]. Follow up 1 year.   Procedures:  Trigger Point Inj  Date/Time: 06/05/2022 4:09 PM  Performed by: Bo Merino, MD Authorized by: Bo Merino, MD   Consent Given by:  Patient Site marked: the procedure site was marked   Timeout: prior to procedure the correct patient, procedure, and site was verified   Indications:  Muscle spasm and pain Total # of Trigger Points:  2 Location: neck   Needle Size:  27 G Approach:  Dorsal Medications #1:  0.5 mL lidocaine 1 %; 10 mg triamcinolone acetonide 40 MG/ML Medications #2:  0.5 mL lidocaine 1 %; 10 mg triamcinolone acetonide 40 MG/ML Patient tolerance:  Patient tolerated the procedure well with no immediate complications  Allergies: Zoledronic acid, Duloxetine, Flunisolide, Vilazodone, Latex, and Tape   Assessment / Plan:     Visit Diagnoses: Psoriatic arthropathy (HCC)-patient denies having a flare of psoriatic arthritis.  She denies any history of dactylitis, Planter fasciitis,  Achilles tendinitis or uveitis.  She states she had to come off Enbrel methotrexate and Plaquenil for about 2 weeks due to recent infection pneumonia.  She states she was treated with prednisone and antibiotics.  She is gradually recovering.  She plans to restart medications next week if she stays well.  She denies having a flare of psoriatic arthritis.  She had no synovitis on examination today.  Psoriasis-she had a mild flare of psoriasis with a patch of psoriasis on her right lower extremity.  She has been using topical clobetasol cream which helps.  High risk medication use - Enbrel 50 mg subcu once weekly, methotrexate 6 tabs po once weekly, folic acid 2 mg daily, Plaquenil 200 mg 1 tab in the morning and half tablet in the evening. -Labs obtained on 03/10/22 WBC 6.2, hemoglobin 12.6, platelets 253,10/2021 CMP AST 17 ALT 15 creatinine 0.64.  Eye exam was normal on April 09, 2022.  She has not been getting labs on a regular basis.  Need for getting labs every 3 months was emphasized.  TB Gold was negative on July 25, 2021.  Will get labs today.  Plan: CBC with Differential/Platelet, COMPLETE METABOLIC PANEL WITH GFR, QuantiFERON-TB Gold Plus.  Information on immunization was placed in the AVS.  She was also advised to hold Enbrel and methotrexate if she develops an infection resume after the infection resolves.  Annual skin examination to screen for skin cancer was advised.  Use of sunscreen was advised.  DDD (degenerative disc disease), cervical-she continues to have pain and stiffness in her cervical region.  She gets good response to trapezius injections.  She requested a bilateral trigger point injection.  DDD (degenerative disc disease), lumbar-she continues to have some stiffness in her lower back.  She had good mobility.  Fibromyalgia-she continues to have some generalized pain and discomfort.  She had positive tender points and generalized hyperalgesia.  Trapezius muscle spasm-she had  bilateral trapezius spasm.  She requested trigger point injections.  After informed consent was obtained bilateral trapezius area was injected with lidocaine and Kenalog as described above.  Patient tolerated the procedure well.  Postprocedure instructions were given.  Other fatigue-she been experiencing fatigue due to recent infection.  Osteopenia of multiple sites - DEXA  11/30/20: BMD 0.617 with T score -2.1. no comparison.  Will repeat DEXA scan later this year.  Other medical problems are listed as follows:  Closed nondisplaced fracture of proximal phalanx of right great toe with routine healing, subsequent encounter  History of diabetes mellitus-she was advised to monitor blood glucose closely after cortisone injection.  Interstitial cystitis  History of asthma  History of gastric bypass  History of hypothyroidism  History of depression  Orders: Orders Placed This Encounter  Procedures   CBC with Differential/Platelet   COMPLETE METABOLIC PANEL WITH GFR   QuantiFERON-TB Gold Plus   No orders of the defined types were placed in this encounter.   Follow-Up Instructions: Return in about 5 months (around 11/03/2022) for Psoriatic arthritis.   Bo Merino, MD  Note - This record has been created using Editor, commissioning.  Chart creation errors have been sought, but may not always  have been located. Such creation errors do not reflect on  the standard of medical care.

## 2022-06-05 ENCOUNTER — Ambulatory Visit: Payer: BC Managed Care – PPO | Attending: Rheumatology | Admitting: Rheumatology

## 2022-06-05 ENCOUNTER — Encounter: Payer: Self-pay | Admitting: Rheumatology

## 2022-06-05 VITALS — BP 138/80 | HR 86 | Resp 16 | Ht 64.0 in | Wt 147.2 lb

## 2022-06-05 DIAGNOSIS — M5030 Other cervical disc degeneration, unspecified cervical region: Secondary | ICD-10-CM

## 2022-06-05 DIAGNOSIS — M62838 Other muscle spasm: Secondary | ICD-10-CM

## 2022-06-05 DIAGNOSIS — Z79899 Other long term (current) drug therapy: Secondary | ICD-10-CM | POA: Diagnosis not present

## 2022-06-05 DIAGNOSIS — R5383 Other fatigue: Secondary | ICD-10-CM

## 2022-06-05 DIAGNOSIS — L4050 Arthropathic psoriasis, unspecified: Secondary | ICD-10-CM

## 2022-06-05 DIAGNOSIS — M5136 Other intervertebral disc degeneration, lumbar region: Secondary | ICD-10-CM

## 2022-06-05 DIAGNOSIS — Z8639 Personal history of other endocrine, nutritional and metabolic disease: Secondary | ICD-10-CM

## 2022-06-05 DIAGNOSIS — M797 Fibromyalgia: Secondary | ICD-10-CM

## 2022-06-05 DIAGNOSIS — Z8659 Personal history of other mental and behavioral disorders: Secondary | ICD-10-CM

## 2022-06-05 DIAGNOSIS — S92414D Nondisplaced fracture of proximal phalanx of right great toe, subsequent encounter for fracture with routine healing: Secondary | ICD-10-CM

## 2022-06-05 DIAGNOSIS — L409 Psoriasis, unspecified: Secondary | ICD-10-CM

## 2022-06-05 DIAGNOSIS — Z9884 Bariatric surgery status: Secondary | ICD-10-CM

## 2022-06-05 DIAGNOSIS — M8589 Other specified disorders of bone density and structure, multiple sites: Secondary | ICD-10-CM

## 2022-06-05 DIAGNOSIS — N3010 Interstitial cystitis (chronic) without hematuria: Secondary | ICD-10-CM

## 2022-06-05 DIAGNOSIS — Z8709 Personal history of other diseases of the respiratory system: Secondary | ICD-10-CM

## 2022-06-05 MED ORDER — LIDOCAINE HCL 1 % IJ SOLN
0.5000 mL | INTRAMUSCULAR | Status: AC | PRN
  Administered 2022-06-05: .5 mL

## 2022-06-05 MED ORDER — TRIAMCINOLONE ACETONIDE 40 MG/ML IJ SUSP
10.0000 mg | INTRAMUSCULAR | Status: AC | PRN
  Administered 2022-06-05: 10 mg via INTRAMUSCULAR

## 2022-06-05 NOTE — Patient Instructions (Signed)
Standing Labs We placed an order today for your standing lab work.   Please have your standing labs drawn in May and every 3 months  Please have your labs drawn 2 weeks prior to your appointment so that the provider can discuss your lab results at your appointment.  Please note that you may see your imaging and lab results in [place] before we have reviewed them. We will contact you once all results are reviewed. Please allow our office up to 72 hours to thoroughly review all of the results before contacting the office for clarification of your results.  Lab hours are:   Monday through Thursday from 8:00 am -12:30 pm and 1:00 pm-5:00 pm and Friday from 8:00 am-12:00 pm.  Please be advised, all patients with office appointments requiring lab work will take precedent over walk-in lab work.   Labs are drawn by [facility]. Please bring your co-pay at the time of your lab draw.  You may receive a bill from [facility] for your lab work.  Please note if you are on Hydroxychloroquine and and an order has been placed for a Hydroxychloroquine level, you will need to have it drawn 4 hours or more after your last dose.  If you wish to have your labs drawn at another location, please call the office 24 hours in advance so we can fax the orders.  The office is located at [address], [place], [place], [place] [address] No appointment is necessary.    If you have any questions regarding directions or hours of operation,  please call [phone].   As a reminder, please drink plenty of water prior to coming for your lab work. Thanks!   Vaccines You are taking a medication(s) that can suppress your immune system.  The following immunizations are recommended: Flu annually Covid-19  RSV Td/Tdap (tetanus, diphtheria, pertussis) every 10 years Pneumonia (Prevnar 15 then Pneumovax 23 at least 1 year apart.  Alternatively, can take Prevnar 20 without needing additional dose) Shingrix: 2 doses from 4 weeks  to 6 months apart  Please check with your PCP to make sure you are up to date.   If you have signs or symptoms of an infection or start antibiotics: First, call your PCP for workup of your infection. Hold your medication through the infection, until you complete your antibiotics, and until symptoms resolve if you take the following: Injectable medication (Actemra, Benlysta, Cimzia, Cosentyx, Enbrel, Humira, Kevzara, Orencia, Remicade, Simponi, Stelara, Taltz, Tremfya) Methotrexate Leflunomide (Arava) Mycophenolate (Cellcept) Roma Kayser, or Rinvoq  Get an annual skin examination to screen for skin cancer while you are on Enbrel

## 2022-06-07 LAB — CBC WITH DIFFERENTIAL/PLATELET
Absolute Monocytes: 703 cells/uL (ref 200–950)
Basophils Absolute: 43 cells/uL (ref 0–200)
Basophils Relative: 0.6 %
Eosinophils Absolute: 405 cells/uL (ref 15–500)
Eosinophils Relative: 5.7 %
HCT: 36.3 % (ref 35.0–45.0)
Hemoglobin: 12.9 g/dL (ref 11.7–15.5)
Lymphs Abs: 2606 cells/uL (ref 850–3900)
MCH: 32.3 pg (ref 27.0–33.0)
MCHC: 35.5 g/dL (ref 32.0–36.0)
MCV: 91 fL (ref 80.0–100.0)
MPV: 10.7 fL (ref 7.5–12.5)
Monocytes Relative: 9.9 %
Neutro Abs: 3344 cells/uL (ref 1500–7800)
Neutrophils Relative %: 47.1 %
Platelets: 239 10*3/uL (ref 140–400)
RBC: 3.99 10*6/uL (ref 3.80–5.10)
RDW: 12 % (ref 11.0–15.0)
Total Lymphocyte: 36.7 %
WBC: 7.1 10*3/uL (ref 3.8–10.8)

## 2022-06-07 LAB — COMPLETE METABOLIC PANEL WITH GFR
AG Ratio: 1.6 (calc) (ref 1.0–2.5)
ALT: 21 U/L (ref 6–29)
AST: 23 U/L (ref 10–35)
Albumin: 4.3 g/dL (ref 3.6–5.1)
Alkaline phosphatase (APISO): 84 U/L (ref 31–125)
BUN: 7 mg/dL (ref 7–25)
CO2: 28 mmol/L (ref 20–32)
Calcium: 9 mg/dL (ref 8.6–10.2)
Chloride: 103 mmol/L (ref 98–110)
Creat: 0.56 mg/dL (ref 0.50–0.99)
Globulin: 2.7 g/dL (calc) (ref 1.9–3.7)
Glucose, Bld: 90 mg/dL (ref 65–99)
Potassium: 4.7 mmol/L (ref 3.5–5.3)
Sodium: 138 mmol/L (ref 135–146)
Total Bilirubin: 0.2 mg/dL (ref 0.2–1.2)
Total Protein: 7 g/dL (ref 6.1–8.1)
eGFR: 113 mL/min/{1.73_m2} (ref >=60)

## 2022-06-07 LAB — QUANTIFERON-TB GOLD PLUS
Mitogen-NIL: >10 IU/mL
NIL: 0.03 IU/mL
QuantiFERON-TB Gold Plus: NEGATIVE
TB1-NIL: 0.02 IU/mL
TB2-NIL: 0.02 IU/mL

## 2022-06-08 NOTE — Progress Notes (Signed)
CBC and CMP are normal.  TB Gold is negative.

## 2022-06-16 ENCOUNTER — Other Ambulatory Visit: Payer: Self-pay | Admitting: Physician Assistant

## 2022-06-16 NOTE — Telephone Encounter (Signed)
Next Visit: 11/06/2022  Last Visit: 06/05/2022  Last Fill: 03/25/2022  [id number] arthropathy   Current Dose per office note 06/05/2022: Enbrel 50 mg subcu once weekly   Labs: 06/05/2022 CBC and CMP are normal.   TB Gold: 06/05/2022 Neg    Okay to refill Enbrel?

## 2022-06-23 ENCOUNTER — Telehealth: Payer: Self-pay | Admitting: *Deleted

## 2022-06-23 NOTE — Telephone Encounter (Signed)
Please call to check on patient tomorrow.

## 2022-06-23 NOTE — Telephone Encounter (Signed)
FYI - Patient called 06/20/2022, patient tested positive for COVID, patient had called PCP who had not returned her call, patient wanted meds for COVID, patient advised she was having difficulty breathing, I advised we don't prescribe COVID meds,  patient advised to go to Urgent Care or ER, patient advised to hold MTX and Enbrel until symptoms resolve.

## 2022-06-24 NOTE — Telephone Encounter (Signed)
I called patient, patient is feeling better.

## 2022-06-24 NOTE — Telephone Encounter (Signed)
LMOM for patient

## 2022-07-01 ENCOUNTER — Other Ambulatory Visit: Payer: Self-pay | Admitting: Rheumatology

## 2022-07-01 ENCOUNTER — Other Ambulatory Visit: Payer: Self-pay | Admitting: Physician Assistant

## 2022-07-01 DIAGNOSIS — L4050 Arthropathic psoriasis, unspecified: Secondary | ICD-10-CM

## 2022-07-01 NOTE — Telephone Encounter (Signed)
Next Visit: 11/06/2022  Last Visit: 06/05/2022  Labs: 06/05/2022 CBC and CMP are normal   Eye exam: 04/09/2022 WNL    Current Dose per office note 06/05/2022: Plaquenil 200 mg 1 tab in the morning and half tablet in the evening   [id number] arthropathy   Last Fill: 01/03/2022  Okay to refill Plaquenil?

## 2022-07-25 ENCOUNTER — Other Ambulatory Visit: Payer: Self-pay | Admitting: Physician Assistant

## 2022-07-25 NOTE — Telephone Encounter (Signed)
Last Fill: 05/02/2022  Labs: 06/05/2022 CBC and CMP are normal.   Next Visit: 11/06/2022  Last Visit: 06/05/2022  DX:  Psoriatic arthropathy   Current Dose per office note 06/05/2022: methotrexate 6 tabs po once weekly   Okay to refill Methotrexate?

## 2022-08-04 ENCOUNTER — Other Ambulatory Visit: Payer: Self-pay | Admitting: Rheumatology

## 2022-08-04 NOTE — Telephone Encounter (Signed)
Last Fill: 06/11/2020  Next Visit: 11/06/2022  Last Visit: 06/05/2022  Dx: DDD (degenerative disc disease), lumbar  Current Dose per office note on 06/05/2022: not discussed  Okay to refill Voltaren Gel?

## 2022-09-03 ENCOUNTER — Other Ambulatory Visit: Payer: Self-pay | Admitting: Physician Assistant

## 2022-09-03 DIAGNOSIS — L4050 Arthropathic psoriasis, unspecified: Secondary | ICD-10-CM

## 2022-09-03 NOTE — Telephone Encounter (Signed)
Last Fill: 07/01/2022  Eye exam: 04/09/2022   Labs: 06/05/2022 CBC and CMP are normal.   Next Visit: 11/06/2022  Last Visit: 06/05/2022  [id number] arthropathy   Current Dose per office note on 06/05/2022: Plaquenil 200 mg 1 tab in the morning and half tablet in the evening   Okay to refill Plaquenil?

## 2022-09-22 ENCOUNTER — Other Ambulatory Visit: Payer: Self-pay | Admitting: Physician Assistant

## 2022-10-03 ENCOUNTER — Other Ambulatory Visit: Payer: Self-pay | Admitting: Physician Assistant

## 2022-10-03 DIAGNOSIS — L4050 Arthropathic psoriasis, unspecified: Secondary | ICD-10-CM

## 2022-10-03 MED ORDER — FOLIC ACID 1 MG PO TABS: 2.0000 mg | ORAL_TABLET | Freq: Every day | ORAL | 2 refills | Status: DC

## 2022-10-03 NOTE — Telephone Encounter (Signed)
Last Fill: 08/12/2021  Next Visit: 11/06/2022  Last Visit: 06/05/2022  Dx: Psoriatic arthropathy   Current Dose per office note on 06/05/2022: folic acid 2 mg daily   Okay to refill Folic Acid?

## 2022-10-15 ENCOUNTER — Other Ambulatory Visit: Payer: Self-pay | Admitting: Physician Assistant

## 2022-10-15 NOTE — Telephone Encounter (Signed)
Last Fill: 07/25/2022  Labs: 09/10/2022 Sodium 135, RBC 3.68, Hgb 12.2, Hct 34.6, Eosinophils # 0.70  Next Visit: 11/06/2022  Last Visit: 06/05/2022  DX: Psoriatic arthropathy   Current Dose per office note 06/05/2022: methotrexate 6 tabs po once weekly   Okay to refill Methotrexate?

## 2022-10-23 NOTE — Progress Notes (Deleted)
Office Visit Note  Patient: Michelle [name]             Date of Birth: 05-04-1975           MRN: [id number]             PCP: Lester Le[place]., MD Referring: Lester Tifton., MD Visit Date: 11/06/2022 Occupation: @GUAROCC @  Subjective:    History of Present Illness: Michelle [name] is a 47 y.o. female with history of psoriatic arthritis.  Patient remains on Enbrel 50 mg sq injection once weekly, methotrexate 6 tabs po once weekly, folic acid 2 mg daily, Plaquenil 200 mg 1 tab in the morning and half tablet in the evening.   CBC and CMP were drawn on 06/05/2022. Orders for CBC and CMP were released today.  Her next lab work will be due in October and every 3 months to monitor for drug toxicity.  Standing orders for CBC and CMP were placed today. TB Gold negative on 06/05/2022. Discussed the importance of holding Enbrel and methotrexate if she develops signs or symptoms of an infection and to resume once the infection has completely cleared. PLQ Eye Exam: 04/09/2022 WNL at [facility]. Follow up 1 year.   Activities of Daily Living:  Patient reports morning stiffness for *** {minute/hour:19697}.   Patient {ACTIONS;DENIES/REPORTS:21021675::"Denies"} nocturnal pain.  Difficulty dressing/grooming: {ACTIONS;DENIES/REPORTS:21021675::"Denies"} Difficulty climbing stairs: {ACTIONS;DENIES/REPORTS:21021675::"Denies"} Difficulty getting out of chair: {ACTIONS;DENIES/REPORTS:21021675::"Denies"} Difficulty using hands for taps, buttons, cutlery, and/or writing: {ACTIONS;DENIES/REPORTS:21021675::"Denies"}  No Rheumatology ROS completed.   PMFS History:  Patient Active Problem List   Diagnosis Date Noted   Interstitial cystitis 11/24/2016   High risk medication use 11/02/2016   History of depression 11/02/2016   History of asthma 11/02/2016   Prolonged QT interval 11/02/2016   History of gastric bypass 11/02/2016   History of hypothyroidism 11/02/2016   History of renal calcinosis  11/02/2016   Psoriatic arthropathy (HCC) 04/17/2016   Psoriasis 04/17/2016   ANA positive 04/17/2016   Other fatigue 04/17/2016   DDD (degenerative disc disease), cervical 04/17/2016   DDD (degenerative disc disease), lumbar 04/17/2016   History of diabetes mellitus 04/17/2016   History of migraine 04/17/2016   Fatty liver 04/17/2016   Diabetes (HCC)    Depression    Fibromyalgia    Muscle cramps     Past Medical History:  Diagnosis Date   Depression    Diabetes (HCC)    Fibromyalgia    Migraine    Muscle cramps    Psoriasis     Family History  Problem Relation Age of Onset   Cancer Mother    Cancer Father    Diabetes Sister    Cancer Brother    Past Surgical History:  Procedure Laterality Date   ABDOMINAL HYSTERECTOMY     CESAREAN SECTION     CHOLECYSTECTOMY     FACET JOINT INJECTION     GASTRIC BYPASS     radiofrequency injections     neck, pain management did injections   SI Joint Injection     tummy tuck  03/24/2018   Social History   Social History Narrative   Patient lives at home alone with her husband and she works full time disability .    Right handed.   Caffeine one times per day.   Immunization History  Administered Date(s) Administered   Influenza Inj Mdck Quad Pf 02/13/2017   Influenza,inj,Quad PF,6+ Mos 01/23/2016, 01/27/2018   Influenza-Unspecified 01/23/2016   Moderna Sars-Covid-2 Vaccination 04/17/2019, 05/21/2019, 07/24/2020  Pneumococcal Polysaccharide-23 01/20/2012   Tdap 07/28/2012     Objective: Vital Signs: There were no vitals taken for this visit.   Physical Exam Vitals and nursing note reviewed.  Constitutional:      Appearance: She is well-developed.  HENT:     Head: Normocephalic and atraumatic.  Eyes:     Conjunctiva/sclera: Conjunctivae normal.  Cardiovascular:     Rate and Rhythm: Normal rate and regular rhythm.     Heart sounds: Normal heart sounds.  Pulmonary:     Effort: Pulmonary effort is normal.      Breath sounds: Normal breath sounds.  Abdominal:     General: Bowel sounds are normal.     Palpations: Abdomen is soft.  Musculoskeletal:     Cervical back: Normal range of motion.  Lymphadenopathy:     Cervical: No cervical adenopathy.  Skin:    General: Skin is warm and dry.     Capillary Refill: Capillary refill takes less than 2 seconds.  Neurological:     Mental Status: She is alert and oriented to person, place, and time.  Psychiatric:        Behavior: Behavior normal.      Musculoskeletal Exam: ***  CDAI Exam: CDAI Score: -- Patient Global: --; Provider Global: -- Swollen: --; Tender: -- Joint Exam 11/06/2022   No joint exam has been documented for this visit   There is currently no information documented on the homunculus. Go to the Rheumatology activity and complete the homunculus joint exam.  Investigation: No additional findings.  Imaging: No results found.  Recent Labs: Lab Results  Component Value Date   WBC 7.1 06/05/2022   HGB 12.9 06/05/2022   PLT 239 06/05/2022   NA 138 06/05/2022   K 4.7 06/05/2022   CL 103 06/05/2022   CO2 28 06/05/2022   GLUCOSE 90 06/05/2022   BUN 7 06/05/2022   CREATININE 0.56 06/05/2022   BILITOT 0.2 06/05/2022   ALKPHOS 113 10/29/2016   AST 23 06/05/2022   ALT 21 06/05/2022   PROT 7.0 06/05/2022   ALBUMIN 4.3 10/29/2016   CALCIUM 9.0 06/05/2022   GFRAA 128 01/10/2019   QFTBGOLDPLUS NEGATIVE 06/05/2022    Speciality Comments: PLQ Eye Exam:  04/09/2022 WNL at [facility]. Follow up 1 year.   Procedures:  No procedures performed Allergies: Zoledronic acid, Duloxetine, Flunisolide, Vilazodone, Latex, and Tape   Assessment / Plan:     Visit Diagnoses: Psoriatic arthropathy (HCC)  Psoriasis  High risk medication use  DDD (degenerative disc disease), cervical  DDD (degenerative disc disease), lumbar  Fibromyalgia  Trapezius muscle spasm  Other fatigue  Osteopenia of multiple sites  Closed  nondisplaced fracture of proximal phalanx of right great toe with routine healing, subsequent encounter  History of diabetes mellitus  Interstitial cystitis  History of asthma  History of gastric bypass  History of hypothyroidism  History of depression  Orders: No orders of the defined types were placed in this encounter.  No orders of the defined types were placed in this encounter.   Follow-Up Instructions: No follow-ups on file.   Gearldine Bienenstock, PA-C  Note - This record has been created using Dragon software.  Chart creation errors have been sought, but may not always  have been located. Such creation errors do not reflect on  the standard of medical care.

## 2022-11-06 ENCOUNTER — Ambulatory Visit: Payer: BC Managed Care – PPO | Admitting: Physician Assistant

## 2022-11-06 DIAGNOSIS — M5136 Other intervertebral disc degeneration, lumbar region: Secondary | ICD-10-CM

## 2022-11-06 DIAGNOSIS — Z8639 Personal history of other endocrine, nutritional and metabolic disease: Secondary | ICD-10-CM

## 2022-11-06 DIAGNOSIS — R5383 Other fatigue: Secondary | ICD-10-CM

## 2022-11-06 DIAGNOSIS — Z9884 Bariatric surgery status: Secondary | ICD-10-CM

## 2022-11-06 DIAGNOSIS — M5030 Other cervical disc degeneration, unspecified cervical region: Secondary | ICD-10-CM

## 2022-11-06 DIAGNOSIS — S92414D Nondisplaced fracture of proximal phalanx of right great toe, subsequent encounter for fracture with routine healing: Secondary | ICD-10-CM

## 2022-11-06 DIAGNOSIS — Z8659 Personal history of other mental and behavioral disorders: Secondary | ICD-10-CM

## 2022-11-06 DIAGNOSIS — M62838 Other muscle spasm: Secondary | ICD-10-CM

## 2022-11-06 DIAGNOSIS — N3010 Interstitial cystitis (chronic) without hematuria: Secondary | ICD-10-CM

## 2022-11-06 DIAGNOSIS — L4050 Arthropathic psoriasis, unspecified: Secondary | ICD-10-CM

## 2022-11-06 DIAGNOSIS — L409 Psoriasis, unspecified: Secondary | ICD-10-CM

## 2022-11-06 DIAGNOSIS — Z79899 Other long term (current) drug therapy: Secondary | ICD-10-CM

## 2022-11-06 DIAGNOSIS — M8589 Other specified disorders of bone density and structure, multiple sites: Secondary | ICD-10-CM

## 2022-11-06 DIAGNOSIS — Z8709 Personal history of other diseases of the respiratory system: Secondary | ICD-10-CM

## 2022-11-06 DIAGNOSIS — M797 Fibromyalgia: Secondary | ICD-10-CM

## 2022-11-10 NOTE — Progress Notes (Unsigned)
Office Visit Note  Patient: Michelle [name]             Date of Birth: 12-Sep-1975           MRN: [id number]             PCP: Lester [place]., MD Referring: Lester [place]., MD Visit Date: 11/19/2022 Occupation: @GUAROCC @  Subjective:  Pain in multiple joints   History of Present Illness: Michelle [name] is a 47 y.o. female with history of psoriatic arthritis, DDD, and fibromyalgia.  She remains on Enbrel 50 mg sq once weekly, methotrexate 6 tabs po once weekly, folic acid 2 mg daily, Plaquenil 200 mg 1 tab in the morning and half tablet in the evening.  She is tolerating combination therapy without any side effects.  Patient states that she has been experiencing more frequent and severe flares.  She states she is been flaring 1-2 times per month for the past 3 months.  She states that she is having swelling in the left ankle which started 3 to 4 days ago and soreness which has been going on for 1 week.  She is currently having discomfort in the left ankle due to Achilles tendinitis.  She has also been having more frequent flares of psoriasis.  She denies any gaps in therapy or any identifiable triggers for the increase in symptoms.  She states she is also been having increased myofascial pain due to fibromyalgia.  She denies any recent or recurrent infections.   Activities of Daily Living:  Patient reports morning stiffness for all day. Patient Reports nocturnal pain.  Difficulty dressing/grooming: Reports Difficulty climbing stairs: Reports Difficulty getting out of chair: Reports Difficulty using hands for taps, buttons, cutlery, and/or writing: Reports  Review of Systems  Constitutional:  Positive for fatigue.  HENT:  Positive for mouth dryness. Negative for mouth sores.   Eyes:  Positive for pain, itching and dryness.  Respiratory:  Positive for shortness of breath and wheezing.   Cardiovascular:  Negative for chest pain and palpitations.  Gastrointestinal:  Positive for  constipation and diarrhea. Negative for blood in stool.  Endocrine: Positive for heat intolerance and increased urination.  Genitourinary:  Positive for involuntary urination.  Musculoskeletal:  Positive for joint pain, gait problem, joint pain, joint swelling, myalgias, muscle weakness, morning stiffness, muscle tenderness and myalgias.  Skin:  Positive for color change, hair loss and sensitivity to sunlight. Negative for rash.  Allergic/Immunologic: Positive for susceptible to infections.  Neurological:  Positive for dizziness, numbness and headaches.  Hematological:  Negative for swollen glands.  Psychiatric/Behavioral:  Positive for sleep disturbance. Negative for depressed mood. The patient is not nervous/anxious.     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   Psoriatic arthropathy (HCC) 04/17/2016   Psoriasis 04/17/2016   ANA positive 04/17/2016   Other fatigue 04/17/2016   DDD (degenerative disc disease), cervical 04/17/2016   DDD (degenerative disc disease), lumbar 04/17/2016   History of diabetes mellitus 04/17/2016   History of migraine 04/17/2016   Fatty liver 04/17/2016   Diabetes (HCC)    Depression    Fibromyalgia    Muscle cramps     Past Medical  History:  Diagnosis Date   Depression    Diabetes (HCC)    Fibromyalgia    Migraine    Muscle cramps    Psoriasis     Family History  Problem Relation Age of Onset   Cancer Mother    Cancer Father    Diabetes Sister    Cancer Brother    Past Surgical History:  Procedure Laterality Date   ABDOMINAL HYSTERECTOMY     CESAREAN SECTION     CHOLECYSTECTOMY     FACET JOINT INJECTION     GASTRIC  BYPASS     radiofrequency injections     neck, pain management did injections   SI Joint Injection     tummy tuck  03/24/2018   Social History   Social History Narrative   Patient lives at home alone with her husband and she works full time disability .    Right handed.   Caffeine one times per day.   Immunization History  Administered Date(s) Administered   Influenza Inj Mdck Quad Pf 02/13/2017   Influenza,inj,Quad PF,6+ Mos 01/23/2016, 01/27/2018   Influenza-Unspecified 01/23/2016   Moderna Sars-Covid-2 Vaccination 04/17/2019, 05/21/2019, 07/24/2020   Pneumococcal Polysaccharide-23 01/20/2012   Tdap 07/28/2012     Objective: Vital Signs: BP 127/85 (BP Location: Left Arm, Patient Position: Sitting, Cuff Size: Normal)   Pulse 91   Resp 15   Ht 5\' 4"  (1.626 m)   Wt 145 lb 12.8 oz (66.1 kg)   BMI 25.03 kg/m    Physical Exam Vitals and nursing note reviewed.  Constitutional:      Appearance: She is well-developed.  HENT:     Head: Normocephalic and atraumatic.  Eyes:     Conjunctiva/sclera: Conjunctivae normal.  Cardiovascular:     Rate and Rhythm: Normal rate and regular rhythm.     Heart sounds: Normal heart sounds.  Pulmonary:     Effort: Pulmonary effort is normal.     Breath sounds: Normal breath sounds.  Abdominal:     General: Bowel sounds are normal.     Palpations: Abdomen is soft.  Musculoskeletal:     Cervical back: Normal range of motion.  Lymphadenopathy:     Cervical: No cervical adenopathy.  Skin:    General: Skin is warm and dry.     Capillary Refill: Capillary refill takes less than 2 seconds.  Neurological:     Mental Status: She is alert and oriented to person, place, and time.  Psychiatric:        Behavior: Behavior normal.      Musculoskeletal Exam: Generalized hyperalgesia and positive tender points.  Limited range of motion of the C-spine especially with flexion and extension.  Trapezius muscle tension tenderness bilaterally.   Tenderness over both SI joints.  Shoulder joints have good range of motion.  Tenderness over the lateral epicondyle of both elbows.  Tenderness over both wrist joints.  Tenderness over PIP and DIP joints.  Hip joints have good range of motion.  Knee joints have good range of motion with no warmth or effusion.  Tenderness and synovitis of the left ankle joint.  Tenderness along the Achilles tendon of the left foot.  No evidence of plantar fasciitis.  CDAI Exam: CDAI Score: -- Patient Global: --; Provider Global: -- Swollen: 1 ; Tender: 1  Joint Exam 11/19/2022      Right  Left  Ankle     Swollen Tender     Investigation: No additional findings.  Imaging: No results found.  Recent Labs: Lab Results  Component Value Date   WBC 7.1 06/05/2022   HGB 12.9 06/05/2022   PLT 239 06/05/2022   NA 138 06/05/2022   K 4.7 06/05/2022   CL 103 06/05/2022   CO2 28 06/05/2022   GLUCOSE 90 06/05/2022   BUN 7 06/05/2022   CREATININE 0.56 06/05/2022   BILITOT 0.2 06/05/2022   ALKPHOS 113 10/29/2016   AST 23 06/05/2022   ALT 21 06/05/2022   PROT 7.0 06/05/2022   ALBUMIN 4.3 10/29/2016   CALCIUM 9.0 06/05/2022   GFRAA 128 01/10/2019   QFTBGOLDPLUS NEGATIVE 06/05/2022    Speciality Comments: PLQ Eye Exam:  04/09/2022 WNL at [facility]. Follow up 1 year.   Procedures:  No procedures performed Allergies: Zoledronic acid, Duloxetine, Flunisolide, Vilazodone, Latex, and Tape   Assessment / Plan:     Visit Diagnoses: Psoriatic arthropathy (HCC) - Patient presents today with tenderness and synovitis in the left ankle joint.  She is also having discomfort along the Achilles tendon of the left foot.  She has been experiencing 1-2 flares per month for the past 3 months with no identifiable trigger.  She remains on Enbrel 50 mg subcutaneous injections once every week and methotrexate 6 tablets by mouth once weekly.  She has not had any gaps in therapy recently.  She has been experiencing  more flares of psoriasis along her hairline and lower legs.  Her fatigue has also been profound over the past 3 months.  Plan to check sed rate and CRP today.  Different treatment options were discussed today in detail.  Patient would like to discuss switching to another biologic since Enbrel has not been as effective as it previously was.  Previous therapy also includes Humira.  Reviewed indications, contraindications, and potential side effects of Taltz today in detail.  All questions were addressed and consent was obtained.  Plan on applying for Taltz through her insurance and once approved she will return to the office for administration of the first injection.  She will remain on methotrexate and Plaquenil as prescribed.  She will follow-up in the office in 6 to 8 weeks to assess her response.  The following baseline immunosuppressive labs will be obtained today prior to switching to Taltz.  Plan: Sedimentation rate, C-reactive protein  Psoriasis: Patient has been experiencing more frequent flares of psoriasis.  The flares of psoriasis have been scattered but primarily affecting her hairline and lower legs.  She has not had any gaps in therapy while on Enbrel and methotrexate recently.  Plan on switching Enbrel to Taltz as discussed above.   Medication counseling: Baseline Immunosuppressant Therapy Labs TB GOLD    Latest Ref Rng & Units 06/05/2022    3:53 PM  Quantiferon TB Gold  Quantiferon TB Gold Plus NEGATIVE NEGATIVE       Latest Ref Rng & Units 06/05/2022    3:53 PM  Serum Protein Electrophoresis  Total Protein 6.1 - 8.1 g/dL 7.0     Chest Xray:no active cardiopulmonary disease on 10/01/17   Does patient have a history of inflammatory bowel disease? No  Counseled patient that Altamease Oiler is a IL-17 inhibitor that works to reduce pain and inflammation associated with arthritis.  Counseled patient on purpose, proper use, and adverse effects of Taltz. Reviewed the most common adverse effects  of infection, inflammatory bowel disease, and allergic reaction. Counseled patient that Altamease Oiler should be held for infection and prior to scheduled surgery.  Counseled patient to avoid live vaccines while  on Taltz.  Advised patient to get annual influenza vaccine, pneumococcal vaccine, and Shingrix as indicated.  Reviewed storage information for Taltz.  Reviewed the importance of regular labs while on Taltz. Standing orders placed and is to return in 1 month and then every 3 months after initiation.  Provided patient with medication education material and answered all questions.  Patient consented to Taltz.  Will upload consent into patient's chart.  Will apply for Taltz through patient's insurance and update when we receive a response.  Advised initial injection must be administered in office.  Patient voiced understanding.    Taltz dose will be: For psoriatic arthritis and plaque psoriasis overlap load of 160 mg then 80 mg on weeks 2,4,6,8,10,12 then 80 mg every 28 days  Prescription will be sent to pharmacy pending lab results and insurance approval.  High risk medication use -Applying for Taltz 80 mg subcutaneous injections every month.  She will remain on methotrexate 6 tabs po once weekly, folic acid 2 mg daily, Plaquenil 200 mg 1 tab in the morning and half tablet in the evening.  CBC and CMP updated on 09/10/22.  Orders for CBC and CMP were released today.  She will require updated lab work in 1 month and every 3 months after initiating Taltz.  Standing orders for CBC and CMP will be placed today. TB gold negative on 06/05/22.  Previous medications: Humira. Inadequate response to Enbrel.  PLQ Eye Exam: 04/09/2022 WNL at [facility]. Follow up 1 year.  Discussed the importance of holding enbrel and methotrexate if she develops signs or symptoms of an infection and to resume once the infection has completely cleared. - Plan: CBC with Differential/Platelet, COMPLETE METABOLIC PANEL WITH GFR,  QuantiFERON-TB Gold Plus, Serum protein electrophoresis with reflex, IgG, IgA, IgM, Hepatitis B core antibody, IgM, Hepatitis B surface antigen, Hepatitis C antibody, COMPLETE METABOLIC PANEL WITH GFR, CBC with Differential/Platelet  DDD (degenerative disc disease), cervical: Chronic pain.  Remains under the care of pain management.  Patient has had injections in the past.  DDD (degenerative disc disease), lumbar: Chronic pain.  Fibromyalgia: Generalized hyperalgesia and positive tender points on exam.  Patient has been experiencing more frequent and severe fibromyalgia flares.  She has had significant fatigue as well as increased pain for the past 3 months.  She remains under the care of pain management but does not feel that her pain level and inflammation have been as well-controlled.  She has tried to remain active exercising but has had increased difficulty due to fatigue and pain. Patient is unable to tolerate an increased dose of Cymbalta.  She remains on Cymbalta 20 mg 1 capsule every other day.  Trapezius muscle spasm: Patient had trigger point injections on 06/05/2022.  Other fatigue: Patient presents today with increased fatigue.  He has been experiencing fatigue on a daily basis despite trying to get good sleep at night.  Osteopenia of multiple sites - DEXA 11/30/20: BMD 0.617 with T score -2.1. no comparison.  Due to update DEXA in August 2024.  A future order for a DEXA will be placed today.  Closed nondisplaced fracture of proximal phalanx of right great toe with routine healing, subsequent encounter  Other medical conditions are listed as follows:  Interstitial cystitis  History of diabetes mellitus  History of gastric bypass  History of asthma  History of hypothyroidism  History of depression  Orders: Orders Placed This Encounter  Procedures   DG BONE DENSITY (DXA)   CBC with Differential/Platelet  COMPLETE METABOLIC PANEL WITH GFR   Sedimentation rate    C-reactive protein   QuantiFERON-TB Gold Plus   Serum protein electrophoresis with reflex   IgG, IgA, IgM   Hepatitis B core antibody, IgM   Hepatitis B surface antigen   Hepatitis C antibody   COMPLETE METABOLIC PANEL WITH GFR   CBC with Differential/Platelet   No orders of the defined types were placed in this encounter.    Follow-Up Instructions: Return in about 8 weeks (around 01/14/2023).   Gearldine Bienenstock, PA-C  Note - This record has been created using Dragon software.  Chart creation errors have been sought, but may not always  have been located. Such creation errors do not reflect on  the standard of medical care.

## 2022-11-19 ENCOUNTER — Encounter: Payer: Self-pay | Admitting: Physician Assistant

## 2022-11-19 ENCOUNTER — Ambulatory Visit: Payer: BC Managed Care – PPO | Attending: Physician Assistant | Admitting: Physician Assistant

## 2022-11-19 VITALS — BP 127/85 | HR 91 | Resp 15 | Ht 64.0 in | Wt 145.8 lb

## 2022-11-19 DIAGNOSIS — M797 Fibromyalgia: Secondary | ICD-10-CM

## 2022-11-19 DIAGNOSIS — M51369 Other intervertebral disc degeneration, lumbar region without mention of lumbar back pain or lower extremity pain: Secondary | ICD-10-CM

## 2022-11-19 DIAGNOSIS — M5136 Other intervertebral disc degeneration, lumbar region: Secondary | ICD-10-CM

## 2022-11-19 DIAGNOSIS — N3010 Interstitial cystitis (chronic) without hematuria: Secondary | ICD-10-CM

## 2022-11-19 DIAGNOSIS — Z79899 Other long term (current) drug therapy: Secondary | ICD-10-CM

## 2022-11-19 DIAGNOSIS — L4050 Arthropathic psoriasis, unspecified: Secondary | ICD-10-CM | POA: Diagnosis not present

## 2022-11-19 DIAGNOSIS — L409 Psoriasis, unspecified: Secondary | ICD-10-CM | POA: Diagnosis not present

## 2022-11-19 DIAGNOSIS — Z9884 Bariatric surgery status: Secondary | ICD-10-CM

## 2022-11-19 DIAGNOSIS — M5030 Other cervical disc degeneration, unspecified cervical region: Secondary | ICD-10-CM

## 2022-11-19 DIAGNOSIS — Z8659 Personal history of other mental and behavioral disorders: Secondary | ICD-10-CM

## 2022-11-19 DIAGNOSIS — R5383 Other fatigue: Secondary | ICD-10-CM

## 2022-11-19 DIAGNOSIS — Z8709 Personal history of other diseases of the respiratory system: Secondary | ICD-10-CM

## 2022-11-19 DIAGNOSIS — S92414D Nondisplaced fracture of proximal phalanx of right great toe, subsequent encounter for fracture with routine healing: Secondary | ICD-10-CM

## 2022-11-19 DIAGNOSIS — M62838 Other muscle spasm: Secondary | ICD-10-CM

## 2022-11-19 DIAGNOSIS — Z8639 Personal history of other endocrine, nutritional and metabolic disease: Secondary | ICD-10-CM

## 2022-11-19 DIAGNOSIS — M8589 Other specified disorders of bone density and structure, multiple sites: Secondary | ICD-10-CM

## 2022-11-19 LAB — CBC WITH DIFFERENTIAL/PLATELET
Absolute Monocytes: 611 cells/uL (ref 200–950)
Basophils Absolute: 32 cells/uL (ref 0–200)
Basophils Relative: 0.5 %
Eosinophils Absolute: 302 cells/uL (ref 15–500)
Eosinophils Relative: 4.8 %
HCT: 34.9 % — ABNORMAL LOW (ref 35.0–45.0)
Hemoglobin: 11.9 g/dL (ref 11.7–15.5)
Lymphs Abs: 2010 cells/uL (ref 850–3900)
MCH: 31.9 pg (ref 27.0–33.0)
MCHC: 34.1 g/dL (ref 32.0–36.0)
MCV: 93.6 fL (ref 80.0–100.0)
MPV: 10.3 fL (ref 7.5–12.5)
Monocytes Relative: 9.7 %
Neutro Abs: 3345 cells/uL (ref 1500–7800)
Neutrophils Relative %: 53.1 %
Platelets: 256 10*3/uL (ref 140–400)
RBC: 3.73 10*6/uL — ABNORMAL LOW (ref 3.80–5.10)
RDW: 12.2 % (ref 11.0–15.0)
Total Lymphocyte: 31.9 %
WBC: 6.3 10*3/uL (ref 3.8–10.8)

## 2022-11-19 NOTE — Patient Instructions (Addendum)
Standing Labs We placed an order today for your standing lab work.   Please have your standing labs drawn in 1 month then every 3 months   Please have your labs drawn 2 weeks prior to your appointment so that the provider can discuss your lab results at your appointment, if possible.  Please note that you may see your imaging and lab results in MyChart before we have reviewed them. We will contact you once all results are reviewed. Please allow our office up to 72 hours to thoroughly review all of the results before contacting the office for clarification of your results.  WALK-IN LAB HOURS  Monday through Thursday from 8:00 am -12:30 pm and 1:00 pm-5:00 pm and Friday from 8:00 am-12:00 pm.  Patients with office visits requiring labs will be seen before walk-in labs.  You may encounter longer than normal wait times. Please allow additional time. Wait times may be shorter on  Monday and Thursday afternoons.  We do not book appointments for walk-in labs. We appreciate your patience and understanding with our staff.   Labs are drawn by [facility]. Please bring your co-pay at the time of your lab draw.  You may receive a bill from [facility] for your lab work.  Please note if you are on Hydroxychloroquine and and an order has been placed for a Hydroxychloroquine level,  you will need to have it drawn 4 hours or more after your last dose.  If you wish to have your labs drawn at another location, please call the office 24 hours in advance so we can fax the orders.  The office is located at [address], [place], [place] [address]   If you have any questions regarding directions or hours of operation,  please call [phone].   As a reminder, please drink plenty of water prior to coming for your lab work. Thanks!   Ixekizumab Injection What is this medication? IXEKIZUMAB (ix e KIZ ue mab) treats autoimmune conditions, such as psoriasis and arthritis. It works by slowing down an  overactive immune system. It is a monoclonal antibody. This medicine may be used for other purposes; ask your health care provider or pharmacist if you have questions. COMMON BRAND NAME(S): TALTZ What should I tell my care team before I take this medication? They need to know if you have any of these conditions: Immune system problems Infection, such as viral infection, chickenpox, cold sores, or herpes Recently received or are scheduled to receive a vaccine Tuberculosis, a positive skin test for tuberculosis, or recent close contact with someone who has tuberculosis An unusual or allergic reaction to ixekizumab, other medications, foods, dyes, or preservatives Pregnant or trying to get pregnant Breast-feeding How should I use this medication? This medication is injected under the skin. It is usually given by your care team in a hospital or clinic setting. It may also be given at home. If you get this medication at home, you will be taught how to prepare and give it. Use exactly as directed. Take it as directed on the prescription label at the same time every day. Keep taking it unless your care team tells you to stop. It is important that you put your used needles and syringes in a special sharps container. Do not put them in a trash can. If you do not have a sharps container, call your pharmacist or care team to get one. A special MedGuide will be given to you by the pharmacist with each prescription  and refill. If you are getting this medication in a hospital or clinic, a special MedGuide will be given to you before each treatment. Be sure to read this information carefully each time. Talk to your care team about the use of this medication in children. While it be prescribed for children as young as 6 years for selected conditions, precautions do apply. Overdosage: If you think you have taken too much of this medicine contact a poison control center or emergency room at once. NOTE: This  medicine is only for you. Do not share this medicine with others. What if I miss a dose? If you get this medication at the hospital or clinic: It is important not to miss your dose. Call your care team if you are unable to keep an appointment. If you give yourself the medication at home: If you miss a dose, take it as soon as you can. Then continue your normal schedule. Do not take double or extra doses. Call your care team with questions. What may interact with this medication? Do not take this medication with any of the following: Live virus vaccines This medication may also interact with the following: Inactivated vaccines This list may not describe all possible interactions. Give your health care provider a list of all the medicines, herbs, non-prescription drugs, or dietary supplements you use. Also tell them if you smoke, drink alcohol, or use illegal drugs. Some items may interact with your medicine. What should I watch for while using this medication? Visit your care team for regular checks on your progress. Tell your care team if your symptoms do not start to get better or if they get worse. You will be tested for tuberculosis (TB) before you start this medication. If your care team prescribes any medication for TB, you should start taking the TB medication before starting this medication. Make sure to finish the full course of TB medication. This medication may increase your risk of getting an infection. Call your care team for advice if you get a fever, chills, sore throat, or other symptoms of a cold or flu. Do not treat yourself. Try to avoid being around people who are sick. This medication can decrease the response to a vaccine. If you need to get vaccinated, tell your care team if you have received this medication within the last 6 months. Extra booster doses may be needed. Talk to your care team to see if a different vaccination schedule is needed. What side effects may I notice from  receiving this medication? Side effects that you should report to your care team as soon as possible: Allergic reactions--skin rash, itching, hives, swelling of the face, lips, tongue, or throat Infection--fever, chills, cough, sore throat, wounds that don't heal, pain or trouble when passing urine, general feeling of discomfort or being unwell Sudden or severe stomach pain, bloody diarrhea, fever, nausea, vomiting Side effects that usually do not require medical attention (report to your care team if they continue or are bothersome): Nausea Pain, redness, or irritation at injection site Runny or stuffy nose Sore throat This list may not describe all possible side effects. Call your doctor for medical advice about side effects. You may report side effects to FDA at 1-800-FDA-1088. Where should I keep my medication? Keep out of the reach of children and pets. Store in the refrigerator. Do not freeze. Do not shake. Keep this medication in the original container. Protect from light. Use the dose within 30 minutes of removing it from the  refrigerator. Get rid of any unused medication after the expiration date. To get rid of medications that are no longer needed or have expired: Take the medication to a medication take-back program. Check with your pharmacy or law enforcement to find a location. If you cannot return the medication, ask your pharmacist or care team how to get rid of this medication safely. NOTE: This sheet is a summary. It may not cover all possible information. If you have questions about this medicine, talk to your doctor, pharmacist, or health care provider.  2024 Elsevier/Gold Standard (2021-06-26 00:00:00)

## 2022-11-20 ENCOUNTER — Telehealth: Payer: Self-pay | Admitting: Pharmacist

## 2022-11-20 NOTE — Telephone Encounter (Signed)
Submitted a Prior Authorization request to [employer] for TALTZ via CoverMyMeds. Will update once we receive a response.  Key: [id number]  [name], PharmD, MPH, BCPS, CPP Clinical Pharmacist (Rheumatology and Pulmonology)

## 2022-11-20 NOTE — Telephone Encounter (Signed)
Received notification from [employer] regarding a prior authorization for TALTZ. Authorization has been APPROVED from 10/21/22 to 05/19/23.   Unable to run test claim because patient must fill through [facility]: [phone]  Authorization # [id number]  Patient will need to be enrolled into Taltz copay card and then schedule for new start visit  [name], PharmD, MPH, BCPS, CPP Clinical Pharmacist (Rheumatology and Pulmonology)

## 2022-11-20 NOTE — Progress Notes (Signed)
ESR WNL CMP WNL RBC count is borderline low. Hematocrit borderline low. Hemoglobin WNL. Rest of CBC WNL.   We will continue to monitor.

## 2022-11-24 NOTE — Telephone Encounter (Signed)
Spoke with patient. Her last Enbrel dose was on 11/17/22. She did not take Enbrel today.  Enrolled patient into Taltz copay card: RXBIN: [id number] PCN: [id number] GRP: [id number] ID: [id number] Expiration Date: 04/20/2025  Patient is scheduled for Taltz new start on 11/27/22. Will use sample  [name], PharmD, MPH, BCPS, CPP Clinical Pharmacist (Rheumatology and Pulmonology)

## 2022-11-24 NOTE — Progress Notes (Signed)
SPEP normal.  Hepatitis B and C negative.  TB gold negative. IgA slightly elevated, rest of immunoglobulins WNL.  CRP and ESR WNL.

## 2022-11-24 NOTE — Progress Notes (Unsigned)
Pharmacy Note  Subjective:   Patient presents to clinic today to receive first dose of Taltz for PsA/Pso.  She is transitioning from Enbrel (last dose was on 11/17/22). She currently takes methotrexate and hydroxychloroquine.  Patient running a fever or have signs/symptoms of infection? {yes/no:20286}  Patient currently on antibiotics for the treatment of infection? {yes/no:20286}  Patient have any upcoming invasive procedures/surgeries? {yes/no:20286}  Objective: CMP     Component Value Date/Time   NA 139 11/19/2022 1533   NA 142 09/29/2013 0901   K 5.0 11/19/2022 1533   CL 102 11/19/2022 1533   CO2 27 11/19/2022 1533   GLUCOSE 92 11/19/2022 1533   BUN 8 11/19/2022 1533   BUN 9 09/29/2013 0901   CREATININE 0.86 11/19/2022 1533   CALCIUM 9.2 11/19/2022 1533   PROT 6.7 11/19/2022 1533   PROT 6.7 11/19/2022 1533   PROT 6.5 09/29/2013 0901   ALBUMIN 4.3 10/29/2016 1140   ALBUMIN 4.4 09/29/2013 0901   AST 21 11/19/2022 1533   ALT 20 11/19/2022 1533   ALKPHOS 113 10/29/2016 1140   BILITOT 0.4 11/19/2022 1533   GFRNONAA 110 01/10/2019 1258   GFRAA 128 01/10/2019 1258    CBC    Component Value Date/Time   WBC 6.3 11/19/2022 1533   RBC 3.73 (L) 11/19/2022 1533   HGB 11.9 11/19/2022 1533   HCT 34.9 (L) 11/19/2022 1533   PLT 256 11/19/2022 1533   MCV 93.6 11/19/2022 1533   MCH 31.9 11/19/2022 1533   MCHC 34.1 11/19/2022 1533   RDW 12.2 11/19/2022 1533   RDW 17.1 (H) 09/29/2013 0901   LYMPHSABS 2,010 11/19/2022 1533   LYMPHSABS 1.3 09/29/2013 0901   MONOABS 540 10/29/2016 1140   EOSABS 302 11/19/2022 1533   EOSABS 0.0 09/29/2013 0901   BASOSABS 32 11/19/2022 1533   BASOSABS 0.0 09/29/2013 0901    Baseline Immunosuppressant Therapy Labs TB GOLD    Latest Ref Rng & Units 11/19/2022    3:33 PM  Quantiferon TB Gold  Quantiferon TB Gold Plus NEGATIVE NEGATIVE    Hepatitis Panel    Latest Ref Rng & Units 11/19/2022    3:33 PM  Hepatitis  Hep B Surface Ag  NON-REACTIVE NON-REACTIVE   Hep B IgM NON-REACTIVE NON-REACTIVE   Hep C Ab NON-REACTIVE NON-REACTIVE    HIV No results found for: "HIV" Immunoglobulins    Latest Ref Rng & Units 11/19/2022    3:33 PM  Immunoglobulin Electrophoresis  IgA  47 - 310 mg/dL 161   IgG 096 - 0,454 mg/dL 0,981   IgM 50 - 191 mg/dL 82    SPEP    Latest Ref Rng & Units 11/19/2022    3:33 PM  Serum Protein Electrophoresis  Total Protein 6.1 - 8.1 g/dL 6.1 - 8.1 g/dL 6.7    6.7   Albumin 3.8 - 4.8 g/dL 3.9   Alpha-1 0.2 - 0.3 g/dL 0.3   Alpha-2 0.5 - 0.9 g/dL 0.7   Beta Globulin 0.4 - 0.6 g/dL 0.5   Beta 2 0.2 - 0.5 g/dL 0.4   Gamma Globulin 0.8 - 1.7 g/dL 0.9   Interpretation  --    Chest x-ray: 10/01/2017 - No active cardiopulmonary disease.  Assessment/Plan:  Reviewed importance of holding TALTZ with signs/symptoms of an infections, if antibiotics are prescribed to treat an active infection, and with invasive procedures  Demonstrated proper injection technique with TALTZ demo device  Patient able to demonstrate proper injection technique using the teach back method.  Patient  self injected in the {injsitedsg:28167} and {injsitedsg:28167} with:  Sample Medication: Taltz 80mg /mL autoinjector x 2 pens NDC: *** Lot: *** Expiration: ***  Patient tolerated well.  Observed for 30 mins in office for adverse reaction and ***.   Patient is to return in 1 month for labs and 6-8 weeks for follow-up appointment.  Standing orders for CBC/CMP and *** placed.  TB gold will be monitored yearly.  Referral to *** Dermatology placed today for yearly skin checks while on TNF inhibitor due to risk for non melanoma skin cancer  TALTZ approved through insurance .   Rx sent to: [facility]: [phone].  Patient provided with pharmacy phone number and advised to call later this week to schedule shipment to home.  Patient will continue TALTZ 160 mg SQ (administered in clinic today), then 80 mg SQ on weeks  2, 4, 6, 8, 10, 12 then 80 mg every 28 days thereafter. She will continue methotrexate 6 tabs p.o. once weekly (with folic acid 2 mg daily, hydroxychloroquine 200 mg p.o. 1 tab in the morning and half tablet in the evening.   All questions encouraged and answered.  Instructed patient to call with any further questions or concerns.  [name], PharmD, MPH, BCPS, CPP Clinical Pharmacist (Rheumatology and Pulmonology)  11/24/2022 3:35 PM

## 2022-11-24 NOTE — Patient Instructions (Incomplete)
Your next TALTZ dose is due on 8/22, 9/5, 9/19, 10/3, 10/17, 10/31 then every 4 weeks thereafter (starting on 03/19/23)  CONTINUE methotrexate 6 tabs by mouth once weekly (with folic acid 2mg  daily). CONTINUE hydroxychloroquine 1 tab by mouth in morning and half tablet in evening  HOLD TALTZ and METHOTREXATE if you have signs or symptoms of an infection. You can resume once you feel better or back to your baseline. HOLD TALTZ and METHOTREXATE if you start antibiotics to treat an infection. HOLD TALTZ and METHOTREXATE around the time of surgery/procedures. Your surgeon will be able to provide recommendations on when to hold BEFORE and when you are cleared to RESUME.  Pharmacy information: Your prescription will be shipped from [facility]. Their phone number is [phone] Please call to schedule shipment and confirm address. They will mail your medication to your home.  Cost information: Your copay should be affordable. If you call the pharmacy and it is not affordable, please double-check that they are billing through your copay card as secondary coverage. That copay card information is: RXBIN: [id number] PCN: [id number] GRP: [id number] ID: [id number] Expiration Date: 04/20/2025  Labs are due in 1 month then every 3 months. Lab hours are from Monday to Thursday 8am-12:30pm and 1pm-5pm and Friday 8am-12pm. You do not need an appointment if you come for labs during these times.  How to manage an injection site reaction: Remember the 5 C's: COUNTER - leave on the counter at least 30 minutes but up to overnight to bring medication to room temperature. This may help prevent stinging COLD - place something cold (like an ice gel pack or cold water bottle) on the injection site just before cleansing with alcohol. This may help reduce pain CLARITIN - use Claritin (generic name is loratadine) for the first two weeks of treatment or the day of, the day before, and the day after injecting.  This will help to minimize injection site reactions CORTISONE CREAM - apply if injection site is irritated and itching CALL ME - if injection site reaction is bigger than the size of your fist, looks infected, blisters, or if you develop hives

## 2022-11-27 ENCOUNTER — Ambulatory Visit: Payer: BC Managed Care – PPO | Attending: Rheumatology | Admitting: Pharmacist

## 2022-11-27 DIAGNOSIS — L409 Psoriasis, unspecified: Secondary | ICD-10-CM

## 2022-11-27 DIAGNOSIS — Z79899 Other long term (current) drug therapy: Secondary | ICD-10-CM

## 2022-11-27 DIAGNOSIS — L4050 Arthropathic psoriasis, unspecified: Secondary | ICD-10-CM

## 2022-11-27 DIAGNOSIS — Z7189 Other specified counseling: Secondary | ICD-10-CM

## 2022-11-27 MED ORDER — TALTZ 80 MG/ML ~~LOC~~ SOAJ
SUBCUTANEOUS | 2 refills | Status: DC
Start: 2022-11-27 — End: 2023-01-26

## 2022-12-03 ENCOUNTER — Other Ambulatory Visit: Payer: Self-pay | Admitting: Physician Assistant

## 2022-12-03 DIAGNOSIS — L4050 Arthropathic psoriasis, unspecified: Secondary | ICD-10-CM

## 2022-12-03 NOTE — Telephone Encounter (Signed)
Last Fill: 09/03/2022  Eye exam: 04/09/2022 WNL    Labs: 11/19/2022 CMP WNL RBC count is borderline low. Hematocrit borderline low. Hemoglobin WNL. Rest of CBC WNL.   Next Visit: 01/21/2023  Last Visit: 11/19/2022  DX: Psoriatic arthropathy   Current Dose per office note 11/19/2022:  Plaquenil 200 mg 1 tab in the morning and half tablet in the evening.   Okay to refill Plaquenil?

## 2022-12-04 ENCOUNTER — Telehealth: Payer: Self-pay | Admitting: *Deleted

## 2022-12-04 NOTE — Telephone Encounter (Signed)
Patient contacted the office stating she started Taltz on 11/27/2022. Patient states they day after her appointment she tried to order her medication from the pharmacy. Patient states she has tried multiple times and she is being advised they are waiting approval from the insurance. Patient states she is due to go on vacation Saturday and will be gone for a week. Patient states she will be due for an injection while on vacation. Please reach out to patient.

## 2022-12-05 NOTE — Telephone Encounter (Signed)
Called [facility] to troubleshoot Taltz rx. Per rep, they are waiting for clearance team to release the rx. That means that they've had test claim go through insurance so should be no issues with insurance. They have already placed an expedited request for this  Once cleared, they will call patient. If patient travelling nationally, then [facility] will be able to ship wherever she is nationally.  ATC patient to [facility]. Advised of above. Requested she call clinic if stopping by for sample and that she will have to stop by clinic before 12pm on Friday   [name], PharmD, MPH, BCPS, CPP Clinical Pharmacist (Rheumatology and Pulmonology)

## 2022-12-08 NOTE — Telephone Encounter (Signed)
Per [facility] portal, Taltz rx has shipped to patient on Sat, 12/06/22  [name], PharmD, MPH, BCPS, CPP Clinical Pharmacist (Rheumatology and Pulmonology)

## 2022-12-17 ENCOUNTER — Other Ambulatory Visit: Payer: Self-pay | Admitting: Physician Assistant

## 2022-12-29 ENCOUNTER — Telehealth: Payer: Self-pay | Admitting: *Deleted

## 2022-12-29 NOTE — Telephone Encounter (Signed)
Please advise patient to discontinue taltz and to schedule a follow up visit to discuss medication options.

## 2022-12-29 NOTE — Telephone Encounter (Signed)
I called patient, patient verbalized understanding, patient will schedule appt w/front desk staff.

## 2022-12-29 NOTE — Telephone Encounter (Signed)
Patient contacted the office stating she is having struggles with Taltz. She states she is having increased bowel movements and extremely loose. Patient states she has had accidents in her sleep a few times. Patient states the fatigue has gotten worse. Patient states she has been feeling dehydrated and nauseous. Patient states she has also continued to have swelling. Patient states these symptoms started 1 week after she took her initial dose of Taltz and then they died down. She states that the ramped back up after she took the next dose of Taltz. Please advise.

## 2023-01-02 NOTE — Progress Notes (Unsigned)
Office Visit Note  Patient: Michelle [name]             Date of Birth: 1975/12/24           MRN: [id number]             PCP: Lester Callaway., MD Referring: Lester [place]., MD Visit Date: 01/07/2023 Occupation: @GUAROCC @  Subjective:  No chief complaint on file.   History of Present Illness: Michelle [name] is a 47 y.o. female ***  Taltz 8/8   Activities of Daily Living:  Patient reports morning stiffness for *** {minute/hour:19697}.   Patient {ACTIONS;DENIES/REPORTS:21021675::"Denies"} nocturnal pain.  Difficulty dressing/grooming: {ACTIONS;DENIES/REPORTS:21021675::"Denies"} Difficulty climbing stairs: {ACTIONS;DENIES/REPORTS:21021675::"Denies"} Difficulty getting out of chair: {ACTIONS;DENIES/REPORTS:21021675::"Denies"} Difficulty using hands for taps, buttons, cutlery, and/or writing: {ACTIONS;DENIES/REPORTS:21021675::"Denies"}  No Rheumatology ROS completed.   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   Psoriatic arthropathy (HCC) 04/17/2016   Psoriasis 04/17/2016   ANA positive 04/17/2016   Other fatigue 04/17/2016   DDD (degenerative disc disease), cervical 04/17/2016   DDD (degenerative disc disease), lumbar 04/17/2016   History of diabetes mellitus 04/17/2016   History of migraine 04/17/2016   Fatty liver 04/17/2016   Diabetes (HCC)    Depression    Fibromyalgia    Muscle cramps     Past Medical History:  Diagnosis Date   Depression    Diabetes (HCC)    Fibromyalgia    Migraine    Muscle cramps    Psoriasis     Family History  Problem Relation Age of Onset   Cancer Mother    Cancer Father    Diabetes Sister    Cancer Brother     Past Surgical History:  Procedure Laterality Date   ABDOMINAL HYSTERECTOMY     CESAREAN SECTION     CHOLECYSTECTOMY     FACET JOINT INJECTION     GASTRIC BYPASS     radiofrequency injections     neck, pain management did injections   SI Joint Injection     tummy tuck  03/24/2018   Social History   Social History Narrative   Patient lives at home alone with her husband and she works full time disability .    Right handed.   Caffeine one times per day.   Immunization History  Administered Date(s) Administered   Influenza Inj Mdck Quad Pf 02/13/2017   Influenza,inj,Quad PF,6+ Mos 01/23/2016, 01/27/2018   Influenza-Unspecified 01/23/2016   Moderna Sars-Covid-2 Vaccination 04/17/2019, 05/21/2019, 07/24/2020   Pneumococcal Polysaccharide-23 01/20/2012   Tdap 07/28/2012     Objective: Vital Signs: There were no vitals taken for this visit.   Physical Exam   Musculoskeletal Exam: ***  CDAI Exam: CDAI Score: -- Patient Global: --; Provider Global: -- Swollen: --; Tender: -- Joint Exam 01/07/2023   No joint exam has been documented for this visit   There is currently no information documented on the homunculus. Go to the Rheumatology activity and complete the homunculus joint exam.  Investigation: No additional findings.  Imaging: No results found.  Recent Labs: Lab Results  Component Value Date   WBC 6.3 11/19/2022   HGB 11.9 11/19/2022   PLT 256 11/19/2022   NA  139 11/19/2022   K 5.0 11/19/2022   CL 102 11/19/2022   CO2 27 11/19/2022   GLUCOSE 92 11/19/2022   BUN 8 11/19/2022   CREATININE 0.86 11/19/2022   BILITOT 0.4 11/19/2022   ALKPHOS 113 10/29/2016   AST 21 11/19/2022   ALT 20 11/19/2022   PROT 6.7 11/19/2022   PROT 6.7 11/19/2022   ALBUMIN 4.3 10/29/2016   CALCIUM 9.2 11/19/2022   GFRAA 128 01/10/2019   QFTBGOLDPLUS NEGATIVE 11/19/2022    Speciality Comments: PLQ Eye Exam:  04/09/2022 WNL at [facility]. Follow up 1 year.    Enbrel stopped 11/17/22; Taltz started 11/27/22 MTX + plaquenil in combination with biologics  Procedures:  No procedures performed Allergies: Zoledronic acid, Duloxetine, Flunisolide, Vilazodone, Latex, and Tape   Assessment / Plan:     Visit Diagnoses: Psoriatic arthropathy (HCC)  Psoriasis  High risk medication use  DDD (degenerative disc disease), lumbar  Fibromyalgia  Trapezius muscle spasm  Other fatigue  DDD (degenerative disc disease), cervical  Osteopenia of multiple sites  Closed nondisplaced fracture of proximal phalanx of right great toe with routine healing, subsequent encounter  Interstitial cystitis  History of diabetes mellitus  History of gastric bypass  History of asthma  History of hypothyroidism  History of depression  Orders: No orders of the defined types were placed in this encounter.  No orders of the defined types were placed in this encounter.   Face-to-face time spent with patient was *** minutes. Greater than 50% of time was spent in counseling and coordination of care.  Follow-Up Instructions: No follow-ups on file.   Gearldine Bienenstock, PA-C  Note - This record has been created using Dragon software.  Chart creation errors have been sought, but may not always  have been located. Such creation errors do not reflect on  the standard of medical care.

## 2023-01-07 ENCOUNTER — Encounter: Payer: Self-pay | Admitting: Physician Assistant

## 2023-01-07 ENCOUNTER — Ambulatory Visit: Payer: BC Managed Care – PPO | Attending: Physician Assistant | Admitting: Physician Assistant

## 2023-01-07 ENCOUNTER — Telehealth: Payer: Self-pay | Admitting: Pharmacist

## 2023-01-07 VITALS — BP 151/90 | HR 76 | Resp 14 | Ht 64.0 in | Wt 144.0 lb

## 2023-01-07 DIAGNOSIS — L409 Psoriasis, unspecified: Secondary | ICD-10-CM

## 2023-01-07 DIAGNOSIS — R5383 Other fatigue: Secondary | ICD-10-CM

## 2023-01-07 DIAGNOSIS — M797 Fibromyalgia: Secondary | ICD-10-CM

## 2023-01-07 DIAGNOSIS — Z8709 Personal history of other diseases of the respiratory system: Secondary | ICD-10-CM

## 2023-01-07 DIAGNOSIS — L4050 Arthropathic psoriasis, unspecified: Secondary | ICD-10-CM | POA: Diagnosis not present

## 2023-01-07 DIAGNOSIS — M5030 Other cervical disc degeneration, unspecified cervical region: Secondary | ICD-10-CM

## 2023-01-07 DIAGNOSIS — M62838 Other muscle spasm: Secondary | ICD-10-CM

## 2023-01-07 DIAGNOSIS — M8589 Other specified disorders of bone density and structure, multiple sites: Secondary | ICD-10-CM

## 2023-01-07 DIAGNOSIS — N3010 Interstitial cystitis (chronic) without hematuria: Secondary | ICD-10-CM

## 2023-01-07 DIAGNOSIS — S92414D Nondisplaced fracture of proximal phalanx of right great toe, subsequent encounter for fracture with routine healing: Secondary | ICD-10-CM

## 2023-01-07 DIAGNOSIS — Z79899 Other long term (current) drug therapy: Secondary | ICD-10-CM | POA: Diagnosis not present

## 2023-01-07 DIAGNOSIS — M5136 Other intervertebral disc degeneration, lumbar region: Secondary | ICD-10-CM | POA: Diagnosis not present

## 2023-01-07 DIAGNOSIS — Z8639 Personal history of other endocrine, nutritional and metabolic disease: Secondary | ICD-10-CM

## 2023-01-07 DIAGNOSIS — Z8659 Personal history of other mental and behavioral disorders: Secondary | ICD-10-CM

## 2023-01-07 DIAGNOSIS — Z9884 Bariatric surgery status: Secondary | ICD-10-CM

## 2023-01-07 NOTE — Patient Instructions (Signed)
Golimumab Injection What is this medication? GOLIMUMAB (goe LIM ue mab) treats autoimmune conditions, such as arthritis and ulcerative colitis. It works by slowing down an overactive immune system. It belongs to a group of medications called TNF inhibitors. It is a monoclonal antibody. This medicine may be used for other purposes; ask your health care provider or pharmacist if you have questions. COMMON BRAND NAME(S): Simponi, SIMPONI ARIA What should I tell my care team before I take this medication? They need to know if you have any of these conditions: Cancer Diabetes Guillain-Barre syndrome Heart failure Hepatitis B or history of hepatitis B infection Immune system problems Infection or history of infections Low blood counts, such as low white cell, platelet, or red cell counts Multiple sclerosis Psoriasis Recently received or scheduled to receive a vaccine Tuberculosis, a positive skin test for tuberculosis, or recent close contact with someone who has tuberculosis An unusual or allergic reaction to golimumab, other medications, latex, rubber, foods, dyes, or preservatives Pregnant or trying to get pregnant Breast-feeding How should I use this medication? This medication is injected into a vein or under the skin. It is usually given by your care team in a hospital or clinic setting. It may also be given at home. If you get this medication at home, you will be taught how to prepare and give it. Use exactly as directed. Take it as directed on the prescription label at the same time every day. Keep taking it unless your care team tells you to stop. It is important that you put your used injectors, needles, and syringes in a special sharps container. Do not put them in a trash can. If you do not have a sharps container, call your pharmacist or care team to get one. A special MedGuide will be given to you by the pharmacist with each prescription and refill. Be sure to read this information  carefully each time. If you get this medication in a hospital or clinic setting, a special MedGuide will be given to you before each treatment. Be sure to read this information carefully each time. Talk to your care team about the use of this medication in children. While it may be prescribed for children as young as 2 years for selected conditions, precautions do apply. Overdosage: If you think you have taken too much of this medicine contact a poison control center or emergency room at once. NOTE: This medicine is only for you. Do not share this medicine with others. What if I miss a dose? If you get this medication at the hospital or clinic: It is important not to miss your dose. Call your care team if you are unable to keep an appointment. If you give yourself this medication at home: If you miss a dose, take it as soon as you can. If it is almost time for your next dose, take only that dose. Do not take double or extra doses. Call your care team with questions. What may interact with this medication? Do not take this medication with any of the following: Abatacept Adalimumab Anakinra Certolizumab Etanercept Infliximab Live virus vaccines Rilonacept Rituximab Tocilizumab This medication may also interact with the following: Cyclosporine Theophylline Vaccines Warfarin This list may not describe all possible interactions. Give your health care provider a list of all the medicines, herbs, non-prescription drugs, or dietary supplements you use. Also tell them if you smoke, drink alcohol, or use illegal drugs. Some items may interact with your medicine. What should I watch for while  using this medication? Visit your care team for regular checks on your progress. Tell your care team if your symptoms do not start to get better or if they get worse. You will be tested for tuberculosis (TB) before you start this medication. If your care team prescribes any medication for TB, you should start  taking the TB medication before starting this medication. Make sure to finish the full course of TB medication. This medication may increase your risk of getting an infection. Call you care team if you get a fever, chills, sore throat, or other symptoms of a cold or flu. Do not treat yourself. Try to avoid being around people who are sick. Talk to your care team about your risk of cancer. You may be more at risk for certain types of cancers if you take this medication. What side effects may I notice from receiving this medication? Side effects that you should report to your care team as soon as possible: Allergic reactions--skin rash, itching, hives, swelling of the face, lips, tongue, or throat Aplastic anemia--unusual weakness or fatigue, dizziness, headache, trouble breathing, increased bleeding or bruising Body pain, tingling, or numbness Heart failure--shortness of breath, swelling of the ankles, feet, or hands, sudden weight gain, unusual weakness or fatigue Infection--fever, chills, cough, sore throat, wounds that don't heal, pain or trouble when passing urine, general feeling of discomfort or being unwell Lupus-like syndrome--joint pain, swelling, or stiffness, butterfly-shaped rash on the face, rashes that get worse in the sun, fever, unusual weakness or fatigue Unusual bruising or bleeding Side effects that usually do not require medical attention (report to your care team if they continue or are bothersome): Dizziness Fever Increase in blood pressure Runny or stuffy nose Sore throat This list may not describe all possible side effects. Call your doctor for medical advice about side effects. You may report side effects to FDA at 1-800-FDA-1088. Where should I keep my medication? Infusions will be given in a hospital or clinic. They will not be stored at home. Storage for syringes or injectors given under the skin and stored at home: Keep out of the reach of children and  pets. Refrigeration (preferred): Store in the refrigerator. Do not freeze. Do not shake. Keep this medication in the original container. Protect from light. Get rid of any unused medication after the expiration date. Room Temperature: This medication may be stored at room temperature between 20 and 25 degrees C (68 and 77 degrees F) for up to 30 days. Do not put the medication back in the refrigerator after it reaches room temperature. Keep this medication in the original container. Protect from light. Do not freeze. Do not shake. If it is stored at room temperature, get rid any unused medication after 30 days or after it expires, whichever comes first. To get rid of medications that are no longer needed or have expired: Take the medication to a medication take-back program. Check with your pharmacy or law enforcement to find a location. If you cannot return the medication, ask your pharmacist or care team how to get rid of this medication safely. NOTE: This sheet is a summary. It may not cover all possible information. If you have questions about this medicine, talk to your doctor, pharmacist, or health care provider.  2024 Elsevier/Gold Standard (2021-06-21 00:00:00)

## 2023-01-07 NOTE — Progress Notes (Signed)
Pharmacy Note  Subjective: Patient presents today to [facility] for follow up office visit.  Patient seen by the pharmacist for counseling on Simponi SQ for psoriatic arthritis and plaque psoriasis.    She has taken Humira and Enbrel in past. Recently started Cosentyx but was experiencing dose-related diarrhea (*last dose 12/25/2022)  Diagnosis of heart failure: No  Objective:  CBC    Component Value Date/Time   WBC 6.3 11/19/2022 1533   RBC 3.73 (L) 11/19/2022 1533   HGB 11.9 11/19/2022 1533   HCT 34.9 (L) 11/19/2022 1533   PLT 256 11/19/2022 1533   MCV 93.6 11/19/2022 1533   MCH 31.9 11/19/2022 1533   MCHC 34.1 11/19/2022 1533   RDW 12.2 11/19/2022 1533   RDW 17.1 (H) 09/29/2013 0901   LYMPHSABS 2,010 11/19/2022 1533   LYMPHSABS 1.3 09/29/2013 0901   MONOABS 540 10/29/2016 1140   EOSABS 302 11/19/2022 1533   EOSABS 0.0 09/29/2013 0901   BASOSABS 32 11/19/2022 1533   BASOSABS 0.0 09/29/2013 0901     CMP     Component Value Date/Time   NA 139 11/19/2022 1533   NA 142 09/29/2013 0901   K 5.0 11/19/2022 1533   CL 102 11/19/2022 1533   CO2 27 11/19/2022 1533   GLUCOSE 92 11/19/2022 1533   BUN 8 11/19/2022 1533   BUN 9 09/29/2013 0901   CREATININE 0.86 11/19/2022 1533   CALCIUM 9.2 11/19/2022 1533   PROT 6.7 11/19/2022 1533   PROT 6.7 11/19/2022 1533   PROT 6.5 09/29/2013 0901   ALBUMIN 4.3 10/29/2016 1140   ALBUMIN 4.4 09/29/2013 0901   AST 21 11/19/2022 1533   ALT 20 11/19/2022 1533   ALKPHOS 113 10/29/2016 1140   BILITOT 0.4 11/19/2022 1533   GFRNONAA 110 01/10/2019 1258   GFRAA 128 01/10/2019 1258     Baseline Immunosuppressant Therapy Labs TB GOLD    Latest Ref Rng & Units 11/19/2022    3:33 PM  Quantiferon TB Gold  Quantiferon TB Gold Plus NEGATIVE NEGATIVE    Hepatitis Panel    Latest Ref Rng & Units 11/19/2022    3:33 PM  Hepatitis  Hep B Surface Ag NON-REACTIVE NON-REACTIVE   Hep B IgM NON-REACTIVE NON-REACTIVE   Hep C Ab  NON-REACTIVE NON-REACTIVE    HIV No results found for: "HIV" Immunoglobulins    Latest Ref Rng & Units 11/19/2022    3:33 PM  Immunoglobulin Electrophoresis  IgA  47 - 310 mg/dL 191   IgG 478 - 2,956 mg/dL 2,130   IgM 50 - 865 mg/dL 82    SPEP    Latest Ref Rng & Units 11/19/2022    3:33 PM  Serum Protein Electrophoresis  Total Protein 6.1 - 8.1 g/dL 6.1 - 8.1 g/dL 6.7    6.7   Albumin 3.8 - 4.8 g/dL 3.9   Alpha-1 0.2 - 0.3 g/dL 0.3   Alpha-2 0.5 - 0.9 g/dL 0.7   Beta Globulin 0.4 - 0.6 g/dL 0.5   Beta 2 0.2 - 0.5 g/dL 0.4   Gamma Globulin 0.8 - 1.7 g/dL 0.9   Interpretation  --    Chest x-ray: 10/01/2017 - No active cardiopulmonary disease  Does patient have diagnosis of heart failure?  No  Assessment/Plan: She denies personal and family history of IBD. She is established with GI and has been advised that she will likely need to f/u with them f persistent GI symptoms.  Counseled patient that Simponi Jarold Song is a TNF blocking  agent.  Counseled patient on purpose, proper use, and adverse effects of Simponi.  Reviewed the most common adverse effects including infections, headache, and injection site reactions. Discussed that there is the possibility of an increased risk of malignancy including non-melanoma skin cancer but it is not well understood if this increased risk is due to the medication or the disease state.  Advised patient to get yearly dermatology exams due to risk of skin cancer. Counseled patient that Simponi should be held prior to scheduled surgery.  Counseled patient to avoid live vaccines while on Simponi.  Recommend annual influenza, PCV 15 or PCV20 or Pneumovax 23, and Shingrix as indicated.  Reviewed the importance of regular labs while on Simponi therapy. Will monitor CBC and CMP 1 month after starting and then every 3 months routinely thereafter. Will monitor TB gold annually. Standing orders placed. Provided patient with medication education material and answered  all questions. Patient consented to Simponi.  Will upload consent into the media tab. Advised initial injection must be administered in office.  Patient verbalized understanding.  Patient dose will be Simponi 50 mg every 28 days.  Prescription pending lab results and/or insurance approval.   [name], PharmD, MPH, BCPS, CPP Clinical Pharmacist (Rheumatology and Pulmonology)

## 2023-01-07 NOTE — Telephone Encounter (Signed)
Pending OV not from today, please start Simponi SQ BIV  Dose: 50mg  SQ every 28 days  [name], PharmD, MPH, BCPS, CPP Clinical Pharmacist (Rheumatology and Pulmonology)

## 2023-01-07 NOTE — Telephone Encounter (Signed)
Submitted a Prior Authorization request to [employer] for SIMPONI SQ via CoverMyMeds. Will update once we receive a response.  Key: [id number]

## 2023-01-08 LAB — COMPLETE METABOLIC PANEL WITH GFR
AG Ratio: 2 (calc) (ref 1.0–2.5)
ALT: 15 U/L (ref 6–29)
AST: 23 U/L (ref 10–35)
Albumin: 4.5 g/dL (ref 3.6–5.1)
Alkaline phosphatase (APISO): 87 U/L (ref 31–125)
BUN/Creatinine Ratio: 10 (calc) (ref 6–22)
BUN: 6 mg/dL — ABNORMAL LOW (ref 7–25)
CO2: 30 mmol/L (ref 20–32)
Calcium: 9.3 mg/dL (ref 8.6–10.2)
Chloride: 97 mmol/L — ABNORMAL LOW (ref 98–110)
Creat: 0.59 mg/dL (ref 0.50–0.99)
Globulin: 2.3 g/dL (calc) (ref 1.9–3.7)
Glucose, Bld: 55 mg/dL — ABNORMAL LOW (ref 65–99)
Potassium: 4.3 mmol/L (ref 3.5–5.3)
Sodium: 136 mmol/L (ref 135–146)
Total Bilirubin: 0.4 mg/dL (ref 0.2–1.2)
Total Protein: 6.8 g/dL (ref 6.1–8.1)
eGFR: 110 mL/min/{1.73_m2} (ref >=60)

## 2023-01-08 LAB — CBC WITH DIFFERENTIAL/PLATELET
Absolute Monocytes: 570 cells/uL (ref 200–950)
Basophils Absolute: 18 cells/uL (ref 0–200)
Basophils Relative: 0.3 %
Eosinophils Absolute: 198 cells/uL (ref 15–500)
Eosinophils Relative: 3.3 %
HCT: 37.2 % (ref 35.0–45.0)
Hemoglobin: 12.1 g/dL (ref 11.7–15.5)
Lymphs Abs: 2184 cells/uL (ref 850–3900)
MCH: 31.1 pg (ref 27.0–33.0)
MCHC: 32.5 g/dL (ref 32.0–36.0)
MCV: 95.6 fL (ref 80.0–100.0)
MPV: 10.2 fL (ref 7.5–12.5)
Monocytes Relative: 9.5 %
Neutro Abs: 3030 cells/uL (ref 1500–7800)
Neutrophils Relative %: 50.5 %
Platelets: 241 10*3/uL (ref 140–400)
RBC: 3.89 10*6/uL (ref 3.80–5.10)
RDW: 11.9 % (ref 11.0–15.0)
Total Lymphocyte: 36.4 %
WBC: 6 10*3/uL (ref 3.8–10.8)

## 2023-01-08 NOTE — Progress Notes (Signed)
CBC WNL. CMP stable.

## 2023-01-09 NOTE — Telephone Encounter (Signed)
Received notification from [employer] regarding a prior authorization for SIMPONI SQ. Authorization has been APPROVED from 01/08/23 to 07/08/23.  Unable to run test claim because patient must fill through [facility]: [phone]  Authorization # [id number]  Patient enrolled into Simponi SQ copay card:  BIN: [id number] Group: [id number] ID: [id number] NO PCN Phone: [phone]  Patient can be scheduled for Simponi SQ new start using sample  [name], PharmD, MPH, BCPS, CPP Clinical Pharmacist (Rheumatology and Pulmonology)

## 2023-01-12 NOTE — Telephone Encounter (Signed)
ATC patient to schedule Simponi new start. Unable to reach. Left VM  [name], PharmD, MPH, BCPS, CPP Clinical Pharmacist (Rheumatology and Pulmonology)

## 2023-01-14 NOTE — Telephone Encounter (Signed)
Patient scheduled for Simponi new start on 01/26/23  Michelle [name], PharmD, MPH, BCPS, CPP Clinical Pharmacist (Rheumatology and Pulmonology)

## 2023-01-16 ENCOUNTER — Other Ambulatory Visit: Payer: Self-pay | Admitting: Physician Assistant

## 2023-01-16 NOTE — Telephone Encounter (Signed)
Last Fill: 10/15/2022  Labs: 01/07/2023 CBC WNL CMP stable  Next Visit: 03/11/2023  Last Visit: 01/07/2023  DX: Psoriatic arthropathy   Current Dose per office note 01/07/2023: methotrexate 6 tabs po once weekly   Okay to refill Methotrexate?

## 2023-01-21 ENCOUNTER — Ambulatory Visit: Payer: BC Managed Care – PPO | Admitting: Physician Assistant

## 2023-01-23 NOTE — Patient Instructions (Signed)
Your next SIMPONI SQ dose is due on 02/23/2023, 03/23/2023, and every 4 weeks thereafter  CONTINUE methotrexate 15mg  (6 tabs) by mouth once weekly (with folic acid 2mg  daily). CONTINUE hydroxychloroquine 200mg  (1 tab) in morning and 100 mg (half-tablet) in evenings  HOLD SIMPONI SQ and METHOTREXATE if you have signs or symptoms of an infection. You can resume once you feel better or back to your baseline. HOLD SIMPONI SQ and METHOTREXATE if you start antibiotics to treat an infection. HOLD SIMPONI SQ and METHOTREXATE around the time of surgery/procedures. Your surgeon will be able to provide recommendations on when to hold BEFORE and when you are cleared to RESUME.  Pharmacy information: Your prescription will be shipped from [facility]. Their phone number is [phone] Please call to schedule shipment and confirm address. They will mail your medication to your home.  Cost information: Your copay should be affordable. If you call the pharmacy and it is not affordable, please double-check that they are billing through your copay card as secondary coverage. That copay card information is: BIN: [id number] Group: [id number] ID: [id number] NO PCN Copay card phone: [phone]  Labs are due in 1 month then every 3 months. Lab hours are from Monday to Thursday 8am-12:30pm and 1pm-5pm and Friday 8am-12pm. You do not need an appointment if you come for labs during these times. If you'd like to go to a [facility] or [facility] closer to home, please call our clinic 48 hours prior to lab date so we can release orders in a timely manner.  How to manage an injection site reaction: Remember the 5 C's: COUNTER - leave on the counter at least 30 minutes but up to overnight to bring medication to room temperature. This may help prevent stinging COLD - place something cold (like an ice gel pack or cold water bottle) on the injection site just before cleansing with alcohol. This may help reduce  pain CLARITIN - use Claritin (generic name is loratadine) for the first two weeks of treatment or the day of, the day before, and the day after injecting. This will help to minimize injection site reactions CORTISONE CREAM - apply if injection site is irritated and itching CALL ME - if injection site reaction is bigger than the size of your fist, looks infected, blisters, or if you develop hives

## 2023-01-23 NOTE — Progress Notes (Unsigned)
Pharmacy Note  Subjective:   Patient presents to clinic today to receive first dose of Simponi for PsA + PsO. Patient currently takes methotrexate 15 mg p.o. once weekly with folic acid 2 mg daily AND hydroxychloroquine 200mg  in morning and 100 mg in evenings. She started Taltz on 11/27/2022 but experienced significant GI side effects (her last dose was on 12/25/2022). She states that GI side effects has pretty much resolved since her last dose of Taltz.  Patient running a fever or have signs/symptoms of infection? No  Patient currently on antibiotics for the treatment of infection? No  Patient have any upcoming invasive procedures/surgeries? No  Objective: CMP     Component Value Date/Time   NA 136 01/07/2023 0840   NA 142 09/29/2013 0901   K 4.3 01/07/2023 0840   CL 97 (L) 01/07/2023 0840   CO2 30 01/07/2023 0840   GLUCOSE 55 (L) 01/07/2023 0840   BUN 6 (L) 01/07/2023 0840   BUN 9 09/29/2013 0901   CREATININE 0.59 01/07/2023 0840   CALCIUM 9.3 01/07/2023 0840   PROT 6.8 01/07/2023 0840   PROT 6.5 09/29/2013 0901   ALBUMIN 4.3 10/29/2016 1140   ALBUMIN 4.4 09/29/2013 0901   AST 23 01/07/2023 0840   ALT 15 01/07/2023 0840   ALKPHOS 113 10/29/2016 1140   BILITOT 0.4 01/07/2023 0840   GFRNONAA 110 01/10/2019 1258   GFRAA 128 01/10/2019 1258    CBC    Component Value Date/Time   WBC 6.0 01/07/2023 0840   RBC 3.89 01/07/2023 0840   HGB 12.1 01/07/2023 0840   HCT 37.2 01/07/2023 0840   PLT 241 01/07/2023 0840   MCV 95.6 01/07/2023 0840   MCH 31.1 01/07/2023 0840   MCHC 32.5 01/07/2023 0840   RDW 11.9 01/07/2023 0840   RDW 17.1 (H) 09/29/2013 0901   LYMPHSABS 2,184 01/07/2023 0840   LYMPHSABS 1.3 09/29/2013 0901   MONOABS 540 10/29/2016 1140   EOSABS 198 01/07/2023 0840   EOSABS 0.0 09/29/2013 0901   BASOSABS 18 01/07/2023 0840   BASOSABS 0.0 09/29/2013 0901    Baseline Immunosuppressant Therapy Labs TB GOLD    Latest Ref Rng & Units 11/19/2022    3:33 PM   Quantiferon TB Gold  Quantiferon TB Gold Plus NEGATIVE NEGATIVE    Hepatitis Panel    Latest Ref Rng & Units 11/19/2022    3:33 PM  Hepatitis  Hep B Surface Ag NON-REACTIVE NON-REACTIVE   Hep B IgM NON-REACTIVE NON-REACTIVE   Hep C Ab NON-REACTIVE NON-REACTIVE    HIV No results found for: "HIV"  Immunoglobulins    Latest Ref Rng & Units 11/19/2022    3:33 PM  Immunoglobulin Electrophoresis  IgA  47 - 310 mg/dL 604   IgG 540 - 9,811 mg/dL 9,147   IgM 50 - 829 mg/dL 82    SPEP    Latest Ref Rng & Units 01/07/2023    8:40 AM  Serum Protein Electrophoresis  Total Protein 6.1 - 8.1 g/dL 6.8    Chest x-ray: 5/62/1308 - No active cardiopulmonary disease.  Assessment/Plan:  Reviewed importance of holding SIMPONI with signs/symptoms of an infections, if antibiotics are prescribed to treat an active infection, and with invasive procedures  Demonstrated proper injection technique with [facility] autoinjector demo device  Patient able to demonstrate proper injection technique using the teach back method.  Patient self injected in the right lower abdomen with:  Sample Medication: Simponi 50mg /0.49mL autoinjector pen NDC: [phone] Lot: [id number] Expiration: 12/2024  Patient tolerated  well.  Observed for 30 mins in office for adverse reaction. Patient denies itchiness and irritation at injection., No swelling or redness noted., and Reviewed injection site reaction management with patient verbally and printed information for review in AVS  Patient is to return in 1 month for labs and 6-8 weeks for follow-up appointment.  Standing orders for CBC/CMP remain in place.  TB gold will be monitored yearly.  She is already established with [facility] for psoriasis management.  Simponi SQ approved through insurance .   Rx sent to: [facility]: [id number].  Patient provided with pharmacy phone number and advised to call next week to schedule shipment to  home.  Patient will continue Simponi 50mg  subcut every 28 days Continue methotrexate 15mg  p.o. once weekly (with folic acid 2mg  daily). Continue hydroxychloroquine 200mg  in morning and 100 mg in evenings.  All questions encouraged and answered.  Instructed patient to call with any further questions or concerns.  [name], PharmD, MPH, BCPS, CPP Clinical Pharmacist (Rheumatology and Pulmonology)  01/23/2023 9:26 AM

## 2023-01-26 ENCOUNTER — Ambulatory Visit: Payer: BC Managed Care – PPO | Attending: Rheumatology | Admitting: Pharmacist

## 2023-01-26 DIAGNOSIS — Z7189 Other specified counseling: Secondary | ICD-10-CM

## 2023-01-26 DIAGNOSIS — L4050 Arthropathic psoriasis, unspecified: Secondary | ICD-10-CM

## 2023-01-26 DIAGNOSIS — L409 Psoriasis, unspecified: Secondary | ICD-10-CM

## 2023-01-26 DIAGNOSIS — Z79899 Other long term (current) drug therapy: Secondary | ICD-10-CM

## 2023-01-26 MED ORDER — SIMPONI 50 MG/0.5ML ~~LOC~~ SOAJ
50.0000 mg | SUBCUTANEOUS | 2 refills | Status: DC
Start: 2023-01-26 — End: 2023-02-18

## 2023-01-27 ENCOUNTER — Telehealth: Payer: Self-pay

## 2023-01-27 NOTE — Telephone Encounter (Signed)
Patient contacted the office and states she took her Simponi shot yesterday, Monday morning, and she has went to her Doctor's office. Patient states her head had starting pounding. Patient states they took her blood pressure yesterday around lunch time and it was 160/100. Patient states she then took her blood pressure at mid evening yesterday and it was 161/99. Patient states she then took her blood pressure again at dinner time and it was 140/92. Patient states she took her blood pressure at lunch time today and it was 157/103. Patient states the Simponi injection is the only new thing she has done. Patient inquires if the Simponi may be the reason for her spike in blood pressure. Please advise.

## 2023-01-27 NOTE — Telephone Encounter (Signed)
BP was 151/90 while she was in the office on 01/07/23 prior to starting on Simponi.  An elevation of BP is not a common side effect of simponi.   Recommend following up with PCP for further evaluation.

## 2023-01-27 NOTE — Telephone Encounter (Signed)
Attempted to contact the patient and left message for patient to call the office.  

## 2023-01-28 NOTE — Telephone Encounter (Signed)
Patient advised BP was 151/90 while she was in the office on 01/07/23 prior to starting on Simponi. An elevation of BP is not a common side effect of simponi. Recommend following up with PCP for further evaluation. Patient verbalized understanding.

## 2023-01-28 NOTE — Telephone Encounter (Signed)
Attempted to contact the patient and left message for patient to call the office.  

## 2023-02-04 ENCOUNTER — Telehealth: Payer: Self-pay | Admitting: Pharmacist

## 2023-02-04 NOTE — Telephone Encounter (Signed)
Spoke with Michelle [name], at [facility] regarding patient's Simponi rx DURs  DUR # 1 : latex allergy - patient administered first dose in clinic and tolerated well. No issues with reaction to cap in clinic and patient did not call after regarding localized injection reaction DUR # 2 : lupus (drug-disease interactions) - advised that patient's history of SLE was historical and we do not see rheumatologic evidence of SLE. Minimal concern for this drug-disease intxn at this time  [name], PharmD, MPH, BCPS, CPP Clinical Pharmacist (Rheumatology and Pulmonology)

## 2023-02-13 ENCOUNTER — Telehealth: Payer: Self-pay | Admitting: *Deleted

## 2023-02-13 NOTE — Telephone Encounter (Signed)
Ok to discontinue simponi if she thinks that is the cause of blood pressure fluctuations.  Please schedule sooner visit to discuss treatment options.

## 2023-02-13 NOTE — Telephone Encounter (Signed)
Patient contacted the office stating she has been having trouble with her blood pressure since starting the Simponi. Patient states she has been having trouble with it "yo-yoing". Patient states she has had episodes where is was elevated and then last night it bottomed out. Patient states she has seen her PCP several times and had to call the ambulance last night. Patient states the only change she has had is the Simponi. Patient states she has also broken out into hives. Patient states she is due for her next dose on 02/21/2023. Patient states she does not think she should take it. Please advise.

## 2023-02-13 NOTE — Telephone Encounter (Signed)
Patient advised ok to discontinue simponi if she thinks that is the cause of blood pressure fluctuations. Patient scheduled to be seen 02/18/2023 to discuss treatment options.

## 2023-02-16 NOTE — Progress Notes (Unsigned)
Office Visit Note  Patient: Michelle [name]             Date of Birth: 1976/01/21           MRN: [id number]             PCP: Lester Gholson., MD Referring: Lester Bellevue., MD Visit Date: 02/18/2023 Occupation: @GUAROCC @  Subjective:  No chief complaint on file.   History of Present Illness: Michelle [name] is a 47 y.o. female ***     Activities of Daily Living:  Patient reports morning stiffness for *** {minute/hour:19697}.   Patient {ACTIONS;DENIES/REPORTS:21021675::"Denies"} nocturnal pain.  Difficulty dressing/grooming: {ACTIONS;DENIES/REPORTS:21021675::"Denies"} Difficulty climbing stairs: {ACTIONS;DENIES/REPORTS:21021675::"Denies"} Difficulty getting out of chair: {ACTIONS;DENIES/REPORTS:21021675::"Denies"} Difficulty using hands for taps, buttons, cutlery, and/or writing: {ACTIONS;DENIES/REPORTS:21021675::"Denies"}  No Rheumatology ROS completed.   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   Psoriatic arthropathy (HCC) 04/17/2016   Psoriasis 04/17/2016   ANA positive 04/17/2016   Other fatigue 04/17/2016   DDD (degenerative disc disease), cervical 04/17/2016   DDD (degenerative disc disease), lumbar 04/17/2016   History of diabetes mellitus 04/17/2016   History of migraine 04/17/2016   Fatty liver 04/17/2016   Diabetes (HCC)    Depression    Fibromyalgia    Muscle cramps     Past Medical History:  Diagnosis Date   Depression    Diabetes (HCC)    Fibromyalgia    Migraine    Muscle cramps    Psoriasis     Family History  Problem Relation Age of Onset   Cancer Mother    Cancer Father    Diabetes Sister    Cancer Brother    Past  Surgical History:  Procedure Laterality Date   ABDOMINAL HYSTERECTOMY     CESAREAN SECTION     CHOLECYSTECTOMY     FACET JOINT INJECTION     GASTRIC BYPASS     radiofrequency injections     neck, pain management did injections   SI Joint Injection     tummy tuck  03/24/2018   Social History   Social History Narrative   Patient lives at home alone with her husband and she works full time disability .    Right handed.   Caffeine one times per day.   Immunization History  Administered Date(s) Administered   Influenza Inj Mdck Quad Pf 02/13/2017   Influenza,inj,Quad PF,6+ Mos 01/23/2016, 01/27/2018   Influenza-Unspecified 01/23/2016   Moderna Sars-Covid-2 Vaccination 04/17/2019, 05/21/2019, 07/24/2020   Pneumococcal Polysaccharide-23 01/20/2012   Tdap 07/28/2012     Objective: Vital Signs: There were no vitals taken for this visit.   Physical Exam   Musculoskeletal Exam: ***  CDAI Exam: CDAI Score: -- Patient Global: --; Provider Global: -- Swollen: --; Tender: -- Joint Exam 02/18/2023   No joint exam has been documented for this visit   There is currently no information documented on the homunculus. Go to the Rheumatology activity and complete the homunculus joint exam.  Investigation: No additional findings.  Imaging: No results found.  Recent Labs: Lab Results  Component Value Date   WBC 6.0 01/07/2023   HGB 12.1 01/07/2023   PLT 241 01/07/2023   NA 136  01/07/2023   K 4.3 01/07/2023   CL 97 (L) 01/07/2023   CO2 30 01/07/2023   GLUCOSE 55 (L) 01/07/2023   BUN 6 (L) 01/07/2023   CREATININE 0.59 01/07/2023   BILITOT 0.4 01/07/2023   ALKPHOS 113 10/29/2016   AST 23 01/07/2023   ALT 15 01/07/2023   PROT 6.8 01/07/2023   ALBUMIN 4.3 10/29/2016   CALCIUM 9.3 01/07/2023   GFRAA 128 01/10/2019   QFTBGOLDPLUS NEGATIVE 11/19/2022    Speciality Comments: PLQ Eye Exam:  04/09/2022 WNL at [facility]. Follow up 1 year.   Enbrel stopped 11/17/22;  Taltz started 11/27/22 MTX + plaquenil in combination with biologics; Taltz stopped 12/25/22 -> Simponi SQ started 01/26/23  Procedures:  No procedures performed Allergies: Zoledronic acid, Duloxetine, Flunisolide, Vilazodone, Latex, and Tape   Assessment / Plan:     Visit Diagnoses: No diagnosis found.  Orders: No orders of the defined types were placed in this encounter.  No orders of the defined types were placed in this encounter.   Face-to-face time spent with patient was *** minutes. Greater than 50% of time was spent in counseling and coordination of care.  Follow-Up Instructions: No follow-ups on file.   Ellen Henri, CMA  Note - This record has been created using Animal nutritionist.  Chart creation errors have been sought, but may not always  have been located. Such creation errors do not reflect on  the standard of medical care.

## 2023-02-18 ENCOUNTER — Encounter: Payer: Self-pay | Admitting: Physician Assistant

## 2023-02-18 ENCOUNTER — Telehealth: Payer: Self-pay | Admitting: Pharmacist

## 2023-02-18 ENCOUNTER — Ambulatory Visit: Payer: BC Managed Care – PPO | Attending: Physician Assistant | Admitting: Physician Assistant

## 2023-02-18 VITALS — BP 132/85 | HR 85 | Resp 14 | Ht 64.0 in | Wt 143.0 lb

## 2023-02-18 DIAGNOSIS — L4050 Arthropathic psoriasis, unspecified: Secondary | ICD-10-CM

## 2023-02-18 DIAGNOSIS — Z8659 Personal history of other mental and behavioral disorders: Secondary | ICD-10-CM

## 2023-02-18 DIAGNOSIS — M47816 Spondylosis without myelopathy or radiculopathy, lumbar region: Secondary | ICD-10-CM | POA: Diagnosis not present

## 2023-02-18 DIAGNOSIS — M5030 Other cervical disc degeneration, unspecified cervical region: Secondary | ICD-10-CM

## 2023-02-18 DIAGNOSIS — L409 Psoriasis, unspecified: Secondary | ICD-10-CM

## 2023-02-18 DIAGNOSIS — Z8639 Personal history of other endocrine, nutritional and metabolic disease: Secondary | ICD-10-CM

## 2023-02-18 DIAGNOSIS — Z79899 Other long term (current) drug therapy: Secondary | ICD-10-CM

## 2023-02-18 DIAGNOSIS — M62838 Other muscle spasm: Secondary | ICD-10-CM

## 2023-02-18 DIAGNOSIS — M8589 Other specified disorders of bone density and structure, multiple sites: Secondary | ICD-10-CM

## 2023-02-18 DIAGNOSIS — Z8709 Personal history of other diseases of the respiratory system: Secondary | ICD-10-CM

## 2023-02-18 DIAGNOSIS — R5383 Other fatigue: Secondary | ICD-10-CM

## 2023-02-18 DIAGNOSIS — Z9884 Bariatric surgery status: Secondary | ICD-10-CM

## 2023-02-18 DIAGNOSIS — S92414D Nondisplaced fracture of proximal phalanx of right great toe, subsequent encounter for fracture with routine healing: Secondary | ICD-10-CM

## 2023-02-18 DIAGNOSIS — N3010 Interstitial cystitis (chronic) without hematuria: Secondary | ICD-10-CM

## 2023-02-18 DIAGNOSIS — M797 Fibromyalgia: Secondary | ICD-10-CM

## 2023-02-18 NOTE — Patient Instructions (Addendum)
Upadacitinib Extended-Release Tablets What is this medication? UPADACITINIB (ue PAD a SYE ti nib) treats autoimmune conditions, such as arthritis, eczema, and ulcerative colitis. It is often used when other medications have not worked well enough or cannot be tolerated. It works by slowing down an overactive immune system. This decreases inflammation. This medicine may be used for other purposes; ask your health care provider or pharmacist if you have questions. COMMON BRAND NAME(S): RINVOQ What should I tell my care team before I take this medication? They need to know if you have any of these conditions: Blood clots Cancer Current or past tobacco use Diabetes Have had a heart attack or stroke Heart disease HIV or AIDs Immune system problems Infection or have had an infection that does not go away, such as tuberculosis (TB), shingles, or other bacterial, fungal, or viral infections Kidney disease Live or have traveled to the [facility] [place] or the [place] or [place] Liver disease, such as hepatitis Low blood cell levels (white cells, red cells, and platelets) Lung or breathing disease, such as asthma or COPD Recent or upcoming vaccine Stomach or intestine problems Taking NSAIDs, medications for pain and inflammation, such as ibuprofen or naproxen Taking steroid medications, such as prednisone or cortisone An unusual or allergic reaction to upadacitinib, other medications, foods, dyes, or preservatives Pregnant or trying to get pregnant Breastfeeding How should I use this medication? Take this medication by mouth with water. Take it as directed on the prescription label at the same time every day. Do not cut, crush, or chew this medication. Swallow the tablets whole. You can take it with or without food. If it upsets your stomach, take it with food. Keep taking it unless your care team tells you to stop. Do not take this medication with grapefruit juice. A special  MedGuide will be given to you by the pharmacist with each prescription and refill. Be sure to read this information carefully each time. Talk to your care team about the use of this medication in children. While it may be prescribed for children as young as 2 years for selected conditions, precautions do apply. Overdosage: If you think you have taken too much of this medicine contact a poison control center or emergency room at once. NOTE: This medicine is only for you. Do not share this medicine with others. What if I miss a dose? If you miss a dose, take it as soon as you can. If it is almost time for your next dose, take only that dose. Do not take double or extra doses. What may interact with this medication? Certain antivirals for HIV or hepatitis Certain medications for fungal infections, such as ketoconazole, itraconazole, posaconazole, voriconazole Certain medications for seizures, such as carbamazepine, phenobarbital, phenytoin Clarithromycin Grapefruit and grapefruit juice Live virus vaccines Medications that lower your chance of fighting infection, such as abatacept, adalimumab, azathioprine, cyclosporine, etanercept, infliximab, rituximab Rifampin Supplements, such as St. John's wort Other medications may affect the way this medication works. Talk with your care team about all of the medications you take. They may suggest changes to your treatment plan to lower the risk of side effects and to make sure your medications work as intended. This list may not describe all possible interactions. Give your health care provider a list of all the medicines, herbs, non-prescription drugs, or dietary supplements you use. Also tell them if you smoke, drink alcohol, or use illegal drugs. Some items may interact with your medicine. What should I watch for  while using this medication? Visit your care team for regular checks on your progress. Tell your care team if your symptoms do not start to get  better or if they get worse. You may need blood work done while you are taking this medication. This medication may increase your risk of getting an infection. Call your care team for advice if you get a fever, chills, sore throat, or other symptoms of a cold or flu. Do not treat yourself. Try to avoid being around people who are sick. Your care team will screen you for tuberculosis (TB) before you start this medication. If they think you are at risk, you may be treated with medication for TB. You should start taking the medication for TB before you start this medication. Make sure to finish the full course of TB medication. Avoid taking medications that contain aspirin, acetaminophen, ibuprofen, naproxen, or ketoprofen unless instructed by your care team. Talk to your care team about your risk of cancer. You may be more at risk for certain types of cancer if you take this medication. This medication can make you more sensitive to the sun. Keep out of the sun. If you cannot avoid being in the sun, wear protective clothing and sunscreen. Do not use sun lamps, tanning beds, or tanning booths. Tell your care team right away if you have any change in your eyesight. Talk to your care team if you often see part of the tablet in your stool. Talk to your care team if you may be pregnant. Serious birth defects can occur if you take this medication during pregnancy and for 4 weeks after the last dose. You will need a negative pregnancy test before starting this medication. Contraception is recommended while taking this medication and for 4 weeks after the last dose. Your care team can help you find the option that works for you. Do not breastfeed while taking this medication and for 6 days after the last dose. What side effects may I notice from receiving this medication? Side effects that you should report to your care team as soon as possible: Allergic reactions--skin rash, itching, hives, swelling of the face,  lips, tongue, or throat Blood clot--pain, swelling, or warmth in the leg, shortness of breath, chest pain Change in vision Heart attack--pain or tightness in the chest, shoulders, arms, or jaw, nausea, shortness of breath, cold or clammy skin, feeling faint or lightheaded Infection--fever, chills, cough, sore throat, wounds that don't heal, pain or trouble when passing urine, general feeling of discomfort or being unwell Liver injury--right upper belly pain, loss of appetite, nausea, light-colored stool, dark yellow or brown urine, yellowing skin or eyes, unusual weakness or fatigue Low red blood cell level--unusual weakness or fatigue, dizziness, headache, trouble breathing Stomach pain that is severe, does not go away, or gets worse Stroke--sudden numbness or weakness of the face, arm, or leg, trouble speaking, confusion, trouble walking, loss of balance or coordination, dizziness, severe headache, change in vision Side effects that usually do not require medical attention (report these to your care team if they continue or are bothersome): Acne Cough Headache Nausea Runny or stuffy nose This list may not describe all possible side effects. Call your doctor for medical advice about side effects. You may report side effects to FDA at 1-800-FDA-1088. Where should I keep my medication? Keep out of the reach of children and pets. Store at room temperature between 20 and 25 degrees C (68 and 77 degrees F). Protect from moisture. Keep  the container tightly closed. Keep this medication in the original container until you are ready to take it. Get rid of any unused medication after the expiration date. To get rid of medications that are no longer needed or have expired: Take the medication to a medication take-back program. Check with your pharmacy or law enforcement to find a location. If you cannot return the medication, check the label or package insert to see if the medication should be thrown out  in the garbage or flushed down the toilet. If you are not sure, ask your care team. If it is safe to put it in the trash, empty the medication out of the container. Mix the medication with cat litter, dirt, coffee grounds, or other unwanted substance. Seal the mixture in a bag or container. Put it in the trash. NOTE: This sheet is a summary. It may not cover all possible information. If you have questions about this medicine, talk to your doctor, pharmacist, or health care provider.  2024 Elsevier/Gold Standard (2022-08-25 00:00:00)  Standing Labs We placed an order today for your standing lab work.   Please have your standing labs drawn in 1 month then every 3 months  Please have your labs drawn 2 weeks prior to your appointment so that the provider can discuss your lab results at your appointment, if possible.  Please note that you may see your imaging and lab results in MyChart before we have reviewed them. We will contact you once all results are reviewed. Please allow our office up to 72 hours to thoroughly review all of the results before contacting the office for clarification of your results.  WALK-IN LAB HOURS  Monday through Thursday from 8:00 am -12:30 pm and 1:00 pm-5:00 pm and Friday from 8:00 am-12:00 pm.  Patients with office visits requiring labs will be seen before walk-in labs.  You may encounter longer than normal wait times. Please allow additional time. Wait times may be shorter on  Monday and Thursday afternoons.  We do not book appointments for walk-in labs. We appreciate your patience and understanding with our staff.   Labs are drawn by [facility]. Please bring your co-pay at the time of your lab draw.  You may receive a bill from [facility] for your lab work.  Please note if you are on Hydroxychloroquine and and an order has been placed for a Hydroxychloroquine level,  you will need to have it drawn 4 hours or more after your last dose.  If you wish to have your labs drawn  at another location, please call the office 24 hours in advance so we can fax the orders.  The office is located at [address], [place], [place] [address]   If you have any questions regarding directions or hours of operation,  please call [phone].   As a reminder, please drink plenty of water prior to coming for your lab work. Thanks!  Vaccines You are taking a medication(s) that can suppress your immune system.  The following immunizations are recommended: Flu annually Covid-19  Td/Tdap (tetanus, diphtheria, pertussis) every 10 years Pneumonia (Prevnar 15 then Pneumovax 23 at least 1 year apart.  Alternatively, can take Prevnar 20 without needing additional dose) Shingrix: 2 doses from 4 weeks to 6 months apart  Please check with your PCP to make sure you are up to date.

## 2023-02-18 NOTE — Telephone Encounter (Signed)
Pending OV note from today, please start Rinvoq BIV  Dose: 15mg  po once daily  Previous therapy include: Humira (waning response) Enbrel (stopped 11/17/22) Altamease Oiler started 11/27/22 (stopped 12/25/22 due to diarrhea) Simponi SQ started 01/26/23 (rash and BP elevation)  She continues on hydroxychloroquine 200mg  in morning and 100mg  in evenings. She also takes MTX 15mg  po once weekly with folic acid 2mg  daily  [name], PharmD, MPH, BCPS, CPP Clinical Pharmacist (Rheumatology and Pulmonology)

## 2023-02-18 NOTE — Progress Notes (Signed)
Pharmacy Note  Subjective: Patient presents today to [facility] for follow up office visit. Patient seen by the pharmacist for counseling on Rinvoq for rheumatoid arthritis..  Previous therapy include: Humira (waning response) -> Enbrel (stopped 11/17/22); Taltz started 11/27/22 (stopped 12/25/22 due to diarrhea) -> Simponi SQ started 01/26/23 (rash and BP elevation)  She continues on hydroxychloroquine 200mg  in morning and 100mg  in evenings. She also takes MTX 15mg  po once weekly with folic acid 2mg  daily  History of diverticulitis:  No  History of MI, stroke, or CV events:  No  Objective:  CMP     Component Value Date/Time   NA 136 01/07/2023 0840   NA 142 09/29/2013 0901   K 4.3 01/07/2023 0840   CL 97 (L) 01/07/2023 0840   CO2 30 01/07/2023 0840   GLUCOSE 55 (L) 01/07/2023 0840   BUN 6 (L) 01/07/2023 0840   BUN 9 09/29/2013 0901   CREATININE 0.59 01/07/2023 0840   CALCIUM 9.3 01/07/2023 0840   PROT 6.8 01/07/2023 0840   PROT 6.5 09/29/2013 0901   ALBUMIN 4.3 10/29/2016 1140   ALBUMIN 4.4 09/29/2013 0901   AST 23 01/07/2023 0840   ALT 15 01/07/2023 0840   ALKPHOS 113 10/29/2016 1140    CBC    Component Value Date/Time   WBC 6.0 01/07/2023 0840   RBC 3.89 01/07/2023 0840   HGB 12.1 01/07/2023 0840   HCT 37.2 01/07/2023 0840   PLT 241 01/07/2023 0840   MCV 95.6 01/07/2023 0840   MCH 31.1 01/07/2023 0840   MCHC 32.5 01/07/2023 0840   RDW 11.9 01/07/2023 0840   RDW 17.1 (H) 09/29/2013 0901    Baseline Immunosuppressant Therapy Labs TB GOLD    Latest Ref Rng & Units 11/19/2022    3:33 PM  Quantiferon TB Gold  Quantiferon TB Gold Plus NEGATIVE NEGATIVE    Hepatitis Panel    Latest Ref Rng & Units 11/19/2022    3:33 PM  Hepatitis  Hep B Surface Ag NON-REACTIVE NON-REACTIVE   Hep B IgM NON-REACTIVE NON-REACTIVE   Hep C Ab NON-REACTIVE NON-REACTIVE    HIV No results found for: "HIV" Immunoglobulins    Latest Ref Rng & Units 11/19/2022    3:33 PM   Immunoglobulin Electrophoresis  IgA  47 - 310 mg/dL 811   IgG 914 - 7,829 mg/dL 5,621   IgM 50 - 308 mg/dL 82    SPEP    Latest Ref Rng & Units 01/07/2023    8:40 AM  Serum Protein Electrophoresis  Total Protein 6.1 - 8.1 g/dL 6.8    M5HQ No results found for: "G6PDH" TPMT No results found for: "TPMT"   Lipid Panel No results found for: "CHOL", "HDL", "LDLCALC", "LDLDIRECT", "TRIG", "CHOLHDL"   Assessment/Plan:  Counseled patient that Rinvoq is a JAK inhibitor indicated for Rheumatoid Arthritis.  Counseled patient on purpose, proper use, and adverse effects of Rinvoq.    Reviewed the most common adverse effects including infection, diarrhea, headaches.  Also reviewed rare adverse effects such as bowel injury and the need to contact us if they develop stomach pain during treatment. Counseled on the increase risk of venous thrombosis. Counseled about FDA black box warning of MACE (major adverse CV events including cardiovascular death, myocardial infarction, and stroke).  Reviewed with patient that there is the possibility of an increased risk of malignancy specifically lung cancer and lymphomas but it is not well understood if this increased risk is due to the medication or the disease  state. Instructed patient that medication should be held for infection and prior to surgery.  Advised patient to avoid live vaccines. Recommend annual influenza, PCV 15 or PCV20 or Pneumovax 23, and Shingrix as indicated.  She plans to get first Shingles vaccine before she starts Rinvoq.  Reviewed importance of routine lab monitoring including lipid panel.  Will recheck lipid panel 3 months after starting and annually thereafter. CBC and CMP will be monitored routinely every 3 months. Standing orders placed. Provided patient with medication education material and answered all questions.  Patient consented to Rinvoq.  Will upload into patient's chart.  Will apply through patient's insurance and update when we  receive a response.    Patient dose will be Rinvoq 15 mg po daily.  Prescription will be sent to pharmacy pending insurance approval.   She continues on hydroxychloroquine 200mg  in morning and 100mg  in evenings. She also takes MTX 15mg  po once weekly with folic acid 2mg  daily  [name], PharmD, MPH, BCPS, CPP Clinical Pharmacist (Rheumatology and Pulmonology)

## 2023-02-19 NOTE — Telephone Encounter (Signed)
Submitted a Prior Authorization request to [employer] for [facility] via CoverMyMeds. Will update once we receive a response.  Key: [id number]  [name], PharmD, MPH, BCPS, CPP Clinical Pharmacist (Rheumatology and Pulmonology)

## 2023-02-25 NOTE — Telephone Encounter (Signed)
Received notification from [facility] regarding a prior authorization for [facility]. Authorization has been APPROVED from 01/20/2023 to 08/24/2023.  Patient must fill through [facility]: [id number]  Authorization # [id number]  Pending Rinvoq copay card to populate in Complete Pro portal.  [name], PharmD, MPH, BCPS, CPP Clinical Pharmacist (Rheumatology and Pulmonology)

## 2023-02-25 NOTE — Progress Notes (Deleted)
Office Visit Note  Patient: Michelle [name]             Date of Birth: 1976-03-11           MRN: [id number]             PCP: Lester Savona., MD Referring: Lester Warrenton., MD Visit Date: 03/11/2023 Occupation: @GUAROCC @  Subjective:  Medication monitoring   History of Present Illness: Michelle [name] is a 47 y.o. female with history of psoriatic arthritis.  Patient was started on Rinvoq 15 mg 1 tablet by mouth daily after her last office visit.  She has remained on Methotrexate 6 tabs po once weekly, folic acid 2 mg daily, Plaquenil 200 mg 1 tab in the morning and half tablet in the evening.     Rinvoq 15 mg 1 tablet by mouth daily.  She will remain on Methotrexate 6 tabs po once weekly, folic acid 2 mg daily, Plaquenil 200 mg 1 tab in the morning and half tablet in the evening.  CBC updated on 02/12/23 and CMP 01/29/23. TB gold negative on 11/19/22.  Simponi was started on 01/26/23-plan to discontinue due to labile blood pressure and hives.  PLQ Eye Exam: 04/09/2022 WNL at [facility]. Follow up 1 year.  TB gold negative 11/19/22. Previous therapy includes: Enbrel and Humira. Taltz discontinued 12/25/22-diarrhea and abdominal pain.  Patient is planning to receive first shingrix vaccine prior to starting rinvoq.    Activities of Daily Living:  Patient reports morning stiffness for *** {minute/hour:19697}.   Patient {ACTIONS;DENIES/REPORTS:21021675::"Denies"} nocturnal pain.  Difficulty dressing/grooming: {ACTIONS;DENIES/REPORTS:21021675::"Denies"} Difficulty climbing stairs: {ACTIONS;DENIES/REPORTS:21021675::"Denies"} Difficulty getting out of chair: {ACTIONS;DENIES/REPORTS:21021675::"Denies"} Difficulty using hands for taps, buttons, cutlery, and/or writing: {ACTIONS;DENIES/REPORTS:21021675::"Denies"}  No Rheumatology ROS completed.   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   Psoriatic arthropathy (HCC) 04/17/2016   Psoriasis 04/17/2016   ANA positive 04/17/2016   Other fatigue 04/17/2016   DDD (degenerative disc disease), cervical 04/17/2016   DDD (degenerative disc disease), lumbar 04/17/2016   History of diabetes mellitus 04/17/2016   History of migraine 04/17/2016   Fatty liver 04/17/2016   Diabetes (HCC)    Depression    Fibromyalgia    Muscle cramps     Past Medical History:  Diagnosis Date   Depression    Diabetes (HCC)    Fibromyalgia    Migraine    Muscle cramps    Psoriasis     Family History  Problem Relation Age of Onset   Cancer Mother    Cancer Father    Diabetes Sister    Cancer Brother    Past Surgical History:  Procedure Laterality Date   ABDOMINAL HYSTERECTOMY     CESAREAN SECTION     CHOLECYSTECTOMY     FACET JOINT INJECTION     GASTRIC BYPASS     radiofrequency injections     neck, pain management did injections   SI Joint Injection     tummy tuck  03/24/2018   Social History   Social History Narrative   Patient lives at home alone with her husband and she works full time disability .    Right handed.   Caffeine  one times per day.   Immunization History  Administered Date(s) Administered   Influenza Inj Mdck Quad Pf 02/13/2017   Influenza,inj,Quad PF,6+ Mos 01/23/2016, 01/27/2018   Influenza-Unspecified 01/23/2016   Moderna Sars-Covid-2 Vaccination 04/17/2019, 05/21/2019, 07/24/2020   Pneumococcal Polysaccharide-23 01/20/2012   Tdap 07/28/2012     Objective: Vital Signs: There were no vitals taken for this visit.   Physical Exam Vitals and nursing note reviewed.  Constitutional:      Appearance: She is well-developed.  HENT:     Head: Normocephalic and atraumatic.   Eyes:     Conjunctiva/sclera: Conjunctivae normal.  Cardiovascular:     Rate and Rhythm: Normal rate and regular rhythm.     Heart sounds: Normal heart sounds.  Pulmonary:     Effort: Pulmonary effort is normal.     Breath sounds: Normal breath sounds.  Abdominal:     General: Bowel sounds are normal.     Palpations: Abdomen is soft.  Musculoskeletal:     Cervical back: Normal range of motion.  Lymphadenopathy:     Cervical: No cervical adenopathy.  Skin:    General: Skin is warm and dry.     Capillary Refill: Capillary refill takes less than 2 seconds.  Neurological:     Mental Status: She is alert and oriented to person, place, and time.  Psychiatric:        Behavior: Behavior normal.      Musculoskeletal Exam: ***  CDAI Exam: CDAI Score: -- Patient Global: --; Provider Global: -- Swollen: --; Tender: -- Joint Exam 03/11/2023   No joint exam has been documented for this visit   There is currently no information documented on the homunculus. Go to the Rheumatology activity and complete the homunculus joint exam.  Investigation: No additional findings.  Imaging: No results found.  Recent Labs: Lab Results  Component Value Date   WBC 6.0 01/07/2023   HGB 12.1 01/07/2023   PLT 241 01/07/2023   NA 136 01/07/2023   K 4.3 01/07/2023   CL 97 (L) 01/07/2023   CO2 30 01/07/2023   GLUCOSE 55 (L) 01/07/2023   BUN 6 (L) 01/07/2023   CREATININE 0.59 01/07/2023   BILITOT 0.4 01/07/2023   ALKPHOS 113 10/29/2016   AST 23 01/07/2023   ALT 15 01/07/2023   PROT 6.8 01/07/2023   ALBUMIN 4.3 10/29/2016   CALCIUM 9.3 01/07/2023   GFRAA 128 01/10/2019   QFTBGOLDPLUS NEGATIVE 11/19/2022    Speciality Comments: PLQ Eye Exam:  04/09/2022 WNL at [facility]. Follow up 1 year.  MTX + plaquenil in combination with biologics  Humira (waning response) -> Enbrel (stopped 11/17/22); Taltz started 11/27/22 (stopped 12/25/22 due to diarrhea) -> Simponi SQ started 01/26/23  (rash and BP elevation) -> Rinvoq  Procedures:  No procedures performed Allergies: Zoledronic acid, Duloxetine, Flunisolide, Vilazodone, Latex, and Tape   Assessment / Plan:     Visit Diagnoses: No diagnosis found.  Orders: No orders of the defined types were placed in this encounter.  No orders of the defined types were placed in this encounter.   Face-to-face time spent with patient was *** minutes. Greater than 50% of time was spent in counseling and coordination of care.  Follow-Up Instructions: No follow-ups on file.   Gearldine Bienenstock, PA-C  Note - This record has been created using Dragon software.  Chart creation errors have been sought, but may not always  have been located. Such creation errors do not reflect on  the standard of  medical care.

## 2023-02-27 ENCOUNTER — Other Ambulatory Visit: Payer: Self-pay | Admitting: Physician Assistant

## 2023-02-27 DIAGNOSIS — L4050 Arthropathic psoriasis, unspecified: Secondary | ICD-10-CM

## 2023-02-27 MED ORDER — RINVOQ 15 MG PO TB24: 15.0000 mg | ORAL_TABLET | Freq: Every day | ORAL | 0 refills | Status: DC

## 2023-02-27 NOTE — Telephone Encounter (Signed)
Last Fill: 12/03/2022  Eye exam:  04/09/2022 WNL   Labs: 02/12/2023 RBC 4.06, CO2 33, Anion Gap 5, Creat. 0.57  Next Visit: 03/11/2023  Last Visit: 02/18/2023  VW:[id number] arthropathy   Current Dose per office note 02/18/2023: Plaquenil 200 mg 1 tab in the morning and half tablet in the evening.   Okay to refill Plaquenil?

## 2023-02-27 NOTE — Telephone Encounter (Signed)
Rinvoq copay card per Complete Pro portal: ID: [id number] Issued:02/25/2023 GROUP: [id number] BIN: [id number] PCN: [id number]  Provided to [facility] via online portal chat.  Left VM for patient. MyChart message snet to pt  [name], PharmD, MPH, BCPS, CPP Clinical Pharmacist (Rheumatology and Pulmonology)

## 2023-03-09 NOTE — Telephone Encounter (Signed)
Spoke with patient - she's contacted [facility] last week who advised her to call back mid-week this week. Recommended that she call clinic if no resolution by end of week so that we may help troubleshoot. She verbalized understanding  [name], PharmD, MPH, BCPS, CPP Clinical Pharmacist (Rheumatology and Pulmonology)

## 2023-03-11 ENCOUNTER — Ambulatory Visit: Payer: BC Managed Care – PPO | Admitting: Physician Assistant

## 2023-03-11 DIAGNOSIS — Z8639 Personal history of other endocrine, nutritional and metabolic disease: Secondary | ICD-10-CM

## 2023-03-11 DIAGNOSIS — Z9884 Bariatric surgery status: Secondary | ICD-10-CM

## 2023-03-11 DIAGNOSIS — S92414D Nondisplaced fracture of proximal phalanx of right great toe, subsequent encounter for fracture with routine healing: Secondary | ICD-10-CM

## 2023-03-11 DIAGNOSIS — L409 Psoriasis, unspecified: Secondary | ICD-10-CM

## 2023-03-11 DIAGNOSIS — M5030 Other cervical disc degeneration, unspecified cervical region: Secondary | ICD-10-CM

## 2023-03-11 DIAGNOSIS — Z79899 Other long term (current) drug therapy: Secondary | ICD-10-CM

## 2023-03-11 DIAGNOSIS — L4050 Arthropathic psoriasis, unspecified: Secondary | ICD-10-CM

## 2023-03-11 DIAGNOSIS — M47816 Spondylosis without myelopathy or radiculopathy, lumbar region: Secondary | ICD-10-CM

## 2023-03-11 DIAGNOSIS — M8589 Other specified disorders of bone density and structure, multiple sites: Secondary | ICD-10-CM

## 2023-03-11 DIAGNOSIS — N3010 Interstitial cystitis (chronic) without hematuria: Secondary | ICD-10-CM

## 2023-03-11 DIAGNOSIS — M797 Fibromyalgia: Secondary | ICD-10-CM

## 2023-03-11 DIAGNOSIS — M62838 Other muscle spasm: Secondary | ICD-10-CM

## 2023-03-11 DIAGNOSIS — Z8659 Personal history of other mental and behavioral disorders: Secondary | ICD-10-CM

## 2023-03-11 DIAGNOSIS — Z8709 Personal history of other diseases of the respiratory system: Secondary | ICD-10-CM

## 2023-03-11 DIAGNOSIS — R5383 Other fatigue: Secondary | ICD-10-CM

## 2023-03-16 ENCOUNTER — Other Ambulatory Visit: Payer: Self-pay | Admitting: Rheumatology

## 2023-04-09 ENCOUNTER — Other Ambulatory Visit: Payer: Self-pay | Admitting: Physician Assistant

## 2023-04-09 NOTE — Telephone Encounter (Signed)
Last Fill: 10/03/2022  Next Visit: 04/23/2023  Last Visit: 02/18/2024  Dx: Psoriatic arthropathy   Current Dose per office note on 02/18/2023: folic acid 2 mg daily   Okay to refill Folic Acid?

## 2023-04-10 NOTE — Progress Notes (Signed)
 Office Visit Note  Patient: Michelle [name]             Date of Birth: Dec 08, 1975           MRN: [id number]             PCP: Karie Norleen BROCKS., MD Referring: Karie Norleen BROCKS., MD Visit Date: 04/23/2023 Occupation: @GUAROCC @  Subjective:  Medication monitoring   History of Present Illness: Michelle [name] is a 47 y.o. female with history of psoriatic arthritis and fibromyalgia.  Patient remains on Rinvoq  15 mg 1 tablet by mouth daily, Methotrexate  6 tabs po once weekly, folic acid  2 mg daily, Plaquenil  200 mg 1 tab in AM and half tablet in the evening.  Patient started Rinvoq  in mid November 2024.  She has been tolerating Rinvoq  without any side effects and has not had any recent gaps in therapy.  She denies any recent or recurrent infections.  She has noticed a significant improvement in her energy level since initiating Rinvoq .  She states that the aching and swelling has also improved.  Overall she has noticed about a 70% improvement in her symptoms since initiating Rinvoq . She denies any active psoriasis at this time.   Patient has been under the care of orthopedics since she fractured her left ankle.  She will be scheduling an updated bone density, mammogram, and Plaquenil  examination today.  Activities of Daily Living:  Patient reports morning stiffness for 1 hour.   Patient Reports nocturnal pain.  Difficulty dressing/grooming: Reports Difficulty climbing stairs: Reports Difficulty getting out of chair: Reports Difficulty using hands for taps, buttons, cutlery, and/or writing: Reports  Review of Systems  Constitutional:  Negative for fatigue.  HENT:  Positive for mouth dryness. Negative for mouth sores.   Eyes:  Positive for dryness.  Respiratory:  Negative for shortness of breath.   Cardiovascular:  Negative for chest pain and palpitations.  Gastrointestinal:  Positive for constipation. Negative for blood in stool and diarrhea.  Endocrine: Positive for increased urination.   Genitourinary:  Negative for involuntary urination.  Musculoskeletal:  Positive for joint pain, gait problem, joint pain, joint swelling, myalgias, muscle weakness, morning stiffness, muscle tenderness and myalgias.  Skin:  Positive for color change, hair loss and sensitivity to sunlight. Negative for rash.  Allergic/Immunologic: Positive for susceptible to infections.  Neurological:  Positive for dizziness and headaches.  Hematological:  Negative for swollen glands.  Psychiatric/Behavioral:  Positive for sleep disturbance. Negative for depressed mood. The patient is not nervous/anxious.     PMFS History:  Patient Active Problem List   Diagnosis Date Noted  . Interstitial cystitis 11/24/2016  . High risk medication use 11/02/2016  . History of depression 11/02/2016  . History of asthma 11/02/2016  . Prolonged QT interval 11/02/2016  . History of gastric bypass 11/02/2016  . History of hypothyroidism 11/02/2016  . History of renal calcinosis 11/02/2016  . Psoriatic arthropathy (HCC) 04/17/2016  . Psoriasis 04/17/2016  . ANA positive 04/17/2016  . Other fatigue 04/17/2016  . DDD (degenerative disc disease), cervical 04/17/2016  . DDD (degenerative disc disease), lumbar 04/17/2016  . History of diabetes mellitus 04/17/2016  . History of migraine 04/17/2016  . Fatty liver 04/17/2016  . Diabetes (HCC)   . Depression   . Fibromyalgia   . Muscle cramps     Past Medical History:  Diagnosis Date  . Broken ankle   . Depression   . Diabetes (HCC)   . Fibromyalgia   . Migraine   .  Muscle cramps   . Psoriasis     Family History  Problem Relation Age of Onset  . Cancer Mother   . Cancer Father   . Diabetes Sister   . Cancer Brother    Past Surgical History:  Procedure Laterality Date  . ABDOMINAL HYSTERECTOMY    . CESAREAN SECTION    . CHOLECYSTECTOMY    . FACET JOINT INJECTION    . GASTRIC BYPASS    . radiofrequency injections     neck, pain management did injections   . SI Joint Injection    . tummy tuck  03/24/2018   Social History   Social History Narrative   Patient lives at home alone with her husband and she works full time disability .    Right handed.   Caffeine one times per day.   Immunization History  Administered Date(s) Administered  . Influenza Inj Mdck Quad Pf 02/13/2017  . Influenza,inj,Quad PF,6+ Mos 01/23/2016, 01/27/2018  . Influenza-Unspecified 01/23/2016  . Moderna Sars-Covid-2 Vaccination 04/17/2019, 05/21/2019, 07/24/2020  . Pneumococcal Polysaccharide-23 01/20/2012  . Tdap 07/28/2012     Objective: Vital Signs: BP 107/72 (BP Location: Left Arm, Patient Position: Sitting, Cuff Size: Normal)   Pulse 86   Resp 12   Ht 5' 4 (1.626 m)   Wt 146 lb (66.2 kg)   BMI 25.06 kg/m    Physical Exam Vitals and nursing note reviewed.  Constitutional:      Appearance: She is well-developed.  HENT:     Head: Normocephalic and atraumatic.  Eyes:     Conjunctiva/sclera: Conjunctivae normal.  Cardiovascular:     Rate and Rhythm: Normal rate and regular rhythm.     Heart sounds: Normal heart sounds.  Pulmonary:     Effort: Pulmonary effort is normal.     Breath sounds: Normal breath sounds.  Abdominal:     General: Bowel sounds are normal.     Palpations: Abdomen is soft.  Musculoskeletal:     Cervical back: Normal range of motion.  Lymphadenopathy:     Cervical: No cervical adenopathy.  Skin:    General: Skin is warm and dry.     Capillary Refill: Capillary refill takes less than 2 seconds.  Neurological:     Mental Status: She is alert and oriented to person, place, and time.  Psychiatric:        Behavior: Behavior normal.     Musculoskeletal Exam: C-spine has painful limited range of motion with both lateral rotation and flexion and extension.  Shoulder joints, elbow joints, wrist joints, MCPs, PIPs, DIPs have good range of motion with no synovitis.  Some tenderness over the left fourth PIP joint.  Complete fist  formation bilaterally.  Hip joints have good range of motion with no groin pain currently.  Knee joints have good range of motion with no warmth or effusion.  Right ankle has good range of motion with no tenderness or synovitis.  Left foot is in a walking boot.  CDAI Exam: CDAI Score: -- Patient Global: --; Provider Global: -- Swollen: --; Tender: -- Joint Exam 04/23/2023   No joint exam has been documented for this visit   There is currently no information documented on the homunculus. Go to the Rheumatology activity and complete the homunculus joint exam.  Investigation: No additional findings.  Imaging: No results found.  Recent Labs: Lab Results  Component Value Date   WBC 6.0 01/07/2023   HGB 12.1 01/07/2023   PLT 241 01/07/2023   NA  136 01/07/2023   K 4.3 01/07/2023   CL 97 (L) 01/07/2023   CO2 30 01/07/2023   GLUCOSE 55 (L) 01/07/2023   BUN 6 (L) 01/07/2023   CREATININE 0.59 01/07/2023   BILITOT 0.4 01/07/2023   ALKPHOS 113 10/29/2016   AST 23 01/07/2023   ALT 15 01/07/2023   PROT 6.8 01/07/2023   ALBUMIN 4.3 10/29/2016   CALCIUM 9.3 01/07/2023   GFRAA 128 01/10/2019   QFTBGOLDPLUS NEGATIVE 11/19/2022    Speciality Comments: PLQ Eye Exam:  04/09/2022 WNL at [facility]. Follow up 1 year.  MTX + plaquenil  in combination with biologics  Humira (waning response) -> Enbrel  (stopped 11/17/22); Taltz  started 11/27/22 (stopped 12/25/22 due to diarrhea) -> Simponi  SQ started 01/26/23 (rash and BP elevation) -> Rinvoq   Procedures:  No procedures performed Allergies: Zoledronic acid, Duloxetine, Flunisolide, Vilazodone, Latex, and Tape   Assessment / Plan:     Visit Diagnoses: Psoriatic arthropathy (HCC) - She has no synovitis or dactylitis on examination today.  She has noticed a 70% improvement in her symptoms since initiating Rinvoq .  She is currently taking Rinvoq  15 mg 1 tablet by mouth daily, methotrexate  6 tablets by mouth once weekly, folic acid  2 mg daily,  and Plaquenil  200 mg 1 tab in the morning and half tablet in the evening.  She has been tolerating combination therapy without any side effects.  No recent gaps in therapy.  Her energy level has improved.  She has been experiencing less generalized aching in her joint swelling has improved.  Plan to update CBC and CMP today.  She is not fasting so we will plan to update lipid panel with next lab work.  She will remain on the current treatment regimen.  No medication changes will be made at this time.  She was advised to notify us  if she develops any new or worsening symptoms.  She will follow-up in the office in 5 months or sooner if needed.  Plan: Lipid panel  Psoriasis: No active psoriasis at this time.  She will remain on the current treatment combination.  High risk medication use - Rinvoq  15 mg 1 tablet by mouth daily, Methotrexate  6 tablets by mouth once weekly, folic acid  2 mg daily, Plaquenil  200 mg 1 tab in AM and half tablet in the evening.  CBC and CMP updated on 02/12/23.  Orders for CBC and CMP released today.  TB gold negative on 11/19/22.  Discussed the importance of holding rinvoq  and methotrexate  if she develops signs or symptoms of an infection and to resume once the infection has completely cleared.   PLQ Eye Exam: 04/09/2022 WNL at [facility]. Follow up 1 year.  - Plan: CBC with Differential/Platelet, COMPLETE METABOLIC PANEL WITH GFR, Lipid panel  Lipid screening - Patient was not fasting today. Future order for lipid panel placed today. Plan: Lipid panel  Spondylosis of lumbar spine: Chronic pain.   Fibromyalgia: She experiences intermittent myalgias and muscle tenderness due to fibromyalgia.  Her energy level has improved.  Trapezius muscle spasm: She has limited ROM of the cervical spine.  She has trapezius muscle tension and tenderness bilaterally.   Other fatigue: Improving since starting rinvoq .    DDD (degenerative disc disease), cervical: Chronic pain.   Inadequate response to last ablation performed.  She has limited range of motion with lateral rotation and flexion and extension.    Osteopenia of multiple sites - DEXA 11/30/20: BMD 0.617 with T score -2.1. no comparison. Patient was due  to update DEXA in August 2024-order in place.  Patient plans to call to schedule updated DEXA and mammogram today.    History of fracture of left ankle: She is currently wearing a walking boot and is under the care of orthopedics.   Due to update DEXA.    Closed nondisplaced fracture of proximal phalanx of right great toe with routine healing, subsequent encounter  Other medical conditions are listed as follows:   Interstitial cystitis  History of gastric bypass  History of diabetes mellitus  History of asthma  History of hypothyroidism  History of depression   Orders: Orders Placed This Encounter  Procedures  . CBC with Differential/Platelet  . COMPLETE METABOLIC PANEL WITH GFR  . Lipid panel   No orders of the defined types were placed in this encounter.   Follow-Up Instructions: Return in about 3 months (around 07/22/2023) for Psoriatic arthritis, Fibromyalgia.   Michelle CHRISTELLA Craze, PA-C  Note - This record has been created using Dragon software.  Chart creation errors have been sought, but may not always  have been located. Such creation errors do not reflect on  the standard of medical care.

## 2023-04-16 ENCOUNTER — Other Ambulatory Visit: Payer: Self-pay | Admitting: Rheumatology

## 2023-04-16 NOTE — Telephone Encounter (Signed)
Last Fill: 01/16/2023  Labs: 02/12/2023 RBC 4.06, CO2 33, Anion Gap 5, Creat. 0.57  Next Visit: 04/23/2023  Last Visit: 02/18/2023  DX: Psoriatic arthropathy   Current Dose per office note 02/18/2023: Methotrexate 6 tabs po once weekly   Okay to refill Methotrexate?

## 2023-04-23 ENCOUNTER — Encounter: Payer: Self-pay | Admitting: Physician Assistant

## 2023-04-23 ENCOUNTER — Ambulatory Visit: Payer: BC Managed Care – PPO | Attending: Physician Assistant | Admitting: Physician Assistant

## 2023-04-23 VITALS — BP 107/72 | HR 86 | Resp 12 | Ht 64.0 in | Wt 146.0 lb

## 2023-04-23 DIAGNOSIS — L4050 Arthropathic psoriasis, unspecified: Secondary | ICD-10-CM

## 2023-04-23 DIAGNOSIS — M8589 Other specified disorders of bone density and structure, multiple sites: Secondary | ICD-10-CM

## 2023-04-23 DIAGNOSIS — Z9884 Bariatric surgery status: Secondary | ICD-10-CM

## 2023-04-23 DIAGNOSIS — R5383 Other fatigue: Secondary | ICD-10-CM

## 2023-04-23 DIAGNOSIS — M47816 Spondylosis without myelopathy or radiculopathy, lumbar region: Secondary | ICD-10-CM

## 2023-04-23 DIAGNOSIS — Z8781 Personal history of (healed) traumatic fracture: Secondary | ICD-10-CM

## 2023-04-23 DIAGNOSIS — Z8659 Personal history of other mental and behavioral disorders: Secondary | ICD-10-CM

## 2023-04-23 DIAGNOSIS — M62838 Other muscle spasm: Secondary | ICD-10-CM

## 2023-04-23 DIAGNOSIS — S92414D Nondisplaced fracture of proximal phalanx of right great toe, subsequent encounter for fracture with routine healing: Secondary | ICD-10-CM

## 2023-04-23 DIAGNOSIS — M5030 Other cervical disc degeneration, unspecified cervical region: Secondary | ICD-10-CM

## 2023-04-23 DIAGNOSIS — Z79899 Other long term (current) drug therapy: Secondary | ICD-10-CM

## 2023-04-23 DIAGNOSIS — N3010 Interstitial cystitis (chronic) without hematuria: Secondary | ICD-10-CM

## 2023-04-23 DIAGNOSIS — M797 Fibromyalgia: Secondary | ICD-10-CM

## 2023-04-23 DIAGNOSIS — L409 Psoriasis, unspecified: Secondary | ICD-10-CM

## 2023-04-23 DIAGNOSIS — Z8709 Personal history of other diseases of the respiratory system: Secondary | ICD-10-CM

## 2023-04-23 DIAGNOSIS — Z8639 Personal history of other endocrine, nutritional and metabolic disease: Secondary | ICD-10-CM

## 2023-04-23 DIAGNOSIS — Z13220 Encounter for screening for lipoid disorders: Secondary | ICD-10-CM

## 2023-04-24 LAB — COMPLETE METABOLIC PANEL WITH GFR
AG Ratio: 1.8 (calc) (ref 1.0–2.5)
ALT: 28 U/L (ref 6–29)
AST: 27 U/L (ref 10–35)
Albumin: 4.2 g/dL (ref 3.6–5.1)
Alkaline phosphatase (APISO): 91 U/L (ref 31–125)
BUN: 12 mg/dL (ref 7–25)
CO2: 29 mmol/L (ref 20–32)
Calcium: 9.2 mg/dL (ref 8.6–10.2)
Chloride: 98 mmol/L (ref 98–110)
Creat: 0.64 mg/dL (ref 0.50–0.99)
Globulin: 2.4 g/dL (ref 1.9–3.7)
Glucose, Bld: 63 mg/dL — ABNORMAL LOW (ref 65–99)
Potassium: 5.2 mmol/L (ref 3.5–5.3)
Sodium: 133 mmol/L — ABNORMAL LOW (ref 135–146)
Total Bilirubin: 0.3 mg/dL (ref 0.2–1.2)
Total Protein: 6.6 g/dL (ref 6.1–8.1)
eGFR: 108 mL/min/{1.73_m2} (ref >=60)

## 2023-04-24 LAB — CBC WITH DIFFERENTIAL/PLATELET
Absolute Lymphocytes: 1537 {cells}/uL (ref 850–3900)
Absolute Monocytes: 571 {cells}/uL (ref 200–950)
Basophils Absolute: 21 {cells}/uL (ref 0–200)
Basophils Relative: 0.5 %
Eosinophils Absolute: 139 {cells}/uL (ref 15–500)
Eosinophils Relative: 3.3 %
HCT: 35.1 % (ref 35.0–45.0)
Hemoglobin: 11.5 g/dL — ABNORMAL LOW (ref 11.7–15.5)
MCH: 30.5 pg (ref 27.0–33.0)
MCHC: 32.8 g/dL (ref 32.0–36.0)
MCV: 93.1 fL (ref 80.0–100.0)
MPV: 10 fL (ref 7.5–12.5)
Monocytes Relative: 13.6 %
Neutro Abs: 1932 {cells}/uL (ref 1500–7800)
Neutrophils Relative %: 46 %
Platelets: 254 10*3/uL (ref 140–400)
RBC: 3.77 10*6/uL — ABNORMAL LOW (ref 3.80–5.10)
RDW: 12.6 % (ref 11.0–15.0)
Total Lymphocyte: 36.6 %
WBC: 4.2 10*3/uL (ref 3.8–10.8)

## 2023-04-24 NOTE — Progress Notes (Signed)
 RBC count and hemoglobin are slightly low. Rest of CBC WNL.  Please clarify if she is taking a multivitamin with iron? Sodium is low-133. Glucose low-63.  Rest of CMP WNL.

## 2023-05-29 ENCOUNTER — Other Ambulatory Visit: Payer: Self-pay | Admitting: Physician Assistant

## 2023-05-29 DIAGNOSIS — L4050 Arthropathic psoriasis, unspecified: Secondary | ICD-10-CM

## 2023-05-29 DIAGNOSIS — Z79899 Other long term (current) drug therapy: Secondary | ICD-10-CM

## 2023-05-29 DIAGNOSIS — L409 Psoriasis, unspecified: Secondary | ICD-10-CM

## 2023-05-29 NOTE — Telephone Encounter (Signed)
 Last Fill: 02/27/2023  Labs: 04/23/2023 RBC count and hemoglobin are slightly low. Rest of CBC WNL. Sodium is low-133. Glucose low-63.  Rest of CMP WNL.   TB Gold: 11/19/2022   Next Visit: 07/22/2023  Last Visit: 04/23/2023  DX: Psoriatic arthropathy   Current Dose per office note 04/23/2023: Rinvoq  15 mg 1 tablet by mouth daily   Okay to refill Rinvoq ?

## 2023-06-08 ENCOUNTER — Other Ambulatory Visit: Payer: Self-pay | Admitting: Physician Assistant

## 2023-06-08 DIAGNOSIS — L4050 Arthropathic psoriasis, unspecified: Secondary | ICD-10-CM

## 2023-06-08 MED ORDER — HYDROXYCHLOROQUINE SULFATE 200 MG PO TABS: ORAL_TABLET | ORAL | 0 refills | Status: DC

## 2023-06-08 NOTE — Telephone Encounter (Signed)
Last Fill: 02/27/2023  Eye exam: 04/09/2022 WNL   Labs: 04/23/2023 RBC count and hemoglobin are slightly low. Rest of CBC WNL Sodium is low-133. Glucose low-63.  Rest of CMP WNL.   Next Visit: 07/22/2023  Last Visit: 04/23/2023  ZO:[id number] arthropathy   Current Dose per office note 04/23/2023: Plaquenil 200 mg 1 tab in AM and half tablet in the evening   Patient states she has an appointment to update her PLQ eye exam at the end of this month.   Okay to refill Plaquenil?

## 2023-07-06 ENCOUNTER — Other Ambulatory Visit: Payer: Self-pay | Admitting: Physician Assistant

## 2023-07-06 NOTE — Telephone Encounter (Signed)
 Last Fill: 04/16/2023  Labs: 04/23/2023 RBC count and hemoglobin are slightly low. Rest of CBC WNL.  Please clarify if she is taking a multivitamin with iron? Sodium is low-133. Glucose low-63.  Rest of CMP WNL.  Next Visit: 07/22/2023   Last Visit: 04/23/2023  DX: Psoriatic arthropathy   Current Dose per office note 04/23/2023: Methotrexate 6 tablets by mouth once weekly,   Okay to refill Methotrexate?

## 2023-07-08 NOTE — Progress Notes (Unsigned)
 Office Visit Note  Patient: Michelle [name]             Date of Birth: 1975-12-09           MRN: [id number]             PCP: Lester Charlotte Park., MD Referring: Lester Kanosh., MD Visit Date: 07/22/2023 Occupation: @GUAROCC @  Subjective:  Medication monitoring   History of Present Illness: Michelle [name] is a 48 y.o. female with history of psoriatic arthritis and fibromyalgia. She remains on Rinvoq 15 mg 1 tablet by mouth daily, Methotrexate 6 tablets by mouth once weekly, folic acid 2 mg daily, Plaquenil 200 mg 1 tab in AM, 1/2 tab evening.  She is tolerating combination therapy without any side effects and has not had any recent gaps in therapy.  No recent or recurrent infections.  Patient reports that she has had a 70 to 80% improvement in her joint pain and stiffness since initiating Rinvoq.  Patient states that her fatigue level has improved by 50% as well.  She has been trying to go to water aerobics 2 days a week and go to the gym 2 days a week for exercise. She denies any new medical conditions.     Activities of Daily Living:  Patient reports morning stiffness for 30 minutes.   Patient Reports nocturnal pain.  Difficulty dressing/grooming: Reports Difficulty climbing stairs: Reports Difficulty getting out of chair: Reports Difficulty using hands for taps, buttons, cutlery, and/or writing: Reports  Review of Systems  Constitutional:  Positive for fatigue.  HENT:  Positive for mouth sores, mouth dryness and nose dryness.   Eyes:  Positive for dryness. Negative for pain.  Respiratory:  Positive for shortness of breath. Negative for difficulty breathing.   Cardiovascular:  Negative for chest pain and palpitations.  Gastrointestinal:  Negative for blood in stool, constipation and diarrhea.  Endocrine: Negative for increased urination.  Genitourinary:  Negative for involuntary urination.  Musculoskeletal:  Positive for joint pain, gait problem, joint pain, myalgias, muscle  weakness, morning stiffness, muscle tenderness and myalgias. Negative for joint swelling.  Skin:  Positive for color change and sensitivity to sunlight. Negative for rash and hair loss.  Allergic/Immunologic: Positive for susceptible to infections.  Neurological:  Positive for numbness and headaches. Negative for dizziness.  Hematological:  Negative for swollen glands.  Psychiatric/Behavioral:  Positive for sleep disturbance. Negative for depressed mood. The patient is not nervous/anxious.     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   Psoriatic arthropathy (HCC) 04/17/2016   Psoriasis 04/17/2016   ANA positive 04/17/2016   Other fatigue 04/17/2016   DDD (degenerative disc disease), cervical 04/17/2016   DDD (degenerative disc disease), lumbar 04/17/2016   History of diabetes mellitus 04/17/2016   History of migraine 04/17/2016   Fatty liver 04/17/2016   Diabetes (HCC)    Depression    Fibromyalgia    Muscle cramps     Past Medical History:  Diagnosis Date   Broken ankle    Depression    Diabetes (HCC)    Fibromyalgia    Migraine    Muscle cramps    Psoriasis     Family  History  Problem Relation Age of Onset   Cancer Mother    Cancer Father    Diabetes Sister    Cancer Brother    Past Surgical History:  Procedure Laterality Date   ABDOMINAL HYSTERECTOMY     CESAREAN SECTION     CHOLECYSTECTOMY     FACET JOINT INJECTION     GASTRIC BYPASS     radiofrequency injections     neck, pain management did injections   SI Joint Injection     tummy tuck  03/24/2018   Social History   Social History Narrative   Patient lives at home alone with her  husband and she works full time disability .    Right handed.   Caffeine one times per day.   Immunization History  Administered Date(s) Administered   Influenza Inj Mdck Quad Pf 02/13/2017   Influenza,inj,Quad PF,6+ Mos 01/23/2016, 01/27/2018   Influenza-Unspecified 01/23/2016   Moderna Sars-Covid-2 Vaccination 04/17/2019, 05/21/2019, 07/24/2020   Pneumococcal Polysaccharide-23 01/20/2012   Tdap 07/28/2012     Objective: Vital Signs: BP 124/83 (BP Location: Left Arm, Patient Position: Sitting, Cuff Size: Normal)   Pulse 83   Resp 15   Ht 5\' 4"  (1.626 m)   Wt 147 lb (66.7 kg)   BMI 25.23 kg/m    Physical Exam Vitals and nursing note reviewed.  Constitutional:      Appearance: She is well-developed.  HENT:     Head: Normocephalic and atraumatic.  Eyes:     Conjunctiva/sclera: Conjunctivae normal.  Cardiovascular:     Rate and Rhythm: Normal rate and regular rhythm.     Heart sounds: Normal heart sounds.  Pulmonary:     Effort: Pulmonary effort is normal.     Breath sounds: Normal breath sounds.  Abdominal:     General: Bowel sounds are normal.     Palpations: Abdomen is soft.  Musculoskeletal:     Cervical back: Normal range of motion.  Lymphadenopathy:     Cervical: No cervical adenopathy.  Skin:    General: Skin is warm and dry.     Capillary Refill: Capillary refill takes less than 2 seconds.  Neurological:     Mental Status: She is alert and oriented to person, place, and time.  Psychiatric:        Behavior: Behavior normal.      Musculoskeletal Exam: C-spine has painful range of motion especially with flexion and extension.  Shoulder joints, elbow joints, wrist joints, MCPs, PIPs, DIPs have good range of motion with no synovitis.  Complete fist formation bilaterally.  Hip joints have good range of motion.  Tenderness over bilateral trochanteric bursa, right greater than left.  Knee joints have good range of motion with no warmth or effusion.  Ankle joints  have good range of motion with tenderness of the left ankle.  Tenderness of PIP joints of the left foot.  Left 2nd hammertoe noted.   CDAI Exam: CDAI Score: -- Patient Global: --; Provider Global: -- Swollen: --; Tender: -- Joint Exam 07/22/2023   No joint exam has been documented for this visit   There is currently no information documented on the homunculus. Go to the Rheumatology activity and complete the homunculus joint exam.  Investigation: No additional findings.  Imaging: No results found.  Recent Labs: Lab Results  Component Value Date   WBC 4.2 04/23/2023   HGB 11.5 (L) 04/23/2023   PLT 254 04/23/2023   NA 133 (L) 04/23/2023   K 5.2 04/23/2023  CL 98 04/23/2023   CO2 29 04/23/2023   GLUCOSE 63 (L) 04/23/2023   BUN 12 04/23/2023   CREATININE 0.64 04/23/2023   BILITOT 0.3 04/23/2023   ALKPHOS 113 10/29/2016   AST 27 04/23/2023   ALT 28 04/23/2023   PROT 6.6 04/23/2023   ALBUMIN 4.3 10/29/2016   CALCIUM 9.2 04/23/2023   GFRAA 128 01/10/2019   QFTBGOLDPLUS NEGATIVE 11/19/2022    Speciality Comments: PLQ Eye Exam:  04/09/2022 WNL at [facility]. Follow up 1 year.  MTX + plaquenil in combination with biologics  Humira (waning response) -> Enbrel (stopped 11/17/22); Taltz started 11/27/22 (stopped 12/25/22 due to diarrhea) -> Simponi SQ started 01/26/23 (rash and BP elevation) -> Rinvoq  Procedures:  No procedures performed Allergies: Zoledronic acid, Duloxetine, Flunisolide, Vilazodone, Latex, and Tape    Assessment / Plan:     Visit Diagnoses: Psoriatic arthropathy (HCC) - She has no synovitis or dactylitis on examination today.  She has not had any signs or symptoms of a psoriatic arthritis flare.  She has clinically been doing well taking Rinvoq 15 mg 1 tablet by mouth daily, methotrexate 6 tablets by mouth once weekly, folic acid 2 mg daily, and Plaquenil 200 mg 1 tablet in the morning and half tablet in the evening.  She has noticed a 75 to 80%  improvement in her joint pain and stiffness since initiating Rinvoq.  She has also had a 50% improvement in her energy level since adding Rinvoq.  She is tolerating combination therapy without any side effects and has not had any recent gaps in therapy.  No recent or recurrent infections.  She has no active psoriasis at this time.  No medication changes will be made at this time.  Discussed that once her symptoms are stable we can try tapering off of Plaquenil.  She will follow-up in the office in 3 to 4 months. Plan: Lipid panel  Psoriasis: No active psoriasis at this time.  High risk medication use - Rinvoq 15 mg 1 tablet by mouth daily, Methotrexate 6 tablets by mouth once weekly, folic acid 2 mg daily, Plaquenil 200 mg 1 tab in AM, 1/2 tab evening. CBC and CMP 04/23/23. Orders for CBC and CMP released today.  Lipid panel pending  TB gold negative on 11/19/22.   No recent or recurrent infections.  Discussed the importance of holding rinvoq and methotrexate if she develops signs or symptoms of an infection and to resume once the infection has completely cleared.  PLQ Eye Exam: 04/09/2022 WNL at [facility]. Patient has had an updated plaquenil eye exam-plan to call to obtain records.  Follow up with dermatology scheduled November 2025.   - Plan: CBC with Differential/Platelet, Comprehensive metabolic panel with GFR, Lipid panel, Comprehensive metabolic panel with GFR, CBC with Differential/Platelet  Lipid screening -Patient plans on having a lipid panel drawn at her PCPs office tomorrow while fasting.  Plan: Lipid panel  Spondylosis of lumbar spine: Under the care of pain management.  Fibromyalgia: She continues to have generalized myalgias and muscle tenderness due to fibromyalgia.  Under the care of pain management.  Trapezius muscle spasm: Patient had trigger point injections performed at pain management in February 2025.  Other fatigue: She has noticed a 50% improvement in her  fatigue since initiating rinvoq.   DDD (degenerative disc disease), cervical: Patient has been experiencing some increased pain and stiffness in the cervical spine.  She has no symptoms of radiculopathy.  Osteopenia of multiple sites - DEXA  11/30/20: BMD 0.617 with T score -2.1. no comparison. Patient was due to update DEXA in August 2024-order in place.  Other medical conditions are listed as follows:   Closed nondisplaced fracture of proximal phalanx of right great toe with routine healing, subsequent encounter  Interstitial cystitis  History of gastric bypass  History of diabetes mellitus  History of asthma  History of hypothyroidism  History of depression      Orders: Orders Placed This Encounter  Procedures   CBC with Differential/Platelet   Comprehensive metabolic panel with GFR   No orders of the defined types were placed in this encounter.     Follow-Up Instructions: Return in about 3 months (around 10/21/2023) for Psoriatic arthritis, Fibromyalgia.   Gearldine Bienenstock, PA-C  Note - This record has been created using Dragon software.  Chart creation errors have been sought, but may not always  have been located. Such creation errors do not reflect on  the standard of medical care.

## 2023-07-22 ENCOUNTER — Encounter: Payer: Self-pay | Admitting: Physician Assistant

## 2023-07-22 ENCOUNTER — Ambulatory Visit: Payer: BC Managed Care – PPO | Attending: Physician Assistant | Admitting: Physician Assistant

## 2023-07-22 VITALS — BP 124/83 | HR 83 | Resp 15 | Ht 64.0 in | Wt 147.0 lb

## 2023-07-22 DIAGNOSIS — S92414D Nondisplaced fracture of proximal phalanx of right great toe, subsequent encounter for fracture with routine healing: Secondary | ICD-10-CM

## 2023-07-22 DIAGNOSIS — Z8639 Personal history of other endocrine, nutritional and metabolic disease: Secondary | ICD-10-CM

## 2023-07-22 DIAGNOSIS — R5383 Other fatigue: Secondary | ICD-10-CM

## 2023-07-22 DIAGNOSIS — N3010 Interstitial cystitis (chronic) without hematuria: Secondary | ICD-10-CM

## 2023-07-22 DIAGNOSIS — M47816 Spondylosis without myelopathy or radiculopathy, lumbar region: Secondary | ICD-10-CM

## 2023-07-22 DIAGNOSIS — L4050 Arthropathic psoriasis, unspecified: Secondary | ICD-10-CM

## 2023-07-22 DIAGNOSIS — L409 Psoriasis, unspecified: Secondary | ICD-10-CM | POA: Diagnosis not present

## 2023-07-22 DIAGNOSIS — Z79899 Other long term (current) drug therapy: Secondary | ICD-10-CM | POA: Diagnosis not present

## 2023-07-22 DIAGNOSIS — Z9884 Bariatric surgery status: Secondary | ICD-10-CM

## 2023-07-22 DIAGNOSIS — Z8659 Personal history of other mental and behavioral disorders: Secondary | ICD-10-CM

## 2023-07-22 DIAGNOSIS — M5030 Other cervical disc degeneration, unspecified cervical region: Secondary | ICD-10-CM

## 2023-07-22 DIAGNOSIS — Z8709 Personal history of other diseases of the respiratory system: Secondary | ICD-10-CM

## 2023-07-22 DIAGNOSIS — M62838 Other muscle spasm: Secondary | ICD-10-CM

## 2023-07-22 DIAGNOSIS — M8589 Other specified disorders of bone density and structure, multiple sites: Secondary | ICD-10-CM

## 2023-07-22 DIAGNOSIS — M797 Fibromyalgia: Secondary | ICD-10-CM

## 2023-07-22 DIAGNOSIS — Z13220 Encounter for screening for lipoid disorders: Secondary | ICD-10-CM

## 2023-07-27 ENCOUNTER — Telehealth: Payer: Self-pay | Admitting: Pharmacist

## 2023-07-27 NOTE — Telephone Encounter (Unsigned)
 Submitted a Prior Authorization RENEWAL request to [employer] for [facility] via CoverMyMeds. Will update once we receive a response.  Key: [id number]  [name], PharmD, MPH, BCPS, CPP Clinical Pharmacist (Rheumatology and Pulmonology)

## 2023-07-29 NOTE — Telephone Encounter (Signed)
 Received notification from [employer] regarding a prior authorization for [facility]. Authorization has been APPROVED from 06/27/2023 to 07/26/2024. Approval letter sent to scan center.  Authorization # [id number]  [name], PharmD, MPH, BCPS, CPP Clinical Pharmacist (Rheumatology and Pulmonology)

## 2023-08-28 ENCOUNTER — Other Ambulatory Visit: Payer: Self-pay | Admitting: Physician Assistant

## 2023-08-28 DIAGNOSIS — L4050 Arthropathic psoriasis, unspecified: Secondary | ICD-10-CM

## 2023-08-28 DIAGNOSIS — Z79899 Other long term (current) drug therapy: Secondary | ICD-10-CM

## 2023-08-28 DIAGNOSIS — L409 Psoriasis, unspecified: Secondary | ICD-10-CM

## 2023-08-28 NOTE — Telephone Encounter (Signed)
 Last Fill: 05/29/2023  Labs: 04/23/2023 RBC count and hemoglobin are slightly low. Rest of CBC WNL. Sodium is low-133. Glucose low-63.  Rest of CMP WNL.   TB Gold: 11/19/2022 Neg    Next Visit: 10/21/2023  Last Visit: 07/22/2023  DX: Psoriatic arthropathy    Current Dose per office note 07/22/2023: Rinvoq  15 mg 1 tablet by mouth daily.   Left message to advise patient she is due to update labs.   Okay to refill Rinvoq ?

## 2023-09-04 ENCOUNTER — Other Ambulatory Visit: Payer: Self-pay | Admitting: Physician Assistant

## 2023-09-04 DIAGNOSIS — L4050 Arthropathic psoriasis, unspecified: Secondary | ICD-10-CM

## 2023-09-26 ENCOUNTER — Other Ambulatory Visit: Payer: Self-pay | Admitting: Rheumatology

## 2023-09-26 DIAGNOSIS — L409 Psoriasis, unspecified: Secondary | ICD-10-CM

## 2023-09-26 DIAGNOSIS — Z79899 Other long term (current) drug therapy: Secondary | ICD-10-CM

## 2023-09-26 DIAGNOSIS — L4050 Arthropathic psoriasis, unspecified: Secondary | ICD-10-CM

## 2023-09-28 NOTE — Telephone Encounter (Signed)
 Last Fill: 08/28/2023  Labs: 04/23/2023 RBC count and hemoglobin are slightly low. Rest of CBC WNL Sodium is low-133. Glucose low-63.  Rest of CMP WNL.   LMOM for patient to update labs   TB Gold: 11/19/2022 TB Gold Negative   Next Visit: 10/21/2023  Last Visit: 07/22/2023  ZO:XWRUEAVWU arthropathy   Current Dose per office note 07/22/2023: Rinvoq  15 mg 1 tablet by mouth daily   Okay to refill Rinvoq ?

## 2023-09-28 NOTE — Telephone Encounter (Signed)
 I was able to review CBC and CMP results from May 2025 in care everywhere.   Lipid panel also updated.  TB gold will be due in July 2025.

## 2023-09-28 NOTE — Telephone Encounter (Signed)
 Patient needs labs prior to refilling prescription.  Please have her come in today for repeat labs.  The labs should include CBC with differential, CMP with GFR, TB Gold,  Lipid panel.

## 2023-10-05 ENCOUNTER — Telehealth: Payer: Self-pay | Admitting: Rheumatology

## 2023-10-05 ENCOUNTER — Other Ambulatory Visit: Payer: Self-pay | Admitting: Physician Assistant

## 2023-10-05 DIAGNOSIS — L4050 Arthropathic psoriasis, unspecified: Secondary | ICD-10-CM

## 2023-10-05 NOTE — Telephone Encounter (Signed)
 Pt is seeing eye doctor (Dr. Houston Mace ) on 11/04/23 at [facility]

## 2023-10-05 NOTE — Telephone Encounter (Signed)
 Last Fill: 06/08/2023 (PLQ), 07/06/2023 (MTX)  Eye exam: 04/09/2022 WNL    Labs: 09/10/2023 RBC 3.89, Hct 35.0, Sodium 135  Next Visit: 10/21/2023  Last Visit: 07/22/2023  ZO:XWRUEAVWU arthropathy   Current Dose per office note 07/22/2023: Methotrexate  6 tablets by mouth once weekly Plaquenil  200 mg 1 tab in AM, 1/2 tab evening.   Patient advised she is due to update her PLQ eye exam. Patient states she has had that completed and will reach out to her eye doctor and will have them fax the results.   Okay to refill Plaquenil  and MTX?

## 2023-10-07 NOTE — Progress Notes (Deleted)
 Office Visit Note  Patient: Michelle [name]             Date of Birth: [birth year]           MRN: [id number]             PCP: Almond Jaffe., MD Referring: Almond Jaffe., MD Visit Date: 10/21/2023 Occupation: @GUAROCC @  Subjective:  No chief complaint on file.   History of Present Illness: Michelle [name] is a 48 y.o. female ***     Activities of Daily Living:  Patient reports morning stiffness for *** {minute/hour:19697}.   Patient {ACTIONS;DENIES/REPORTS:21021675::Denies} nocturnal pain.  Difficulty dressing/grooming: {ACTIONS;DENIES/REPORTS:21021675::Denies} Difficulty climbing stairs: {ACTIONS;DENIES/REPORTS:21021675::Denies} Difficulty getting out of chair: {ACTIONS;DENIES/REPORTS:21021675::Denies} Difficulty using hands for taps, buttons, cutlery, and/or writing: {ACTIONS;DENIES/REPORTS:21021675::Denies}  No Rheumatology ROS completed.   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   Psoriatic arthropathy (HCC) 04/17/2016   Psoriasis 04/17/2016   ANA positive 04/17/2016   Other fatigue 04/17/2016   DDD (degenerative disc disease), cervical 04/17/2016   DDD (degenerative disc disease), lumbar 04/17/2016   History of diabetes mellitus 04/17/2016   History of migraine 04/17/2016   Fatty liver 04/17/2016   Diabetes (HCC)    Depression    Fibromyalgia    Muscle cramps     Past Medical History:  Diagnosis Date   Broken ankle    Depression    Diabetes (HCC)    Fibromyalgia    Migraine    Muscle cramps    Psoriasis     Family History  Problem Relation Age of Onset   Cancer Mother    Cancer Father    Diabetes Sister    Cancer  Brother    Past Surgical History:  Procedure Laterality Date   ABDOMINAL HYSTERECTOMY     CESAREAN SECTION     CHOLECYSTECTOMY     FACET JOINT INJECTION     GASTRIC BYPASS     radiofrequency injections     neck, pain management did injections   SI Joint Injection     tummy tuck  03/24/2018   Social History   Social History Narrative   Patient lives at home alone with her husband and she works full time disability .    Right handed.   Caffeine one times per day.   Immunization History  Administered Date(s) Administered   Influenza Inj Mdck Quad Pf 02/13/2017   Influenza,inj,Quad PF,6+ Mos 01/23/2016, 01/27/2018   Influenza-Unspecified 01/23/2016   Moderna Sars-Covid-2 Vaccination 04/17/2019, 05/21/2019, 07/24/2020   Pneumococcal Polysaccharide-23 01/20/2012   Tdap 07/28/2012     Objective: Vital Signs: There were no vitals taken for this visit.   Physical Exam   Musculoskeletal Exam: ***  CDAI Exam: CDAI Score: -- Patient Global: --; Provider Global: -- Swollen: --; Tender: -- Joint Exam 10/21/2023   No joint exam has been documented for this visit   There is currently no information documented on the homunculus. Go to the Rheumatology activity and complete the homunculus joint exam.  Investigation: No additional findings.  Imaging: No results found.  Recent Labs: Lab Results  Component Value Date   WBC 4.2 04/23/2023   HGB 11.5 (L) 04/23/2023   PLT  254 04/23/2023   NA 133 (L) 04/23/2023   K 5.2 04/23/2023   CL 98 04/23/2023   CO2 29 04/23/2023   GLUCOSE 63 (L) 04/23/2023   BUN 12 04/23/2023   CREATININE 0.64 04/23/2023   BILITOT 0.3 04/23/2023   ALKPHOS 113 10/29/2016   AST 27 04/23/2023   ALT 28 04/23/2023   PROT 6.6 04/23/2023   ALBUMIN 4.3 10/29/2016   CALCIUM 9.2 04/23/2023   GFRAA 128 01/10/2019   QFTBGOLDPLUS NEGATIVE 11/19/2022    Speciality Comments: PLQ Eye Exam:  04/09/2022 WNL at [facility]. Follow up 1 year.  Called  on 07/22/2023 to request updated PLQ eye exam.   MTX + plaquenil  in combination with biologics  Humira (waning response) -> Enbrel  (stopped 11/17/22); Taltz  started 11/27/22 (stopped 12/25/22 due to diarrhea) -> Simponi  SQ started 01/26/23 (rash and BP elevation) -> Rinvoq   Procedures:  No procedures performed Allergies: Zoledronic acid, Duloxetine, Flunisolide, Vilazodone, Latex, and Tape   Assessment / Plan:     Visit Diagnoses: Psoriatic arthropathy (HCC)  Psoriasis  High risk medication use  Spondylosis of lumbar spine  Fibromyalgia  Trapezius muscle spasm  Other fatigue  DDD (degenerative disc disease), cervical  Osteopenia of multiple sites  Closed nondisplaced fracture of proximal phalanx of right great toe with routine healing, subsequent encounter  Interstitial cystitis  History of gastric bypass  History of diabetes mellitus  History of asthma  History of hypothyroidism  History of depression  History of fracture of left ankle  Orders: No orders of the defined types were placed in this encounter.  No orders of the defined types were placed in this encounter.   Face-to-face time spent with patient was *** minutes. Greater than 50% of time was spent in counseling and coordination of care.  Follow-Up Instructions: No follow-ups on file.   Romayne Clubs, PA-C  Note - This record has been created using Dragon software.  Chart creation errors have been sought, but may not always  have been located. Such creation errors do not reflect on  the standard of medical care.

## 2023-10-21 ENCOUNTER — Ambulatory Visit: Admitting: Physician Assistant

## 2023-10-21 DIAGNOSIS — M62838 Other muscle spasm: Secondary | ICD-10-CM

## 2023-10-21 DIAGNOSIS — R5383 Other fatigue: Secondary | ICD-10-CM

## 2023-10-21 DIAGNOSIS — Z8709 Personal history of other diseases of the respiratory system: Secondary | ICD-10-CM

## 2023-10-21 DIAGNOSIS — S92414D Nondisplaced fracture of proximal phalanx of right great toe, subsequent encounter for fracture with routine healing: Secondary | ICD-10-CM

## 2023-10-21 DIAGNOSIS — L409 Psoriasis, unspecified: Secondary | ICD-10-CM

## 2023-10-21 DIAGNOSIS — M8589 Other specified disorders of bone density and structure, multiple sites: Secondary | ICD-10-CM

## 2023-10-21 DIAGNOSIS — M797 Fibromyalgia: Secondary | ICD-10-CM

## 2023-10-21 DIAGNOSIS — M5030 Other cervical disc degeneration, unspecified cervical region: Secondary | ICD-10-CM

## 2023-10-21 DIAGNOSIS — Z8781 Personal history of (healed) traumatic fracture: Secondary | ICD-10-CM

## 2023-10-21 DIAGNOSIS — N3010 Interstitial cystitis (chronic) without hematuria: Secondary | ICD-10-CM

## 2023-10-21 DIAGNOSIS — L4050 Arthropathic psoriasis, unspecified: Secondary | ICD-10-CM

## 2023-10-21 DIAGNOSIS — Z9884 Bariatric surgery status: Secondary | ICD-10-CM

## 2023-10-21 DIAGNOSIS — Z8639 Personal history of other endocrine, nutritional and metabolic disease: Secondary | ICD-10-CM

## 2023-10-21 DIAGNOSIS — M47816 Spondylosis without myelopathy or radiculopathy, lumbar region: Secondary | ICD-10-CM

## 2023-10-21 DIAGNOSIS — Z79899 Other long term (current) drug therapy: Secondary | ICD-10-CM

## 2023-10-21 DIAGNOSIS — Z8659 Personal history of other mental and behavioral disorders: Secondary | ICD-10-CM

## 2023-10-21 NOTE — Progress Notes (Signed)
 Office Visit Note  Patient: Michelle [name]             Date of Birth: June 14, 1975           MRN: [id number]             PCP: Karie Norleen BROCKS., MD Referring: Karie Norleen BROCKS., MD Visit Date: 10/27/2023 Occupation: @GUAROCC @  Subjective:  Trapezius muscle tension and tenderness   History of Present Illness: Michelle [name] is a 48 y.o. female with history of psoriatic arthritis and DDD.  Patient remains on  Rinvoq  15 mg 1 tablet by mouth daily, Methotrexate  6 tablets by mouth once weekly, folic acid  2 mg daily, Plaquenil  200 mg 1 tab in AM, 1/2 tab evening.  She is tolerating combination therapy without any side effects and has not had any recent gaps in therapy.  She denies any signs or symptoms of a psoriatic arthritis flare.  She denies active psoriasis at this time.  She denies any joint swelling. Patient presents today with trapezius muscle tension and tenderness bilaterally.  She has had more frequent tension headaches which she attributes to the increased muscular tension.  She requested trigger point injections today.  Patient has been going for massages every 2 weeks and has been going to water exercise 3 days a week which she feels has helped with her fibromyalgia.  She experiences intermittent fatigue which she attributes to fibromyalgia. She denies any recent or recurrent infections.    Activities of Daily Living:  Patient reports morning stiffness for 20 minutes.   Patient Reports nocturnal pain.  Difficulty dressing/grooming: Reports Difficulty climbing stairs: Denies Difficulty getting out of chair: Reports Difficulty using hands for taps, buttons, cutlery, and/or writing: Reports  Review of Systems  Constitutional:  Positive for fatigue.  HENT:  Positive for mouth dryness. Negative for mouth sores.   Eyes:  Positive for dryness.  Respiratory:  Negative for shortness of breath.   Cardiovascular:  Negative for chest pain and palpitations.  Gastrointestinal:  Negative for  blood in stool, constipation and diarrhea.  Endocrine: Negative for increased urination.  Genitourinary:  Negative for involuntary urination.  Musculoskeletal:  Positive for joint pain, gait problem, joint pain, joint swelling, myalgias, morning stiffness, muscle tenderness and myalgias. Negative for muscle weakness.  Skin:  Positive for color change, hair loss and sensitivity to sunlight. Negative for rash.  Allergic/Immunologic: Negative for susceptible to infections.  Neurological:  Positive for headaches. Negative for dizziness.  Hematological:  Negative for swollen glands.  Psychiatric/Behavioral:  Positive for sleep disturbance. Negative for depressed mood. The patient is not nervous/anxious.     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   Psoriatic arthropathy (HCC) 04/17/2016   Psoriasis 04/17/2016   ANA positive 04/17/2016   Other fatigue 04/17/2016   DDD (degenerative disc disease), cervical 04/17/2016   DDD (degenerative disc disease), lumbar 04/17/2016   History of diabetes mellitus 04/17/2016   History of migraine 04/17/2016   Fatty liver 04/17/2016   Diabetes (HCC)    Depression    Fibromyalgia    Muscle cramps     Past Medical History:  Diagnosis Date   Broken ankle    Depression  Diabetes (HCC)    Fibromyalgia    Migraine    Muscle cramps    Psoriasis     Family History  Problem Relation Age of Onset   Cancer Mother    Cancer Father    Diabetes Sister    Cancer Brother    Past Surgical History:  Procedure Laterality Date   ABDOMINAL HYSTERECTOMY     CESAREAN SECTION     CHOLECYSTECTOMY     FACET JOINT INJECTION     GASTRIC BYPASS      radiofrequency injections     neck, pain management did injections   SI Joint Injection     tummy tuck  03/24/2018   Social History   Social History Narrative   Patient lives at home alone with her husband and she works full time disability .    Right handed.   Caffeine one times per day.   Immunization History  Administered Date(s) Administered   Influenza Inj Mdck Quad Pf 02/13/2017   Influenza,inj,Quad PF,6+ Mos 01/23/2016, 01/27/2018   Influenza-Unspecified 01/23/2016   Moderna Sars-Covid-2 Vaccination 04/17/2019, 05/21/2019, 07/24/2020   Pneumococcal Polysaccharide-23 01/20/2012   Tdap 07/28/2012     Objective: Vital Signs: BP 111/71 (BP Location: Left Arm, Patient Position: Sitting, Cuff Size: Small)   Pulse 76   Resp 13   Ht 5' 4 (1.626 m)   Wt 148 lb 3.2 oz (67.2 kg)   BMI 25.44 kg/m    Physical Exam Vitals and nursing note reviewed.  Constitutional:      Appearance: She is well-developed.  HENT:     Head: Normocephalic and atraumatic.  Eyes:     Conjunctiva/sclera: Conjunctivae normal.  Cardiovascular:     Rate and Rhythm: Normal rate and regular rhythm.     Heart sounds: Normal heart sounds.  Pulmonary:     Effort: Pulmonary effort is normal.     Breath sounds: Normal breath sounds.  Abdominal:     General: Bowel sounds are normal.     Palpations: Abdomen is soft.  Musculoskeletal:     Cervical back: Normal range of motion.  Lymphadenopathy:     Cervical: No cervical adenopathy.  Skin:    General: Skin is warm and dry.     Capillary Refill: Capillary refill takes less than 2 seconds.  Neurological:     Mental Status: She is alert and oriented to person, place, and time.  Psychiatric:        Behavior: Behavior normal.      Musculoskeletal Exam: C-spine has limited range of motion without rotation.  Trapezius muscle tension and tenderness bilaterally.  Shoulder joints, elbow joints, wrist joints, MCPs, PIPs, DIPs have good range of motion  with no synovitis.  Complete fist formation bilaterally.  Hip joints have good range of motion with no groin pain.  Tenderness over the trochanteric bursa bilaterally.  Knee joints have good range of motion no warmth or effusion.  Ankle joints have good range of motion with no tenderness or joint swelling.  No evidence of Achilles tendinitis or plantar fasciitis.  CDAI Exam: CDAI Score: -- Patient Global: --; Provider Global: -- Swollen: --; Tender: -- Joint Exam 10/27/2023   No joint exam has been documented for this visit   There is currently no information documented on the homunculus. Go to the Rheumatology activity and complete the homunculus joint exam.  Investigation: No additional findings.  Imaging: No results found.  Recent Labs: Lab Results  Component Value Date   WBC 4.2  04/23/2023   HGB 11.5 (L) 04/23/2023   PLT 254 04/23/2023   NA 133 (L) 04/23/2023   K 5.2 04/23/2023   CL 98 04/23/2023   CO2 29 04/23/2023   GLUCOSE 63 (L) 04/23/2023   BUN 12 04/23/2023   CREATININE 0.64 04/23/2023   BILITOT 0.3 04/23/2023   ALKPHOS 113 10/29/2016   AST 27 04/23/2023   ALT 28 04/23/2023   PROT 6.6 04/23/2023   ALBUMIN 4.3 10/29/2016   CALCIUM 9.2 04/23/2023   GFRAA 128 01/10/2019   QFTBGOLDPLUS NEGATIVE 11/19/2022    Speciality Comments: PLQ Eye Exam:  04/09/2022 WNL at [facility]. Follow up 1 year.  Called on 07/22/2023 to request updated PLQ eye exam.   MTX + plaquenil  in combination with biologics  Humira (waning response) -> Enbrel  (stopped 11/17/22); Taltz  started 11/27/22 (stopped 12/25/22 due to diarrhea) -> Simponi  SQ started 01/26/23 (rash and BP elevation) -> Rinvoq   Procedures:  Trigger Point Inj  Date/Time: 10/27/2023 9:16 AM  Performed by: Cheryl Waddell HERO, PA-C Authorized by: Cheryl Waddell HERO, PA-C   Consent Given by:  Patient Site marked: the procedure site was marked   Timeout: prior to procedure the correct patient, procedure, and site was verified    Indications:  Pain Total # of Trigger Points:  2 Location: neck   Needle Size:  27 G Approach:  Dorsal Medications #1:  0.5 mL lidocaine  1 %; 10 mg triamcinolone  acetonide 40 MG/ML Medications #2:  0.5 mL lidocaine  1 %; 10 mg triamcinolone  acetonide 40 MG/ML Patient tolerance:  Patient tolerated the procedure well with no immediate complications  Allergies: Zoledronic acid, Duloxetine, Flunisolide, Vilazodone, Latex, and Tape    Assessment / Plan:     Visit Diagnoses: Psoriatic arthropathy (HCC): No synovitis or dactylitis noted on examination today.  No evidence of Achilles tendinitis or plantar fasciitis.  No active psoriasis.  She has clinically been doing well taking Rinvoq  15 mg 1 tablet by mouth daily, methotrexate  6 tablets by mouth once weekly, folic acid  2 mg daily, and Plaquenil  200 mg 1 tab in the morning half tablet in the evening.  She is tolerating combination therapy without any side effects and has not had any recent gaps in therapy.  Overall she has had less difficulty performing ADLs as well as less nocturnal pain on the current treatment regimen.  Her energy level is also improved.  No medication changes will be made at this time.  She was advised to notify us  if she develops signs or symptoms of a flare.  She will follow-up in the office in 5 months or sooner if needed.  Psoriasis: No active psoriasis at this time.  High risk medication use - Rinvoq  15 mg 1 tablet by mouth daily, Methotrexate  6 tablets by mouth once weekly, folic acid  2 mg daily, Plaquenil  200 mg 1 tab in AM, 1/2 tab evening.  CBC and CMP updated on 10/01/23. Her next lab work will be due in September and every 3 months.  Lipid panel updated on 10/01/23.  TB gold negative on 11/19/22.  PLQ Eye Exam: 04/09/2022 WNL at [facility]. Follow up 1 year.  Scheduled for plaquenil  eye exam on 11/04/23.  No recent or recurrent infections.  Discussed the importance of holding rinvoq  and methotrexate  if she  develops signs or symptoms of an infection and to resume once the infection has completely cleared.  - Plan: QuantiFERON-TB Gold Plus  Screening for tuberculosis -Future order for TB gold placed today. Plan:  QuantiFERON-TB Gold Plus  Spondylosis of lumbar spine: Intermittent discomfort.  No symptoms of radiculopathy.  Fibromyalgia: Patient continues to experience intermittent flares of myofascial pain and fatigue typically during stressful times.  She has been going for massages every 2 weeks and has been going to water exercise 3 days a week which has been helpful.  She presents today with increased trapezius muscle tension and tenderness bilaterally.  She has been experiencing more frequent tension headaches.  Bilateral trapezius trigger point injections were performed today.  Trapezius muscle spasm: She has trapezius muscle tension and tenderness bilaterally.  She has been experiencing muscle spasms as well as tension headaches.  Bilateral trapezius trigger point injections were performed today.  She tolerated procedures well.  Procedure notes were completed above.  Aftercare was discussed.  Other fatigue: Chronic, stable.  DDD (degenerative disc disease), cervical: C-spine has limited range of motion without rotation.  She has trapezius muscle tension and tenderness bilaterally.  Bilateral trapezius trigger point injections were performed today in the office.  Procedure notes were completed above.  Aftercare was discussed. Patient plans on continuing to go for maintenance massages every 2 weeks.  Osteopenia of multiple sites - DEXA 11/30/20: BMD 0.617 with T score -2.1. no comparison. Patient was due to update DEXA in August 2024-order in place.  Other medical conditions are listed as follows:   Closed nondisplaced fracture of proximal phalanx of right great toe with routine healing, subsequent encounter  History of fracture of left ankle: No tenderness or inflammation noted today.     Interstitial cystitis  History of gastric bypass  History of diabetes mellitus  History of asthma  History of hypothyroidism  History of depression    Orders: Orders Placed This Encounter  Procedures   Trigger Point Inj   QuantiFERON-TB Gold Plus   No orders of the defined types were placed in this encounter.   Follow-Up Instructions: Return in about 5 months (around 03/28/2024) for Psoriatic arthritis.   Waddell CHRISTELLA Craze, PA-C  Note - This record has been created using Dragon software.  Chart creation errors have been sought, but may not always  have been located. Such creation errors do not reflect on  the standard of medical care.

## 2023-10-27 ENCOUNTER — Ambulatory Visit: Attending: Physician Assistant | Admitting: Physician Assistant

## 2023-10-27 ENCOUNTER — Encounter: Payer: Self-pay | Admitting: Physician Assistant

## 2023-10-27 VITALS — BP 111/71 | HR 76 | Resp 13 | Ht 64.0 in | Wt 148.2 lb

## 2023-10-27 DIAGNOSIS — N3010 Interstitial cystitis (chronic) without hematuria: Secondary | ICD-10-CM

## 2023-10-27 DIAGNOSIS — M47816 Spondylosis without myelopathy or radiculopathy, lumbar region: Secondary | ICD-10-CM

## 2023-10-27 DIAGNOSIS — Z111 Encounter for screening for respiratory tuberculosis: Secondary | ICD-10-CM

## 2023-10-27 DIAGNOSIS — L4050 Arthropathic psoriasis, unspecified: Secondary | ICD-10-CM | POA: Diagnosis not present

## 2023-10-27 DIAGNOSIS — M5030 Other cervical disc degeneration, unspecified cervical region: Secondary | ICD-10-CM

## 2023-10-27 DIAGNOSIS — M8589 Other specified disorders of bone density and structure, multiple sites: Secondary | ICD-10-CM

## 2023-10-27 DIAGNOSIS — M797 Fibromyalgia: Secondary | ICD-10-CM

## 2023-10-27 DIAGNOSIS — L409 Psoriasis, unspecified: Secondary | ICD-10-CM

## 2023-10-27 DIAGNOSIS — Z8781 Personal history of (healed) traumatic fracture: Secondary | ICD-10-CM

## 2023-10-27 DIAGNOSIS — R5383 Other fatigue: Secondary | ICD-10-CM

## 2023-10-27 DIAGNOSIS — Z8639 Personal history of other endocrine, nutritional and metabolic disease: Secondary | ICD-10-CM

## 2023-10-27 DIAGNOSIS — Z79899 Other long term (current) drug therapy: Secondary | ICD-10-CM

## 2023-10-27 DIAGNOSIS — M62838 Other muscle spasm: Secondary | ICD-10-CM | POA: Diagnosis not present

## 2023-10-27 DIAGNOSIS — Z8709 Personal history of other diseases of the respiratory system: Secondary | ICD-10-CM

## 2023-10-27 DIAGNOSIS — S92414D Nondisplaced fracture of proximal phalanx of right great toe, subsequent encounter for fracture with routine healing: Secondary | ICD-10-CM

## 2023-10-27 DIAGNOSIS — Z9884 Bariatric surgery status: Secondary | ICD-10-CM

## 2023-10-27 DIAGNOSIS — Z8659 Personal history of other mental and behavioral disorders: Secondary | ICD-10-CM

## 2023-10-27 MED ORDER — TRIAMCINOLONE ACETONIDE 40 MG/ML IJ SUSP
10.0000 mg | INTRAMUSCULAR | Status: AC | PRN
  Administered 2023-10-27: 10 mg via INTRAMUSCULAR

## 2023-10-27 MED ORDER — LIDOCAINE HCL 1 % IJ SOLN
0.5000 mL | INTRAMUSCULAR | Status: AC | PRN
  Administered 2023-10-27: .5 mL

## 2023-10-27 NOTE — Patient Instructions (Signed)
 Standing Labs We placed an order today for your standing lab work.   Please have your standing labs drawn in September and every 3 months     TB gold due     Please have your labs drawn 2 weeks prior to your appointment so that the provider can discuss your lab results at your appointment, if possible.  Please note that you may see your imaging and lab results in MyChart before we have reviewed them. We will contact you once all results are reviewed. Please allow our office up to 72 hours to thoroughly review all of the results before contacting the office for clarification of your results.  WALK-IN LAB HOURS  Monday through Thursday from 8:00 am -12:30 pm and 1:00 pm-4:30 pm and Friday from 8:00 am-12:00 pm.  Patients with office visits requiring labs will be seen before walk-in labs.  You may encounter longer than normal wait times. Please allow additional time. Wait times may be shorter on  Monday and Thursday afternoons.  We do not book appointments for walk-in labs. We appreciate your patience and understanding with our staff.   Labs are drawn by [facility]. Please bring your co-pay at the time of your lab draw.  You may receive a bill from [facility] for your lab work.  Please note if you are on Hydroxychloroquine  and and an order has been placed for a Hydroxychloroquine  level,  you will need to have it drawn 4 hours or more after your last dose.  If you wish to have your labs drawn at another location, please call the office 24 hours in advance so we can fax the orders.  The office is located at [address], [address], [place], [place] [address]   If you have any questions regarding directions or hours of operation,  please call [phone].   As a reminder, please drink plenty of water prior to coming for your lab work. Thanks!

## 2023-11-04 LAB — HM DIABETES EYE EXAM

## 2023-11-06 ENCOUNTER — Other Ambulatory Visit: Payer: Self-pay | Admitting: Physician Assistant

## 2023-11-06 DIAGNOSIS — L4050 Arthropathic psoriasis, unspecified: Secondary | ICD-10-CM

## 2023-11-06 NOTE — Telephone Encounter (Signed)
 Last Fill: 10/05/2023 (30 day supply)  Eye exam: 04/09/2022 WNL   Contacted [facility], patient had an eye exam on 11/04/2023 , they are going to fax over the results   Labs: 10/01/2023  RBC 3.73 Hemoglobin 12.1 Hematocrit 34.2 Sodium 131 Chloride 96 AST 47  Next Visit: 04/05/2024  Last Visit: 10/27/2023  DX: Psoriatic arthropathy   Current Dose per office note 10/27/2023: Plaquenil  200 mg 1 tab in AM, 1/2 tab evening.   Okay to refill Plaquenil ?

## 2023-11-23 ENCOUNTER — Other Ambulatory Visit: Payer: Self-pay | Admitting: Physician Assistant

## 2023-12-05 ENCOUNTER — Other Ambulatory Visit: Payer: Self-pay | Admitting: Physician Assistant

## 2023-12-05 DIAGNOSIS — L4050 Arthropathic psoriasis, unspecified: Secondary | ICD-10-CM

## 2023-12-07 NOTE — Telephone Encounter (Signed)
 Last Fill: 11/06/2023  Eye exam: 11/04/2023 WNL   Labs: 10/01/2023  RBC 3.73 Hemoglobin 12.1 Hematocrit 34.2 Sodium 131 Chloride 96  Next Visit: 04/05/2024  Last Visit: 10/27/2023  DX: Psoriatic arthropathy   Current Dose per office note 10/27/2023: Plaquenil  200 mg 1 tab in AM, 1/2 tab evening   Okay to refill Plaquenil ?

## 2024-01-04 ENCOUNTER — Other Ambulatory Visit: Payer: Self-pay | Admitting: Physician Assistant

## 2024-01-04 DIAGNOSIS — Z111 Encounter for screening for respiratory tuberculosis: Secondary | ICD-10-CM

## 2024-01-04 DIAGNOSIS — Z79899 Other long term (current) drug therapy: Secondary | ICD-10-CM

## 2024-01-04 DIAGNOSIS — L4050 Arthropathic psoriasis, unspecified: Secondary | ICD-10-CM

## 2024-01-04 DIAGNOSIS — L409 Psoriasis, unspecified: Secondary | ICD-10-CM

## 2024-01-04 NOTE — Telephone Encounter (Signed)
 Last Fill: 09/28/2023  Labs: 11/24/2023 CMP: sodium 133, chloride 96, creatinine 0.58, AST 46 10/01/2023 CBC: RBC 3.73, hemoglobin 12.1, hematocrit 34.2   TB Gold: 11/19/2022 negative    Next Visit: 04/05/2024  Last Visit: 10/27/2023  DX: Psoriatic arthropathy   Current Dose per office note on 10/27/2023: Rinvoq  15 mg 1 tablet by mouth daily   Advised patient she is due to update labs. Orders released for CBC, CMP and TB Gold. Advised patient we will draw CMP along with the CBC and TB Gold to get all labs back on schedule. Patient verbalized understanding. Lab orders have been faxed to PCP's office.   Okay to refill 30 day supply of Rinvoq ?

## 2024-01-05 ENCOUNTER — Other Ambulatory Visit: Payer: Self-pay | Admitting: Physician Assistant

## 2024-01-05 NOTE — Telephone Encounter (Signed)
 Last Fill: 10/05/2023  Labs: 11/24/2023 CMP: sodium 133, chloride 96, creatinine 0.58, AST 46 10/01/2023 CBC: RBC 3.73, hemoglobin 12.1, hematocrit 34.2   Next Visit: 04/05/2024  Last Visit: 10/27/2023  DX: Psoriatic arthropathy   Current Dose per office note on 10/27/2023: Methotrexate  6 tablets by mouth once weekly,   Advised patient yesterday, 01/04/2024 she is due to update labs. Orders released for CBC, CMP and TB Gold. Advised patient we will draw CMP along with the CBC and TB Gold to get all labs back on schedule. Patient verbalized understanding. Lab orders were faxed to PCP's office on 01/04/2024  Okay to refill Methotrexate ?

## 2024-02-02 ENCOUNTER — Other Ambulatory Visit: Payer: Self-pay | Admitting: Physician Assistant

## 2024-02-02 DIAGNOSIS — L4050 Arthropathic psoriasis, unspecified: Secondary | ICD-10-CM

## 2024-02-09 ENCOUNTER — Other Ambulatory Visit: Payer: Self-pay | Admitting: Physician Assistant

## 2024-02-09 DIAGNOSIS — Z79899 Other long term (current) drug therapy: Secondary | ICD-10-CM

## 2024-02-09 DIAGNOSIS — L409 Psoriasis, unspecified: Secondary | ICD-10-CM

## 2024-02-09 DIAGNOSIS — L4050 Arthropathic psoriasis, unspecified: Secondary | ICD-10-CM

## 2024-02-09 NOTE — Telephone Encounter (Signed)
 Last Fill: 01/04/2024 (30 day supply)  Labs: 01/18/2024 Sodium 135, Anion Gap 5, WBC 3.90, RBC 3.89, Neutrophils Absolute 1.60  TB Gold: 01/18/2024 Neg    Next Visit: 04/05/2024  Last Visit: 10/27/2023  IK:Ednmpjupr arthropathy   Current Dose per office note 10/27/2023: Rinvoq  15 mg 1 tablet by mouth daily   Okay to refill Rinvoq ?

## 2024-03-04 ENCOUNTER — Other Ambulatory Visit: Payer: Self-pay | Admitting: Physician Assistant

## 2024-03-04 DIAGNOSIS — L4050 Arthropathic psoriasis, unspecified: Secondary | ICD-10-CM

## 2024-03-04 NOTE — Telephone Encounter (Signed)
 Last Fill: 12/07/2023  Eye exam: 11/04/2023 WNL   Labs: 01/18/2024 WBC 3.90 RBC 3.89 Neutrophils Absolute 1.60 Sodium 135 Anion Gap 5  Next Visit: 04/05/2024  Last Visit: 10/27/2023  IK:Ednmpjupr arthropathy (HCC)   Current Dose per office note 10/27/2023: Plaquenil  200 mg 1 tab in AM, 1/2 tab evening.   Okay to refill Plaquenil ?

## 2024-03-04 NOTE — Telephone Encounter (Signed)
 Last Fill: 01/05/2024  Labs: 01/18/2024 WBC 3.90 RBC 3.89 Neutrophils Absolute 1.60 Sodium 135 Anion Gap 5  Next Visit: 04/05/2024  Last Visit: 10/27/2023  DX: Psoriatic arthropathy (HCC)   Current Dose per office note 10/27/2023: Methotrexate  6 tablets by mouth once weekly   Okay to refill Methotrexate ?

## 2024-03-04 NOTE — Telephone Encounter (Signed)
 Last Fill: 12/07/2023  Eye exam: 11/04/2023 WNL    Labs: 01/18/2024 Sodium 135, Anion Gap 5, RBC 3.90, RBC 3.89, Neutrophils Absolute 1.60  Next Visit: 04/05/2024  Last Visit: 10/27/2023  IK:Ednmpjupr arthropathy   Current Dose per office note 10/27/2023: Plaquenil  200 mg 1 tab in AM, 1/2 tab evening   Okay to refill Plaquenil ?

## 2024-03-23 NOTE — Progress Notes (Deleted)
 Office Visit Note  Patient: Michelle [name]             Date of Birth: Mar 28, 1976           MRN: [id number]             PCP: Karie Norleen BROCKS., MD Referring: Karie Norleen BROCKS., MD Visit Date: 04/05/2024 Occupation: Data Unavailable  Subjective:  No chief complaint on file.   History of Present Illness: Michelle [name] is a 48 y.o. female ***     Activities of Daily Living:  Patient reports morning stiffness for *** {minute/hour:19697}.   Patient {ACTIONS;DENIES/REPORTS:21021675::Denies} nocturnal pain.  Difficulty dressing/grooming: {ACTIONS;DENIES/REPORTS:21021675::Denies} Difficulty climbing stairs: {ACTIONS;DENIES/REPORTS:21021675::Denies} Difficulty getting out of chair: {ACTIONS;DENIES/REPORTS:21021675::Denies} Difficulty using hands for taps, buttons, cutlery, and/or writing: {ACTIONS;DENIES/REPORTS:21021675::Denies}  No Rheumatology ROS completed.   PMFS History:  Patient Active Problem List   Diagnosis Date Noted  . Interstitial cystitis 11/24/2016  . High risk medication use 11/02/2016  . History of depression 11/02/2016  . History of asthma 11/02/2016  . Prolonged QT interval 11/02/2016  . History of gastric bypass 11/02/2016  . History of hypothyroidism 11/02/2016  . History of renal calcinosis 11/02/2016  . Psoriatic arthropathy (HCC) 04/17/2016  . Psoriasis 04/17/2016  . ANA positive 04/17/2016  . Other fatigue 04/17/2016  . DDD (degenerative disc disease), cervical 04/17/2016  . DDD (degenerative disc disease), lumbar 04/17/2016  . History of diabetes mellitus 04/17/2016  . History of migraine 04/17/2016  . Fatty liver 04/17/2016  . Diabetes (HCC)   . Depression   . Fibromyalgia   . Muscle cramps     Past Medical History:  Diagnosis Date  . Broken ankle   . Depression   . Diabetes (HCC)   . Fibromyalgia   . Migraine   . Muscle cramps   . Psoriasis     Family History  Problem Relation Age of Onset  . Cancer Mother   . Cancer  Father   . Diabetes Sister   . Cancer Brother    Past Surgical History:  Procedure Laterality Date  . ABDOMINAL HYSTERECTOMY    . CESAREAN SECTION    . CHOLECYSTECTOMY    . FACET JOINT INJECTION    . GASTRIC BYPASS    . radiofrequency injections     neck, pain management did injections  . SI Joint Injection    . tummy tuck  03/24/2018   Social History   Tobacco Use  . Smoking status: Never    Passive exposure: Past  . Smokeless tobacco: Never  Vaping Use  . Vaping status: Never Used  Substance Use Topics  . Alcohol use: No  . Drug use: Never   Social History   Social History Narrative   Patient lives at home alone with her husband and she works full time disability .    Right handed.   Caffeine one times per day.     Immunization History  Administered Date(s) Administered  . Influenza Inj Mdck Quad Pf 02/13/2017  . Influenza,inj,Quad PF,6+ Mos 01/23/2016, 01/27/2018  . Influenza-Unspecified 01/23/2016  . Moderna Sars-Covid-2 Vaccination 04/17/2019, 05/21/2019, 07/24/2020  . Pneumococcal Polysaccharide-23 01/20/2012  . Tdap 07/28/2012     Objective: Vital Signs: There were no vitals taken for this visit.   Physical Exam   Musculoskeletal Exam: ***  CDAI Exam: CDAI Score: -- Patient Global: --; Provider Global: -- Swollen: --; Tender: -- Joint Exam 04/05/2024   No joint exam has been documented for this visit   There is currently  no information documented on the homunculus. Go to the Rheumatology activity and complete the homunculus joint exam.  Investigation: No additional findings.  Imaging: No results found.  Recent Labs: Lab Results  Component Value Date   WBC 4.2 04/23/2023   HGB 11.5 (L) 04/23/2023   PLT 254 04/23/2023   NA 133 (L) 04/23/2023   K 5.2 04/23/2023   CL 98 04/23/2023   CO2 29 04/23/2023   GLUCOSE 63 (L) 04/23/2023   BUN 12 04/23/2023   CREATININE 0.64 04/23/2023   BILITOT 0.3 04/23/2023   ALKPHOS 113 10/29/2016    AST 27 04/23/2023   ALT 28 04/23/2023   PROT 6.6 04/23/2023   ALBUMIN 4.3 10/29/2016   CALCIUM 9.2 04/23/2023   GFRAA 128 01/10/2019   QFTBGOLDPLUS NEGATIVE 11/19/2022    Speciality Comments: PLQ Eye Exam: 11/04/2023 WNL @ [facility].     MTX + plaquenil  in combination with biologics  Humira (waning response) -> Enbrel  (stopped 11/17/22); Taltz  started 11/27/22 (stopped 12/25/22 due to diarrhea) -> Simponi  SQ started 01/26/23 (rash and BP elevation) -> Rinvoq   Procedures:  No procedures performed Allergies: Zoledronic acid, Duloxetine, Flunisolide, Vilazodone, Latex, and Tape   Assessment / Plan:     Visit Diagnoses: No diagnosis found.  Orders: No orders of the defined types were placed in this encounter.  No orders of the defined types were placed in this encounter.   Face-to-face time spent with patient was *** minutes. Greater than 50% of time was spent in counseling and coordination of care.  Follow-Up Instructions: No follow-ups on file.   Daved JAYSON Gavel, CMA  Note - This record has been created using Animal nutritionist.  Chart creation errors have been sought, but may not always  have been located. Such creation errors do not reflect on  the standard of medical care.

## 2024-04-01 ENCOUNTER — Other Ambulatory Visit: Payer: Self-pay | Admitting: Physician Assistant

## 2024-04-01 NOTE — Telephone Encounter (Signed)
 Last Fill: 04/09/2023  Next Visit: 04/05/2024  Last Visit: 10/27/2023  Dx: Psoriatic arthropathy   Current Dose per office note on 10/27/2023: folic acid  2 mg daily   Okay to refill Folic Acid ?

## 2024-04-05 ENCOUNTER — Ambulatory Visit: Admitting: Rheumatology

## 2024-04-05 DIAGNOSIS — L409 Psoriasis, unspecified: Secondary | ICD-10-CM

## 2024-04-05 DIAGNOSIS — M47816 Spondylosis without myelopathy or radiculopathy, lumbar region: Secondary | ICD-10-CM

## 2024-04-05 DIAGNOSIS — M797 Fibromyalgia: Secondary | ICD-10-CM

## 2024-04-05 DIAGNOSIS — M62838 Other muscle spasm: Secondary | ICD-10-CM

## 2024-04-05 DIAGNOSIS — Z8781 Personal history of (healed) traumatic fracture: Secondary | ICD-10-CM

## 2024-04-05 DIAGNOSIS — Z79899 Other long term (current) drug therapy: Secondary | ICD-10-CM

## 2024-04-05 DIAGNOSIS — Z8659 Personal history of other mental and behavioral disorders: Secondary | ICD-10-CM

## 2024-04-05 DIAGNOSIS — S92414D Nondisplaced fracture of proximal phalanx of right great toe, subsequent encounter for fracture with routine healing: Secondary | ICD-10-CM

## 2024-04-05 DIAGNOSIS — R5383 Other fatigue: Secondary | ICD-10-CM

## 2024-04-05 DIAGNOSIS — Z8709 Personal history of other diseases of the respiratory system: Secondary | ICD-10-CM

## 2024-04-05 DIAGNOSIS — M8589 Other specified disorders of bone density and structure, multiple sites: Secondary | ICD-10-CM

## 2024-04-05 DIAGNOSIS — Z8639 Personal history of other endocrine, nutritional and metabolic disease: Secondary | ICD-10-CM

## 2024-04-05 DIAGNOSIS — M5030 Other cervical disc degeneration, unspecified cervical region: Secondary | ICD-10-CM

## 2024-04-05 DIAGNOSIS — L4050 Arthropathic psoriasis, unspecified: Secondary | ICD-10-CM

## 2024-04-05 DIAGNOSIS — N3010 Interstitial cystitis (chronic) without hematuria: Secondary | ICD-10-CM

## 2024-04-05 DIAGNOSIS — Z9884 Bariatric surgery status: Secondary | ICD-10-CM

## 2024-05-09 ENCOUNTER — Other Ambulatory Visit: Payer: Self-pay | Admitting: Physician Assistant

## 2024-05-09 DIAGNOSIS — Z79899 Other long term (current) drug therapy: Secondary | ICD-10-CM

## 2024-05-09 DIAGNOSIS — L409 Psoriasis, unspecified: Secondary | ICD-10-CM

## 2024-05-09 DIAGNOSIS — L4050 Arthropathic psoriasis, unspecified: Secondary | ICD-10-CM

## 2024-05-09 NOTE — Telephone Encounter (Signed)
 Last Fill: 02/09/2024  Labs: 01/18/2024 Sodium 135, Anion gap 5, WBC 3.90, RBC 3.89, Neutrophils Absolute 1.60  TB Gold: 01/18/2024 Neg    Next Visit: 05/24/2024  Last Visit: 10/27/2023  IK:Ednmpjupr arthropathy   Current Dose per office note 10/27/2023:  Rinvoq  15 mg 1 tablet by mouth daily   Patient to update labs at upcoming appointment on 05/24/2024  Okay to refill Rinvoq ?

## 2024-05-10 NOTE — Progress Notes (Unsigned)
 "  Office Visit Note  Patient: Michelle [name]             Date of Birth: 07/28/1975           MRN: [id number]             PCP: Karie Norleen BROCKS., MD Referring: Karie Norleen BROCKS., MD Visit Date: 05/24/2024 Occupation: Data Unavailable  Subjective:  No chief complaint on file.   History of Present Illness: Michelle [name] is a 49 y.o. female ***     Activities of Daily Living:  Patient reports morning stiffness for *** {minute/hour:19697}.   Patient {ACTIONS;DENIES/REPORTS:21021675::Denies} nocturnal pain.  Difficulty dressing/grooming: {ACTIONS;DENIES/REPORTS:21021675::Denies} Difficulty climbing stairs: {ACTIONS;DENIES/REPORTS:21021675::Denies} Difficulty getting out of chair: {ACTIONS;DENIES/REPORTS:21021675::Denies} Difficulty using hands for taps, buttons, cutlery, and/or writing: {ACTIONS;DENIES/REPORTS:21021675::Denies}  No Rheumatology ROS completed.   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   Psoriatic arthropathy (HCC) 04/17/2016   Psoriasis 04/17/2016   ANA positive 04/17/2016   Other fatigue 04/17/2016   DDD (degenerative disc disease), cervical 04/17/2016   DDD (degenerative disc disease), lumbar 04/17/2016   History of diabetes mellitus 04/17/2016   History of migraine 04/17/2016   Fatty liver 04/17/2016   Diabetes (HCC)    Depression    Fibromyalgia    Muscle cramps     Past Medical History:  Diagnosis Date   Broken ankle    Depression    Diabetes (HCC)    Fibromyalgia    Migraine    Muscle cramps    Psoriasis     Family History  Problem Relation Age of Onset   Cancer Mother    Cancer Father    Diabetes Sister     Cancer Brother    Past Surgical History:  Procedure Laterality Date   ABDOMINAL HYSTERECTOMY     CESAREAN SECTION     CHOLECYSTECTOMY     FACET JOINT INJECTION     GASTRIC BYPASS     radiofrequency injections     neck, pain management did injections   SI Joint Injection     tummy tuck  03/24/2018   Social History[1] Social History   Social History Narrative   Patient lives at home alone with her husband and she works full time disability .    Right handed.   Caffeine one times per day.     Immunization History  Administered Date(s) Administered   Influenza Inj Mdck Quad Pf 02/13/2017   Influenza,inj,Quad PF,6+ Mos 01/23/2016, 01/27/2018   Influenza-Unspecified 01/23/2016   Moderna Sars-Covid-2 Vaccination 04/17/2019, 05/21/2019, 07/24/2020   Pneumococcal Polysaccharide-23 01/20/2012   Tdap 07/28/2012     Objective: Vital Signs: There were no vitals taken for this visit.   Physical Exam   Musculoskeletal Exam: ***  CDAI Exam: CDAI Score: -- Patient Global: --; Provider Global: -- Swollen: --; Tender: -- Joint Exam 05/24/2024   No joint exam has been documented for this visit   There is currently no information documented on the homunculus. Go to the Rheumatology activity and complete the homunculus joint exam.  Investigation: No additional findings.  Imaging: No results found.  Recent Labs: Lab Results  Component Value Date   WBC 4.2 04/23/2023   HGB  11.5 (L) 04/23/2023   PLT 254 04/23/2023   NA 133 (L) 04/23/2023   K 5.2 04/23/2023   CL 98 04/23/2023   CO2 29 04/23/2023   GLUCOSE 63 (L) 04/23/2023   BUN 12 04/23/2023   CREATININE 0.64 04/23/2023   BILITOT 0.3 04/23/2023   ALKPHOS 113 10/29/2016   AST 27 04/23/2023   ALT 28 04/23/2023   PROT 6.6 04/23/2023   ALBUMIN 4.3 10/29/2016   CALCIUM 9.2 04/23/2023   GFRAA 128 01/10/2019   QFTBGOLDPLUS NEGATIVE 11/19/2022    Speciality Comments: PLQ Eye Exam: 11/04/2023 WNL @ [facility].      MTX + plaquenil  in combination with biologics  Humira (waning response) -> Enbrel  (stopped 11/17/22); Taltz  started 11/27/22 (stopped 12/25/22 due to diarrhea) -> Simponi  SQ started 01/26/23 (rash and BP elevation) -> Rinvoq   Procedures:  No procedures performed Allergies: Zoledronic acid, Duloxetine, Flunisolide, Vilazodone, Latex, and Tape   Assessment / Plan:     Visit Diagnoses: Psoriatic arthropathy (HCC)  Psoriasis  High risk medication use  Spondylosis of lumbar spine  Fibromyalgia  Trapezius muscle spasm  Other fatigue  DDD (degenerative disc disease), cervical  Osteopenia of multiple sites  Closed nondisplaced fracture of proximal phalanx of right great toe with routine healing, subsequent encounter  History of fracture of left ankle  Interstitial cystitis  History of gastric bypass  History of diabetes mellitus  History of hypothyroidism  History of asthma  History of depression  Orders: No orders of the defined types were placed in this encounter.  No orders of the defined types were placed in this encounter.   Face-to-face time spent with patient was *** minutes. Greater than 50% of time was spent in counseling and coordination of care.  Follow-Up Instructions: No follow-ups on file.   Waddell CHRISTELLA Craze, PA-C  Note - This record has been created using Dragon software.  Chart creation errors have been sought, but may not always  have been located. Such creation errors do not reflect on  the standard of medical care.     [1]  Social History Tobacco Use   Smoking status: Never    Passive exposure: Past   Smokeless tobacco: Never  Vaping Use   Vaping status: Never Used  Substance Use Topics   Alcohol use: No   Drug use: Never   "

## 2024-05-11 ENCOUNTER — Other Ambulatory Visit: Payer: Self-pay | Admitting: Physician Assistant

## 2024-05-11 DIAGNOSIS — L4050 Arthropathic psoriasis, unspecified: Secondary | ICD-10-CM

## 2024-05-11 NOTE — Telephone Encounter (Signed)
 Last Fill: 03/06/2024  Eye exam: 11/04/2023 WNL   Labs: 01/18/2024 Sodium 135 Anion Gap 5 WBC 3.90 RBC 3.89 Neutrophils Abs 1.60  Next Visit: 05/24/2024  Last Visit: 10/27/2023  DX: Psoriatic arthropathy   Current Dose per office note 10/27/2023: Plaquenil  200 mg 1 tab in AM, 1/2 tab evening   Okay to refill Plaquenil ?

## 2024-05-24 ENCOUNTER — Ambulatory Visit: Admitting: Physician Assistant

## 2024-05-24 DIAGNOSIS — Z8709 Personal history of other diseases of the respiratory system: Secondary | ICD-10-CM

## 2024-05-24 DIAGNOSIS — M8589 Other specified disorders of bone density and structure, multiple sites: Secondary | ICD-10-CM

## 2024-05-24 DIAGNOSIS — N3010 Interstitial cystitis (chronic) without hematuria: Secondary | ICD-10-CM

## 2024-05-24 DIAGNOSIS — L409 Psoriasis, unspecified: Secondary | ICD-10-CM

## 2024-05-24 DIAGNOSIS — Z9884 Bariatric surgery status: Secondary | ICD-10-CM

## 2024-05-24 DIAGNOSIS — M62838 Other muscle spasm: Secondary | ICD-10-CM

## 2024-05-24 DIAGNOSIS — S92414D Nondisplaced fracture of proximal phalanx of right great toe, subsequent encounter for fracture with routine healing: Secondary | ICD-10-CM

## 2024-05-24 DIAGNOSIS — Z8781 Personal history of (healed) traumatic fracture: Secondary | ICD-10-CM

## 2024-05-24 DIAGNOSIS — L4050 Arthropathic psoriasis, unspecified: Secondary | ICD-10-CM

## 2024-05-24 DIAGNOSIS — Z8639 Personal history of other endocrine, nutritional and metabolic disease: Secondary | ICD-10-CM

## 2024-05-24 DIAGNOSIS — Z79899 Other long term (current) drug therapy: Secondary | ICD-10-CM

## 2024-05-24 DIAGNOSIS — M5030 Other cervical disc degeneration, unspecified cervical region: Secondary | ICD-10-CM

## 2024-05-24 DIAGNOSIS — Z8659 Personal history of other mental and behavioral disorders: Secondary | ICD-10-CM

## 2024-05-24 DIAGNOSIS — M797 Fibromyalgia: Secondary | ICD-10-CM

## 2024-05-24 DIAGNOSIS — R5383 Other fatigue: Secondary | ICD-10-CM

## 2024-05-24 DIAGNOSIS — M47816 Spondylosis without myelopathy or radiculopathy, lumbar region: Secondary | ICD-10-CM

## 2024-07-15 ENCOUNTER — Ambulatory Visit: Admitting: Physician Assistant
